# Patient Record
Sex: Female | Born: 1950
Health system: Southern US, Community
[De-identification: ages and names within clinical notes are randomized; demographics above are authoritative.]

## PROBLEM LIST (undated history)

## (undated) DIAGNOSIS — K635 Polyp of colon: Secondary | ICD-10-CM

## (undated) DIAGNOSIS — Z9889 Other specified postprocedural states: Secondary | ICD-10-CM

## (undated) DIAGNOSIS — R112 Nausea with vomiting, unspecified: Secondary | ICD-10-CM

## (undated) DIAGNOSIS — G4700 Insomnia, unspecified: Secondary | ICD-10-CM

## (undated) DIAGNOSIS — D3910 Neoplasm of uncertain behavior of unspecified ovary: Secondary | ICD-10-CM

## (undated) DIAGNOSIS — R7989 Other specified abnormal findings of blood chemistry: Secondary | ICD-10-CM

## (undated) DIAGNOSIS — K5790 Diverticulosis of intestine, part unspecified, without perforation or abscess without bleeding: Secondary | ICD-10-CM

## (undated) DIAGNOSIS — N950 Postmenopausal bleeding: Secondary | ICD-10-CM

## (undated) DIAGNOSIS — F419 Anxiety disorder, unspecified: Secondary | ICD-10-CM

## (undated) HISTORY — PX: FINGER SURGERY: SHX640

## (undated) HISTORY — DX: Polyp of colon: K63.5

## (undated) HISTORY — DX: Neoplasm of uncertain behavior of unspecified ovary: D39.10

## (undated) HISTORY — DX: Other specified abnormal findings of blood chemistry: R79.89

## (undated) HISTORY — DX: Insomnia, unspecified: G47.00

## (undated) HISTORY — DX: Diverticulosis of intestine, part unspecified, without perforation or abscess without bleeding: K57.90

## (undated) HISTORY — DX: Postmenopausal bleeding: N95.0

---

## 2000-08-11 ENCOUNTER — Encounter: Admission: RE | Admit: 2000-08-11 | Discharge: 2000-08-11 | Payer: Self-pay | Admitting: Internal Medicine

## 2000-08-11 ENCOUNTER — Encounter: Payer: Self-pay | Admitting: Internal Medicine

## 2000-09-23 ENCOUNTER — Encounter (INDEPENDENT_AMBULATORY_CARE_PROVIDER_SITE_OTHER): Payer: Self-pay

## 2000-09-23 ENCOUNTER — Ambulatory Visit (HOSPITAL_COMMUNITY): Admission: RE | Admit: 2000-09-23 | Discharge: 2000-09-23 | Payer: Self-pay | Admitting: Gastroenterology

## 2001-11-03 ENCOUNTER — Other Ambulatory Visit: Admission: RE | Admit: 2001-11-03 | Discharge: 2001-11-03 | Payer: Self-pay | Admitting: *Deleted

## 2002-11-16 ENCOUNTER — Other Ambulatory Visit: Admission: RE | Admit: 2002-11-16 | Discharge: 2002-11-16 | Payer: Self-pay | Admitting: Obstetrics and Gynecology

## 2010-04-18 ENCOUNTER — Encounter: Admission: RE | Admit: 2010-04-18 | Discharge: 2010-04-18 | Payer: Self-pay | Admitting: Obstetrics and Gynecology

## 2010-07-15 DIAGNOSIS — G4700 Insomnia, unspecified: Secondary | ICD-10-CM

## 2010-07-15 HISTORY — DX: Insomnia, unspecified: G47.00

## 2010-08-29 ENCOUNTER — Ambulatory Visit (INDEPENDENT_AMBULATORY_CARE_PROVIDER_SITE_OTHER): Payer: 59 | Admitting: Family Medicine

## 2010-08-29 DIAGNOSIS — M542 Cervicalgia: Secondary | ICD-10-CM

## 2010-08-29 DIAGNOSIS — G4700 Insomnia, unspecified: Secondary | ICD-10-CM

## 2010-11-30 NOTE — Procedures (Signed)
[facility]  Patient:    Kristie Howell, Kristie Howell                         MRN: [id number] Proc. Date: 09/23/00 Adm. Date:  [id number] Attending:  Nelda Marseille CC:         Luanna Cole. Lenord Fellers, M.D.   Procedure Report  PROCEDURE PERFORMED:  Colonoscopy.  ENDOSCOPIST:  Petra Kuba, M.D.  INDICATIONS:  Family history of colon cancer in a patient with probable IBS. Consent was signed after risks, benefits, and options were thoroughly discussed in the office.  MEDICATIONS:  Fentanyl 100 mcg and Versed 12 mg.  DESCRIPTION OF PROCEDURE:  Rectal inspection is pertinent for external hemorrhoids.  Digital exam was negative.  Video pediatric colonoscope was inserted and with moderate difficulty due to a tortuous colon was able to be advanced to the cecum.  No obvious abnormality was seen on insertion.  To advance to the cecum required rolling her on her back and then back on her left side and then back on her back again, and multiple abdominal pressures. The cecum was identified by the appendiceal orifice and the ileocecal valve. The scope was inserted a short ways in the terminal ileum, which was normal. Photo documentation was obtained.  The scope was then slowly withdrawn through the colon.  The prep was adequate.  There was minimal liquid stool that required suctioning only.  When we fell back around the tortuous loop, we did try to readvance around the curves, slowly withdrawal to the rectum.  No obvious abnormalities were seen except for in the rectum a tiny hypoplastic appearing polyp was seen and cold biopsied x 2.  Once back in the rectum, the scope was retroflexed and pertinent for some internal hemorrhoids.  The scope was drained and readvanced a short ways around the sigmoid area, was suctioned, and the scope removed.  The patient tolerated the procedure well. There was no obvious immediate complications.  ENDOSCOPIC DIAGNOSES:  1. Internal and external  hemorrhoids.  2. Pending rectal polyp probable hyperplastic status post cold biopsy.  3. Tortuous colon.  4. Otherwise within normal limits to the terminal ileum.  PLAN:  1. Await pathology.  2. Probably recheck colon screening in five years. 2. Gastrointestinal follow up p.r.n.  3. Will wait and see how the Robinul works.  4. Yearly rectals and guaiacs per Dr. Lenord Fellers or oncologist as well as other health care maintenance. DD:  09/23/00 TD:  09/24/00 Job: [id number]

## 2010-12-05 ENCOUNTER — Telehealth: Payer: Self-pay | Admitting: Family Medicine

## 2010-12-05 NOTE — Telephone Encounter (Signed)
Since this is a controlled substance and can't be e-rx'd, please fax back the signed form on the chart.  Thanks Ok for #20, no refills

## 2010-12-06 NOTE — Telephone Encounter (Signed)
FAXED REFILL FORM TO [facility]

## 2011-01-13 LAB — HM COLONOSCOPY

## 2011-02-13 DIAGNOSIS — K5790 Diverticulosis of intestine, part unspecified, without perforation or abscess without bleeding: Secondary | ICD-10-CM

## 2011-02-13 DIAGNOSIS — K635 Polyp of colon: Secondary | ICD-10-CM

## 2011-02-13 HISTORY — DX: Polyp of colon: K63.5

## 2011-02-13 HISTORY — DX: Diverticulosis of intestine, part unspecified, without perforation or abscess without bleeding: K57.90

## 2011-03-12 ENCOUNTER — Other Ambulatory Visit: Payer: Self-pay | Admitting: Family Medicine

## 2011-03-12 DIAGNOSIS — G4700 Insomnia, unspecified: Secondary | ICD-10-CM

## 2011-03-13 MED ORDER — ZOLPIDEM TARTRATE 10 MG PO TABS: ORAL_TABLET | ORAL | Status: DC

## 2011-03-13 NOTE — Telephone Encounter (Signed)
Ok for #20, rf x 1 to [facility].  Please call in and sign Rx order

## 2011-03-13 NOTE — Telephone Encounter (Signed)
Called in zolpidem tartrate 10mg  #20 1/2-1 tablet @ bedtime prn with 1 RF to [facility] @ [phone].

## 2011-10-08 ENCOUNTER — Ambulatory Visit (INDEPENDENT_AMBULATORY_CARE_PROVIDER_SITE_OTHER): Payer: PRIVATE HEALTH INSURANCE | Admitting: Obstetrics and Gynecology

## 2011-10-08 ENCOUNTER — Encounter: Payer: Self-pay | Admitting: Obstetrics and Gynecology

## 2011-10-08 VITALS — BP 120/76 | Ht 65.0 in | Wt 130.0 lb

## 2011-10-08 DIAGNOSIS — N950 Postmenopausal bleeding: Secondary | ICD-10-CM

## 2011-10-08 DIAGNOSIS — Z01419 Encounter for gynecological examination (general) (routine) without abnormal findings: Secondary | ICD-10-CM

## 2011-10-08 NOTE — Progress Notes (Signed)
Patient is a 61 year old gravida 2 para 2 AB 0 who came to see me today as a new patient because of postmenopausal bleeding. In 2010 she started menopause. In January of 2011 because of significant symptoms Dr. Rosalio Macadamia started her on a Vivelle dot patch 0.05 mg twice a week and Prometrium 100 mg daily. All her symptoms were resolved and she had no bleeding. 18 months later she had some abnormal bleeding. She went to the office and had a normal ultrasound with a thin lining. She continued to have intermittent spotting and she had a followup ultrasound in January of 2013. At this point she was having fairly significant cramping not always associated with the bleeding. Ultrasound was still normal and she still had a thin endometrial stripe. When the bleeding and cramping persisted 2 weeks ago she was switched to a 0.0375 mg Vivelle patch and she has noticed significant improvement in the bleeding. She's never had an endometrial biopsy and she's never had a saline infusion histogram. She says the new patches okay but not as good as the old ones. She is also having more vaginal dryness. [facility] is sending her ultrasounds to me.   Physical examination: Kennon Portela present. HEENT within normal limits. Neck: Thyroid not large. No masses. Supraclavicular nodes: not enlarged. Breasts: Examined in both sitting and lying  position. No skin changes and no masses. Abdomen: Soft no guarding rebound or masses or hernia. Pelvic: External: Within normal limits. BUS: Within normal limits. Vaginal:within normal limits. Good estrogen effect. No evidence of cystocele rectocele or enterocele. Cervix: clean. Uterus: Normal size and shape. Adnexa: No masses. Rectovaginal exam: Confirmatory and negative. Extremities: Within normal limits.  Assessment: Persistent postmenopausal bleeding  Plan: We attempted to do an endometrial biopsy. Patient has a severely retroverted uterus and his had cesarean sections for her  deliveries and we were not successful. We plan to have her return for saline infusion histogram with endometrial biopsy using a paracervical block. She will take 2 naprelan (1000mg m) before she calms. Other option giving to her was hysteroscopy D&C at the surgical center. We also discussed if the bleeding persists on the  lower dose or she feels she has to go to the higher dose of estrogen because of symptoms using a Mirena IUD or doing an endometrial ablation. She will let me know how she wants to proceed.

## 2011-10-09 LAB — URINALYSIS W MICROSCOPIC + REFLEX CULTURE
Bacteria, UA: NONE SEEN
Bilirubin Urine: NEGATIVE
Casts: NONE SEEN
Glucose, UA: NEGATIVE mg/dL
Ketones, ur: NEGATIVE mg/dL
Nitrite: NEGATIVE
Protein, ur: NEGATIVE mg/dL
Specific Gravity, Urine: 1.018 (ref 1.005–1.030)
Urobilinogen, UA: 0.2 mg/dL (ref 0.0–1.0)
pH: 6.5 (ref 5.0–8.0)

## 2011-10-10 LAB — URINE CULTURE
Colony Count: NO GROWTH
Organism ID, Bacteria: NO GROWTH

## 2011-10-28 ENCOUNTER — Other Ambulatory Visit: Payer: Self-pay | Admitting: *Deleted

## 2011-10-28 ENCOUNTER — Telehealth: Payer: Self-pay | Admitting: *Deleted

## 2011-10-28 DIAGNOSIS — N950 Postmenopausal bleeding: Secondary | ICD-10-CM

## 2011-10-28 NOTE — Telephone Encounter (Signed)
Pt called to have [facility]  And endometrial biopsy schedule, pt transferred to appointment desk.

## 2011-10-30 ENCOUNTER — Encounter: Payer: Self-pay | Admitting: Family Medicine

## 2011-10-30 ENCOUNTER — Ambulatory Visit (INDEPENDENT_AMBULATORY_CARE_PROVIDER_SITE_OTHER): Payer: PRIVATE HEALTH INSURANCE | Admitting: Family Medicine

## 2011-10-30 VITALS — BP 108/70 | HR 72 | Ht 64.0 in | Wt 130.0 lb

## 2011-10-30 DIAGNOSIS — M79609 Pain in unspecified limb: Secondary | ICD-10-CM

## 2011-10-30 DIAGNOSIS — R300 Dysuria: Secondary | ICD-10-CM

## 2011-10-30 DIAGNOSIS — M545 Low back pain, unspecified: Secondary | ICD-10-CM

## 2011-10-30 DIAGNOSIS — G4700 Insomnia, unspecified: Secondary | ICD-10-CM | POA: Insufficient documentation

## 2011-10-30 DIAGNOSIS — M79671 Pain in right foot: Secondary | ICD-10-CM

## 2011-10-30 LAB — POCT URINALYSIS DIPSTICK
Glucose, UA: NEGATIVE
Ketones, UA: NEGATIVE
Spec Grav, UA: 1.01

## 2011-10-30 MED ORDER — NAPROXEN 500 MG PO TABS: 500.0000 mg | ORAL_TABLET | Freq: Two times a day (BID) | ORAL | Status: DC

## 2011-10-30 MED ORDER — ZOLPIDEM TARTRATE 10 MG PO TABS: ORAL_TABLET | ORAL | Status: DC

## 2011-10-30 NOTE — Patient Instructions (Signed)
Take anti-inflammatory medication twice daily regularly until pain has resolved.  Heat and stretches will help.  You may use tylenol, but no other pain medications while taking the prescription.  Cut back on dose if bothering your stomach, and ensure to take it with food.  If having ongoing back pain, consider physical therapy or chiropractor (I have used Dr. Thereasa Distance at [facility] on [address] near [place])  Foot pain is not likely related to your back pain.  Supportive shoes with good arch supports.  Consider seeing podiatrist if ongoing foot pain ([facility]).   Sacroiliac Joint Dysfunction The sacroiliac joint connects the lower part of the spine (the sacrum) with the bones of the pelvis. CAUSES  Sometimes, there is no obvious reason for sacroiliac joint dysfunction. Other times, it may occur   During pregnancy.   After injury, such as:   Car accidents.   Sport-related injuries.   Work-related injuries.   Due to one leg being shorter than the other.   Due to other conditions that affect the joints, such as:   Rheumatoid arthritis.   Gout.   Psoriasis.   Joint infection (septic arthritis).  SYMPTOMS  Symptoms may include:  Pain in the:   Lower back.   Buttocks.   Groin.   Thighs and legs.   Difficult sitting, standing, walking, lying, bending or lifting.  DIAGNOSIS  A number of tests may be used to help diagnose the cause of sacroiliac joint dysfunction, including:  Imaging tests to look for other causes of pain, including:   MRI.   CT scan.   Bone scan.   Diagnostic injection: During a special x-ray (called fluoroscopy), a needle is put into the sacroiliac joint. A numbing medicine is injected into the joint. If the pain is improved or stopped, the diagnosis of sacroiliac joint dysfunction is more likely.  TREATMENT  There are a number of types of treatment used for sacroiliac joint dysfunction,  including:  Only take over-the-counter or prescription medicines for pain, discomfort, or fever as directed by your caregiver.   Medications to relax muscles.   Rest. Decreasing activity can help cut down on painful muscle spasms and allow the back to heal.   Application of heat or ice to the lower back may improve muscle spasms and soothe pain.   Brace. A special back brace, called a sacroiliac belt, can help support the joint while your back is healing.   Physical therapy can help teach comfortable positions and exercises to strengthen muscles that support the sacroiliac joint.   Cortisone injections. Injections of steroid medicine into the joint can help decrease swelling and improve pain.   Hyaluronic acid injections. This chemical improves lubrication within the sacroiliac joint, thereby decreasing pain.   Radiofrequency ablation. A special needle is placed into the joint, where it burns away nerves that are carrying pain messages from the joint.   Surgery. Because pain occurs during movement of the joint, screws and plates may be installed in order to limit or prevent joint motion.  HOME CARE INSTRUCTIONS   Take all medications exactly as directed.   Follow instructions regarding both rest and physical activity, to avoid worsening the pain.   Do physical therapy exercises exactly as prescribed.  SEEK IMMEDIATE MEDICAL CARE IF:  You experience increasingly severe pain.   You develop new symptoms, such as numbness or tingling in your legs or feet.   You lose bladder or bowel control.  Document Released:  09/27/2008 Document Revised: 06/20/2011 Document Reviewed: 09/27/2008 [facility] Patient Information 2012 [place], [place].

## 2011-10-30 NOTE — Progress Notes (Signed)
Chief complaint:  Foot pain --patient thinks may be caused by sciatic nerve, mainly pain in her right foot but also experiences some right hip pain at times. Also stated she has some burning upon urination. Would like ambien refill  HPI:  Complaining of R hip/buttock pain on/off since January.  Sometimes the pain shoots anteriorly to hip.  Pain is worse with sitting, and sometimes has pain down the back of the leg.  Also has pain in R foot, across the bottom of all of the metatarsals, and midfoot across the arch at bottom of foot.  Some tingling across the bottom of foot at metatarsal heads. At the end of the day, she sometimes has pain in the mid-back, no other back pain.    Has tried motrin, advil, aleve and tylenol, and nothing helps, but has only taken meds once a day.  Denies any change in activity, injury or other possible cause for pain.    Occasionally has burning with urination; it is intermittent.  It was bad 2 nights ago, but only very slight the last 2 days.  Denies any urgency or frequency.  Insomnia--this was discussed at her last (and only) visit,  In 08/2010, at which time we gave her trial of Ambien. The Ambien was very effective for her. Uses it at most twice weekly, usually just 1/2 tablet.  Some nights she sleeps fine without any medication, other times she goes through spells of having trouble sleeping, and the ambien really helps.  Mother with postherpetic neuralgia, asking about whether or not she should get shingles vaccine before the age of 18  Past Medical History  Diagnosis Date  . PMB (postmenopausal bleeding)   . Hemorrhoids     internal/external seen on colonoscopy 02/2011  . Diverticulosis 02/2011  . Hyperplastic colon polyp 02/2011  . Insomnia 2012  . Abnormal TSH     borderline; followed by Dr. Talmage Nap    Past Surgical History  Procedure Date  . Cesarean section     History   Social History  . Marital Status: Married    Spouse Name: N/A    Number of  Children: 2  . Years of Education: N/A   Occupational History  . homemaker    Social History Main Topics  . Smoking status: Never Smoker   . Smokeless tobacco: Never Used  . Alcohol Use: 1.0 oz/week    2 drink(s) per week     one drink per week.  . Drug Use: No  . Sexually Active: Yes -- Female partner(s)    Birth Control/ Protection: Post-menopausal   Other Topics Concern  . Not on file   Social History Narrative   Lives with husband.  Son in [place], daughter in August, [place]    Family History  Problem Relation Age of Onset  . Thyroid disease Mother   . Melanoma Mother   . Cancer Father 58    Colon cancer  . Heart attack Maternal Grandmother   . Ovarian cancer Paternal Grandmother    Current Outpatient Prescriptions on File Prior to Visit  Medication Sig Dispense Refill  . Calcium Carbonate-Vitamin D (CALCIUM + D PO) Take by mouth.      . estradiol (VIVELLE-DOT) 0.0375 MG/24HR Place 1 patch onto the skin 2 (two) times a week.      . fish oil-omega-3 fatty acids 1000 MG capsule Take 1 g by mouth daily.      . progesterone (PROMETRIUM) 100 MG capsule Take 100 mg by mouth  daily.      Marland Kitchen DISCONTD: zolpidem (AMBIEN) 10 MG tablet 1/2 - 1 tablet orally at bedtime as needed for insomnia  20 tablet  1    Allergies  Allergen Reactions  . Sulfa Antibiotics Swelling and Rash   ROS:  Postmenopausal bleeding, under care of Dr. Eda Paschal.  Has been on HRT, had u/s and considering EMB next week. Denies fevers, URI or allergy symptoms, chest pain, shortness of breath, edema or other concerns.  PHYSICAL EXAM: BP 108/70  Pulse 72  Ht 5\' 4"  (1.626 m)  Wt 130 lb (58.968 kg)  BMI 22.31 kg/m2 Well developed, pleasant female in no distress Neck: no lymphadenopathy Spine: nontender.  No CVA tenderness Tender at right SI joint.  No significant pyriformis spasm.  Negative SLR.  Metatarsal heads nontender (pt states they do get sore when having pain, not having pain now).  Mild tenderness at  plantar fascia insertion at calcaneous (h/o PF in past) Skin: no rash/lesions Neuro: alert and oriented x 3, normal strength, sensation, 2+ DTR's, normal gait  Urine dip: trace leukocytes otherwise normal  ASSESSMENT/PLAN: 1. Lumbago  naproxen (NAPROSYN) 500 MG tablet   pain at R SI joint  2. Burning with urination  POCT Urinalysis Dipstick, Urine culture  3. Insomnia  zolpidem (AMBIEN) 10 MG tablet  4. Foot pain, right     some component of plantar fasciitis, and of metatarsalgia.  f/u with podiatrist if not improving with NSAIDs   Foot pain--not consistent with neuropathy.  Trial of supportive shoes, arch supports, avoid heels.  Back pain--due to SI joint.  Heat, NSAIDs, stretches.  Consider PT or chiro if not improving.  Dysuria--given symptoms and trace leuks on dip, will send for urine culture, and treat if abnormal.  Insomnia--intermittent.  Does well with ambien as needed,  not using too frequently.  Refill #20 with 1 refill.  Risks/benefits of zostavax reviewed.  Check insurance coverage and if covered <60 can return for nurse visit.  Otherwise, likely is fine to wait until 62.

## 2011-11-02 LAB — URINE CULTURE: Colony Count: >=100000

## 2011-11-05 ENCOUNTER — Other Ambulatory Visit (INDEPENDENT_AMBULATORY_CARE_PROVIDER_SITE_OTHER): Payer: PRIVATE HEALTH INSURANCE

## 2011-11-05 DIAGNOSIS — R300 Dysuria: Secondary | ICD-10-CM

## 2011-11-05 LAB — POCT URINALYSIS DIPSTICK
Bilirubin, UA: NEGATIVE
Glucose, UA: NEGATIVE
Ketones, UA: NEGATIVE
Spec Grav, UA: 1.02

## 2011-11-06 ENCOUNTER — Other Ambulatory Visit: Payer: Self-pay | Admitting: Obstetrics and Gynecology

## 2011-11-06 ENCOUNTER — Ambulatory Visit (INDEPENDENT_AMBULATORY_CARE_PROVIDER_SITE_OTHER): Payer: PRIVATE HEALTH INSURANCE | Admitting: Obstetrics and Gynecology

## 2011-11-06 ENCOUNTER — Ambulatory Visit (INDEPENDENT_AMBULATORY_CARE_PROVIDER_SITE_OTHER): Payer: PRIVATE HEALTH INSURANCE

## 2011-11-06 DIAGNOSIS — Q5128 Other doubling of uterus, other specified: Secondary | ICD-10-CM

## 2011-11-06 DIAGNOSIS — N83339 Acquired atrophy of ovary and fallopian tube, unspecified side: Secondary | ICD-10-CM

## 2011-11-06 DIAGNOSIS — D251 Intramural leiomyoma of uterus: Secondary | ICD-10-CM

## 2011-11-06 DIAGNOSIS — N854 Malposition of uterus: Secondary | ICD-10-CM

## 2011-11-06 DIAGNOSIS — N949 Unspecified condition associated with female genital organs and menstrual cycle: Secondary | ICD-10-CM

## 2011-11-06 DIAGNOSIS — R102 Pelvic and perineal pain unspecified side: Secondary | ICD-10-CM

## 2011-11-06 DIAGNOSIS — D219 Benign neoplasm of connective and other soft tissue, unspecified: Secondary | ICD-10-CM

## 2011-11-06 DIAGNOSIS — N882 Stricture and stenosis of cervix uteri: Secondary | ICD-10-CM

## 2011-11-06 DIAGNOSIS — N950 Postmenopausal bleeding: Secondary | ICD-10-CM

## 2011-11-06 DIAGNOSIS — D259 Leiomyoma of uterus, unspecified: Secondary | ICD-10-CM

## 2011-11-06 DIAGNOSIS — Q5122 Other partial doubling of uterus: Secondary | ICD-10-CM

## 2011-11-06 MED ORDER — LIDOCAINE HCL 1 % IJ SOLN
20.0000 mL | Freq: Once | INTRAMUSCULAR | Status: AC
  Administered 2011-11-06: 20 mL

## 2011-11-06 NOTE — Progress Notes (Signed)
Patient came back today for an ultrasound and saline infusion histogram because of an appropriate postmenopausal bleeding. Her previous ultrasounds from [facility] reviewed. She says that since she lowered the strength of the patch and added ansaids for her hip she has had no bleeding. The systemic symptoms are tolerable on the lower dose. The vaginal dryness however is not. We then did an ultrasound. Her uterus is severely retroverted with a subseptated uterus. The right endometrial cavity is 4.5 mm and  the left endometrial cavity is 5 mm. The patient has 2 small myomas of 2 cm each. Both her ovaries are normal. Her cul-de-sac has a moderate amount of fluid. The patient has cervical stenosis consistent with postmenopausal state and no vaginal deliveries. A paracervical block was placed with 20 cc of 1% plain Xylocaine. The cervix was dilated. A catheter was inserted into the uterus. The cavity expanded partially due to uterine contractions causing the fluid to go back into the vagina. No intrauterine cavity defects could be appreciated. Endometrial biopsy was obtained.  Assessment: Postmenopausal bleeding. Subseptated uterus. Fibroids.  Plan: We will call the patient with the endometrial biopsy. She will stay on the .0375 mg patch. We added  Vagifem 10 MCG's BIW. If she has more inappropriate bleeding proceed with hysteroscopy.

## 2011-11-07 ENCOUNTER — Telehealth: Payer: Self-pay | Admitting: *Deleted

## 2011-11-07 NOTE — Telephone Encounter (Signed)
Pt informed with the below note, she will follow up if symptoms should reoccur.

## 2011-11-07 NOTE — Telephone Encounter (Signed)
I suspect it was related to the study. If she is having no more episodes she should not be concerned. If she has future episodes I. Would like to see her.

## 2011-11-07 NOTE — Telephone Encounter (Signed)
Pt had endometrial BX yesterday and last night experienced some vomiting, diarrhea, severe cramping, took 2 tylenol for cramping. No fever, pt feels weak this am, no more episodes of the above, drinking fluids Pt would like to know if she should be concerned? Please advise

## 2011-11-08 ENCOUNTER — Telehealth: Payer: Self-pay | Admitting: *Deleted

## 2011-11-08 ENCOUNTER — Encounter: Payer: Self-pay | Admitting: *Deleted

## 2011-11-08 NOTE — Telephone Encounter (Signed)
Patient informed appt set with Dr. Duard Brady in [place] on 11/13/11 @ 1:30pm.

## 2011-11-09 LAB — URINE CULTURE: Colony Count: 30000

## 2011-11-13 ENCOUNTER — Ambulatory Visit: Payer: PRIVATE HEALTH INSURANCE | Attending: Gynecologic Oncology | Admitting: Gynecologic Oncology

## 2011-11-13 ENCOUNTER — Encounter: Payer: Self-pay | Admitting: Gynecologic Oncology

## 2011-11-13 VITALS — BP 120/62 | HR 80 | Temp 98.2°F | Resp 18 | Ht 65.04 in | Wt 128.9 lb

## 2011-11-13 DIAGNOSIS — C549 Malignant neoplasm of corpus uteri, unspecified: Secondary | ICD-10-CM | POA: Insufficient documentation

## 2011-11-13 DIAGNOSIS — C541 Malignant neoplasm of endometrium: Secondary | ICD-10-CM

## 2011-11-13 HISTORY — PX: ABDOMINAL HYSTERECTOMY: SHX81

## 2011-11-13 NOTE — Patient Instructions (Signed)
Follow up for pre-operative visit

## 2011-11-13 NOTE — Progress Notes (Signed)
Consult Note: Gyn-Onc  Kristie Howell 61 y.o. female  CC:  Chief Complaint  Patient presents with  . Endometrial Adeno    New pt    HPI: Patient is seen today in consultation at the request of Dr. Eda Paschal. Patient is a very pleasant 61 year old gravida 2 para 2 (12 are ages 69 and 78) was menopause about 2-1/2 years ago. She was on a combination hormone replacement therapy consisting of Vivelle-Dot we daily Prometrium. She states that this past summer she started having some vaginal bleeding and reported this to her previous gynecologist who performed and after sound that was unremarkable. She was reassured and or supplements to 4 months. She had recurrent bleeding in January of this year reported this to your gynecologist has similar performed a repeat ultrasound that was reassuring and was recommended that she change her hormone replacement therapy. She was uncomfortable with this and she went to see Dr. Eda Paschal.  He performed an ultrasound in saline effusion hysterogram they reveal a retroverted sac septated uterus with a right endometrial cavity measuring 4.5 mm in the left endometrial cavity measuring 5 mm. A biopsy was performed it revealed complex atypical hyperplasia and focal endometrioid adenocarcinoma grade 1 she comes in today for evaluation of the above.  Interval History:   Review of Systems: She denies recurrent bleeding, any change in her bowel or bladder habits. She denies any abdominal or pelvic pain. She does have some right hip and sciatic pain. She was taking Naprosyn for about a week and her pain did decrease but it is intermittent. Dr. even at this her primary care physician. Denies any chest pain shortness of breath change in bowel bladder habits unintentional weight loss or weight gain. 10 point review of systems is essentially negative.  Current Meds:  Outpatient Encounter Prescriptions as of 11/13/2011  Medication Sig Dispense Refill  . Calcium Carbonate-Vitamin D  (CALCIUM + D PO) Take by mouth daily.       . fish oil-omega-3 fatty acids 1000 MG capsule Take 1 g by mouth 2 (two) times daily.       Marland Kitchen zolpidem (AMBIEN) 10 MG tablet 1/2 - 1 tablet orally at bedtime as needed for insomnia  20 tablet  1  . estradiol (VIVELLE-DOT) 0.0375 MG/24HR Place 1 patch onto the skin 2 (two) times a week.      . naproxen (NAPROSYN) 500 MG tablet Take 1 tablet (500 mg total) by mouth 2 (two) times daily with a meal.  30 tablet  0  . progesterone (PROMETRIUM) 100 MG capsule Take 100 mg by mouth daily.        Allergy:  Allergies  Allergen Reactions  . Sulfa Antibiotics Swelling and Rash    Social Hx:   History   Social History  . Marital Status: Married    Spouse Name: N/A    Number of Children: 2  . Years of Education: N/A   Occupational History  . homemaker    Social History Main Topics  . Smoking status: Never Smoker   . Smokeless tobacco: Never Used  . Alcohol Use: 1.0 oz/week    2 drink(s) per week     one drink per week.  . Drug Use: No  . Sexually Active: Yes -- Female partner(s)    Birth Control/ Protection: Post-menopausal   Other Topics Concern  . Not on file   Social History Narrative   Lives with husband.  Son in [place], daughter in [place], [place]  Past Surgical Hx:  Past Surgical History  Procedure Date  . Cesarean section 1985 1987    x2    Past Medical Hx:  Past Medical History  Diagnosis Date  . PMB (postmenopausal bleeding)   . Hemorrhoids     internal/external seen on colonoscopy 02/2011  . Diverticulosis 02/2011  . Hyperplastic colon polyp 02/2011  . Insomnia 2012  . Abnormal TSH     borderline; followed by Dr. Talmage Nap    Family Hx:  Family History  Problem Relation Age of Onset  . Thyroid disease Mother   . Melanoma Mother   . Cancer Father 8    Colon cancer  . Heart attack Maternal Grandmother   . Ovarian cancer Paternal Grandmother     Vitals:  Blood pressure 120/62, pulse 80, temperature 98.2 F (36.8 C),  resp. rate 18, height 5' 5.04" (1.652 m), weight 128 lb 14.4 oz (58.469 kg).  Physical Exam: Well-nourished well-developed female in no acute distress.  Neck: Supple no lymphadenopathy no diamond repair  Lungs: Clear to auscultation bilaterally.  Cardiovascular: Regular rate and rhythm  Abdomen: Soft, nontender, nondistended. She is a well-healed transverse skin incision. There is no hepatosplenomegaly. There is no rebound or guarding.  Groins: No lymphadenopathy.  Extremities: No edema.  Pelvic: Normal external female genitalia. Vagina is atrophic. The cervix is visualized is nulliparous. There is no gross visible lesions. Bimanual examination reveals a retroflexed uterus. It is of normal size shape and consistency. There are no adnexal masses. Rectal confirms no nodularity.  Assessment/Plan: 61 year old gravida 2 para 2 with complex atypical hyperplasia and focal grade 1 endometrial carcinoma. We discussed treatment options with her husband. After discussion which included surgical management we will proceed with a total robotic hysterectomy bilateral salpingo-oophorectomy. The uterus be sent for frozen section. Pending the results of the frozen section and depth of myometrial invasion will proceed with lymphadenectomy. She had questions regarding lymphedema which we discussed and I quoted her a 5% lymphedema risk.   She understands the risks her including but not limited to bleeding, infection, injury to surrounding organs, thromboembolic disease, need to proceed with laparotomy, vaginal cuff separation, and additional surgery. Their questions were elicited in answer to her satisfaction. They're tentatively looking at a surgical date of May 28. They have my card as well as that of our staff she knows she can call us if she has any questions. She is also concerned about anesthesia. She would like to opportunity to speak with the anesthesiologist before surgery secondary to her fears.   She  and her husband both understand that any adjuvant treatment will be based on final pathology and that that is not information that we currently have available.  However, based on her preoperative biopsy primarily joint complex atypical hyperplasia with only a focus of grade 1 endometrial carcinoma I believe that there is a high likelihood that this will be all the treatment that she requires. We briefly discussed the followup plan and her primary gynecologist will be involved in her follow up.  Cleda Mccreedy A., MD 11/13/2011, 3:43 PM

## 2011-11-28 ENCOUNTER — Encounter (HOSPITAL_COMMUNITY): Payer: Self-pay | Admitting: Pharmacy Technician

## 2011-12-04 ENCOUNTER — Encounter (HOSPITAL_COMMUNITY)
Admission: RE | Admit: 2011-12-04 | Discharge: 2011-12-04 | Disposition: A | Discharge status: Home / Self Care | Payer: PRIVATE HEALTH INSURANCE | Source: Ambulatory Visit | Attending: Obstetrics & Gynecology | Admitting: Obstetrics & Gynecology

## 2011-12-04 ENCOUNTER — Other Ambulatory Visit: Payer: Self-pay

## 2011-12-04 ENCOUNTER — Encounter (HOSPITAL_COMMUNITY): Payer: Self-pay

## 2011-12-04 DIAGNOSIS — F419 Anxiety disorder, unspecified: Secondary | ICD-10-CM

## 2011-12-04 HISTORY — DX: Anxiety disorder, unspecified: F41.9

## 2011-12-04 LAB — COMPREHENSIVE METABOLIC PANEL
ALT: 23 U/L (ref 0–35)
AST: 21 U/L (ref 0–37)
Alkaline Phosphatase: 65 U/L (ref 39–117)
CO2: 27 mEq/L (ref 19–32)
Calcium: 9.9 mg/dL (ref 8.4–10.5)
Potassium: 4.6 mEq/L (ref 3.5–5.1)
Sodium: 140 mEq/L (ref 135–145)
Total Protein: 7.8 g/dL (ref 6.0–8.3)

## 2011-12-04 LAB — DIFFERENTIAL
Eosinophils Relative: 1 % (ref 0–5)
Lymphocytes Relative: 18 % (ref 12–46)
Lymphs Abs: 1.1 10*3/uL (ref 0.7–4.0)
Monocytes Absolute: 0.3 10*3/uL (ref 0.1–1.0)

## 2011-12-04 LAB — CBC
MCH: 29.8 pg (ref 26.0–34.0)
MCHC: 34.4 g/dL (ref 30.0–36.0)
Platelets: 203 10*3/uL (ref 150–400)
RBC: 5.04 MIL/uL (ref 3.87–5.11)

## 2011-12-04 NOTE — Progress Notes (Signed)
12-04-11 charted in error

## 2011-12-04 NOTE — Patient Instructions (Addendum)
[address] Krissa Utke Skipper  12/04/2011   Your procedure is scheduled on:  5-28 -2013  Report to [facility] at     0830   AM.  Call this number if you have problems the morning of surgery: [phone]   Remember:   Do not eat food:After Midnight.  Follow instructions for Clear liquids include soda, tea, black coffee, apple or grape juice, broth. All the day before surgery starting 0700 AM until 12 midnight.  Take these medicines the morning of surgery with A SIP OF WATER: none.   Do not wear jewelry, make-up or nail polish.  Do not wear lotions, powders, or perfumes. You may wear deodorant.  Do not shave 48 hours prior to surgery.(face and neck okay, no shaving of legs)  Do not bring valuables to the hospital.  Contacts, dentures or bridgework may not be worn into surgery.  Leave suitcase in the car. After surgery it may be brought to your room.  For patients admitted to the hospital, checkout time is 11:00 AM the day of discharge.   Patients discharged the day of surgery will not be allowed to drive home.  Name and phone number of your driver: spouse  Special Instructions: CHG Shower Use Special Wash: 1/2 bottle night before surgery and 1/2 bottle morning of surgery.(avoid face and genitals)   Please read over the following fact sheets that you were given: MRSA Information, Blood Transfusion fact sheet, Incentive Spirometry.

## 2011-12-04 NOTE — Progress Notes (Signed)
Charted in error.Kristie Howell

## 2011-12-04 NOTE — Pre-Procedure Instructions (Addendum)
12-04-11 Pt. Heart rate rapid-EKG done.Will do T/S  AM of-pt. Requests not to wear-going out of town. Teach Janese Banks method used for preop instructions.

## 2011-12-10 ENCOUNTER — Encounter (HOSPITAL_COMMUNITY): Payer: Self-pay | Admitting: Anesthesiology

## 2011-12-10 ENCOUNTER — Ambulatory Visit (HOSPITAL_COMMUNITY)
Admission: RE | Admit: 2011-12-10 | Discharge: 2011-12-11 | Disposition: A | Discharge status: Home / Self Care | Payer: PRIVATE HEALTH INSURANCE | Source: Ambulatory Visit | Attending: Obstetrics & Gynecology | Admitting: Obstetrics & Gynecology

## 2011-12-10 ENCOUNTER — Encounter (HOSPITAL_COMMUNITY)
Admission: RE | Disposition: A | Discharge status: Home / Self Care | Payer: Self-pay | Source: Ambulatory Visit | Attending: Obstetrics & Gynecology

## 2011-12-10 ENCOUNTER — Ambulatory Visit (HOSPITAL_COMMUNITY): Payer: PRIVATE HEALTH INSURANCE | Admitting: Anesthesiology

## 2011-12-10 ENCOUNTER — Encounter (HOSPITAL_COMMUNITY): Payer: Self-pay

## 2011-12-10 DIAGNOSIS — C549 Malignant neoplasm of corpus uteri, unspecified: Secondary | ICD-10-CM | POA: Insufficient documentation

## 2011-12-10 DIAGNOSIS — N9489 Other specified conditions associated with female genital organs and menstrual cycle: Secondary | ICD-10-CM | POA: Insufficient documentation

## 2011-12-10 DIAGNOSIS — N838 Other noninflammatory disorders of ovary, fallopian tube and broad ligament: Secondary | ICD-10-CM | POA: Insufficient documentation

## 2011-12-10 DIAGNOSIS — C541 Malignant neoplasm of endometrium: Secondary | ICD-10-CM

## 2011-12-10 DIAGNOSIS — Z01812 Encounter for preprocedural laboratory examination: Secondary | ICD-10-CM | POA: Insufficient documentation

## 2011-12-10 DIAGNOSIS — Z860100 Personal history of colon polyps, unspecified: Secondary | ICD-10-CM | POA: Insufficient documentation

## 2011-12-10 DIAGNOSIS — D3910 Neoplasm of uncertain behavior of unspecified ovary: Secondary | ICD-10-CM | POA: Insufficient documentation

## 2011-12-10 DIAGNOSIS — IMO0002 Reserved for concepts with insufficient information to code with codable children: Secondary | ICD-10-CM

## 2011-12-10 DIAGNOSIS — Z79899 Other long term (current) drug therapy: Secondary | ICD-10-CM | POA: Insufficient documentation

## 2011-12-10 HISTORY — PX: LYMPH NODE DISSECTION: SHX5087

## 2011-12-10 HISTORY — DX: Other specified postprocedural states: R11.2

## 2011-12-10 HISTORY — DX: Other specified postprocedural states: Z98.890

## 2011-12-10 LAB — ABO/RH: ABO/RH(D): A POS

## 2011-12-10 LAB — TYPE AND SCREEN: Antibody Screen: NEGATIVE

## 2011-12-10 SURGERY — ROBOTIC ASSISTED TOTAL HYSTERECTOMY WITH BILATERAL SALPINGO OOPHORECTOMY
Anesthesia: General | Site: Abdomen | Wound class: Clean Contaminated

## 2011-12-10 MED ORDER — DROPERIDOL 2.5 MG/ML IJ SOLN
0.6250 mg | INTRAMUSCULAR | Status: DC | PRN
  Administered 2011-12-10: 0.625 mg via INTRAVENOUS
  Filled 2011-12-10: qty 0.25

## 2011-12-10 MED ORDER — LACTATED RINGERS IV SOLN: INTRAVENOUS | Status: DC

## 2011-12-10 MED ORDER — FENTANYL CITRATE 0.05 MG/ML IJ SOLN
INTRAMUSCULAR | Status: DC | PRN
  Administered 2011-12-10: 100 ug via INTRAVENOUS
  Administered 2011-12-10: 50 ug via INTRAVENOUS
  Administered 2011-12-10: 100 ug via INTRAVENOUS

## 2011-12-10 MED ORDER — KETOROLAC TROMETHAMINE 30 MG/ML IJ SOLN
30.0000 mg | Freq: Four times a day (QID) | INTRAMUSCULAR | Status: DC
  Administered 2011-12-10 – 2011-12-11 (×4): 30 mg via INTRAVENOUS
  Filled 2011-12-10 (×6): qty 1

## 2011-12-10 MED ORDER — KETOROLAC TROMETHAMINE 30 MG/ML IJ SOLN
30.0000 mg | Freq: Four times a day (QID) | INTRAMUSCULAR | Status: DC
  Filled 2011-12-10 (×5): qty 1

## 2011-12-10 MED ORDER — PROMETHAZINE HCL 25 MG/ML IJ SOLN
INTRAMUSCULAR | Status: AC
  Filled 2011-12-10: qty 1

## 2011-12-10 MED ORDER — MIDAZOLAM HCL 2 MG/2ML IJ SOLN
INTRAMUSCULAR | Status: AC
Start: 2011-12-10 — End: 2011-12-10
  Filled 2011-12-10: qty 2

## 2011-12-10 MED ORDER — ROCURONIUM BROMIDE 100 MG/10ML IV SOLN
INTRAVENOUS | Status: DC | PRN
  Administered 2011-12-10: 50 mg via INTRAVENOUS
  Administered 2011-12-10 (×4): 10 mg via INTRAVENOUS

## 2011-12-10 MED ORDER — KCL IN DEXTROSE-NACL 20-5-0.45 MEQ/L-%-% IV SOLN
INTRAVENOUS | Status: DC
  Administered 2011-12-10 – 2011-12-11 (×2): via INTRAVENOUS
  Filled 2011-12-10 (×4): qty 1000

## 2011-12-10 MED ORDER — ACETAMINOPHEN 10 MG/ML IV SOLN
INTRAVENOUS | Status: DC | PRN
  Administered 2011-12-10: 1000 mg via INTRAVENOUS

## 2011-12-10 MED ORDER — FENTANYL CITRATE 0.05 MG/ML IJ SOLN: 25.0000 ug | INTRAMUSCULAR | Status: DC | PRN

## 2011-12-10 MED ORDER — CEFOTETAN DISODIUM 1 G IJ SOLR
1.0000 g | INTRAMUSCULAR | Status: AC
  Administered 2011-12-10: 1 g via INTRAVENOUS
  Filled 2011-12-10: qty 1

## 2011-12-10 MED ORDER — DEXAMETHASONE SODIUM PHOSPHATE 10 MG/ML IJ SOLN
INTRAMUSCULAR | Status: DC | PRN
  Administered 2011-12-10: 10 mg via INTRAVENOUS

## 2011-12-10 MED ORDER — OXYCODONE-ACETAMINOPHEN 5-325 MG PO TABS: 1.0000 | ORAL_TABLET | ORAL | Status: DC | PRN

## 2011-12-10 MED ORDER — ONDANSETRON HCL 4 MG PO TABS: 4.0000 mg | ORAL_TABLET | Freq: Four times a day (QID) | ORAL | Status: DC | PRN

## 2011-12-10 MED ORDER — HYDROMORPHONE HCL PF 1 MG/ML IJ SOLN
INTRAMUSCULAR | Status: DC | PRN
  Administered 2011-12-10: 1 mg via INTRAVENOUS
  Administered 2011-12-10 (×2): 0.5 mg via INTRAVENOUS

## 2011-12-10 MED ORDER — MORPHINE SULFATE 2 MG/ML IJ SOLN: 2.0000 mg | INTRAMUSCULAR | Status: DC | PRN

## 2011-12-10 MED ORDER — ACETAMINOPHEN 10 MG/ML IV SOLN
INTRAVENOUS | Status: AC
  Filled 2011-12-10: qty 100

## 2011-12-10 MED ORDER — LIDOCAINE HCL (CARDIAC) 20 MG/ML IV SOLN
INTRAVENOUS | Status: DC | PRN
  Administered 2011-12-10: 100 mg via INTRAVENOUS

## 2011-12-10 MED ORDER — PROMETHAZINE HCL 25 MG/ML IJ SOLN
6.2500 mg | INTRAMUSCULAR | Status: AC | PRN
  Administered 2011-12-10 (×2): 6.25 mg via INTRAVENOUS

## 2011-12-10 MED ORDER — PROPOFOL 10 MG/ML IV BOLUS
INTRAVENOUS | Status: DC | PRN
  Administered 2011-12-10: 180 mg via INTRAVENOUS

## 2011-12-10 MED ORDER — LACTATED RINGERS IR SOLN
Status: DC | PRN
  Administered 2011-12-10: 1000 mL

## 2011-12-10 MED ORDER — DIAZEPAM 2 MG PO TABS
2.0000 mg | ORAL_TABLET | Freq: Three times a day (TID) | ORAL | Status: DC | PRN
  Administered 2011-12-10: 2 mg via ORAL
  Filled 2011-12-10: qty 1

## 2011-12-10 MED ORDER — ZOLPIDEM TARTRATE 5 MG PO TABS: 5.0000 mg | ORAL_TABLET | Freq: Every evening | ORAL | Status: DC | PRN

## 2011-12-10 MED ORDER — MIDAZOLAM HCL 2 MG/2ML IJ SOLN
0.5000 mg | INTRAMUSCULAR | Status: DC | PRN
  Administered 2011-12-10: 2 mg via INTRAVENOUS

## 2011-12-10 MED ORDER — MIDAZOLAM HCL 5 MG/5ML IJ SOLN
INTRAMUSCULAR | Status: DC | PRN
  Administered 2011-12-10: 2 mg via INTRAVENOUS

## 2011-12-10 MED ORDER — LACTATED RINGERS IV SOLN
INTRAVENOUS | Status: DC
  Administered 2011-12-10 (×2): via INTRAVENOUS
  Administered 2011-12-10: 1000 mL via INTRAVENOUS

## 2011-12-10 MED ORDER — STERILE WATER FOR IRRIGATION IR SOLN
Status: DC | PRN
  Administered 2011-12-10: 3000 mL

## 2011-12-10 MED ORDER — NEOSTIGMINE METHYLSULFATE 1 MG/ML IJ SOLN
INTRAMUSCULAR | Status: DC | PRN
  Administered 2011-12-10: 4 mg via INTRAVENOUS

## 2011-12-10 MED ORDER — KETOROLAC TROMETHAMINE 30 MG/ML IJ SOLN
INTRAMUSCULAR | Status: AC
  Filled 2011-12-10: qty 1

## 2011-12-10 MED ORDER — SCOPOLAMINE 1 MG/3DAYS TD PT72
MEDICATED_PATCH | TRANSDERMAL | Status: AC
  Filled 2011-12-10: qty 1

## 2011-12-10 MED ORDER — ONDANSETRON HCL 4 MG/2ML IJ SOLN
INTRAMUSCULAR | Status: DC | PRN
  Administered 2011-12-10: 4 mg via INTRAVENOUS

## 2011-12-10 MED ORDER — GLYCOPYRROLATE 0.2 MG/ML IJ SOLN
INTRAMUSCULAR | Status: DC | PRN
  Administered 2011-12-10: 0.6 mg via INTRAVENOUS

## 2011-12-10 MED ORDER — SCOPOLAMINE 1 MG/3DAYS TD PT72
1.0000 | MEDICATED_PATCH | Freq: Once | TRANSDERMAL | Status: DC
  Administered 2011-12-10: 1.5 mg via TRANSDERMAL
  Filled 2011-12-10: qty 1

## 2011-12-10 MED ORDER — ONDANSETRON HCL 4 MG/2ML IJ SOLN: 4.0000 mg | Freq: Four times a day (QID) | INTRAMUSCULAR | Status: DC | PRN

## 2011-12-10 SURGICAL SUPPLY — 50 items
BENZOIN TINCTURE PRP APPL 2/3 (GAUZE/BANDAGES/DRESSINGS) ×2 IMPLANT
CHLORAPREP W/TINT 26ML (MISCELLANEOUS) ×2 IMPLANT
CLOTH BEACON ORANGE TIMEOUT ST (SAFETY) ×2 IMPLANT
CORD HIGH FREQUENCY UNIPOLAR (ELECTROSURGICAL) ×2 IMPLANT
CORDS BIPOLAR (ELECTRODE) ×2 IMPLANT
COVER MAYO STAND STRL (DRAPES) ×2 IMPLANT
COVER SURGICAL LIGHT HANDLE (MISCELLANEOUS) ×2 IMPLANT
COVER TIP SHEARS 8 DVNC (MISCELLANEOUS) ×1 IMPLANT
COVER TIP SHEARS 8MM DA VINCI (MISCELLANEOUS) ×1
DECANTER SPIKE VIAL GLASS SM (MISCELLANEOUS) IMPLANT
DRAPE LG THREE QUARTER DISP (DRAPES) ×4 IMPLANT
DRAPE SURG IRRIG POUCH 19X23 (DRAPES) IMPLANT
DRAPE TABLE BACK 44X90 PK DISP (DRAPES) ×4 IMPLANT
DRAPE UTILITY XL STRL (DRAPES) ×2 IMPLANT
DRAPE WARM FLUID 44X44 (DRAPE) ×2 IMPLANT
DRSG TEGADERM 6X8 (GAUZE/BANDAGES/DRESSINGS) ×4 IMPLANT
ELECT REM PT RETURN 9FT ADLT (ELECTROSURGICAL) ×2
ELECTRODE REM PT RTRN 9FT ADLT (ELECTROSURGICAL) ×1 IMPLANT
GAUZE VASELINE 3X9 (GAUZE/BANDAGES/DRESSINGS) IMPLANT
GLOVE BIO SURGEON STRL SZ 6.5 (GLOVE) ×8 IMPLANT
GLOVE BIO SURGEON STRL SZ7.5 (GLOVE) ×4 IMPLANT
GLOVE BIOGEL PI IND STRL 7.0 (GLOVE) ×2 IMPLANT
GLOVE BIOGEL PI INDICATOR 7.0 (GLOVE) ×2
GOWN PREVENTION PLUS XLARGE (GOWN DISPOSABLE) ×10 IMPLANT
HOLDER FOLEY CATH W/STRAP (MISCELLANEOUS) ×2 IMPLANT
KIT ACCESSORY DA VINCI DISP (KITS) ×1
KIT ACCESSORY DVNC DISP (KITS) ×1 IMPLANT
MANIPULATOR UTERINE 4.5 ZUMI (MISCELLANEOUS) ×2 IMPLANT
OCCLUDER COLPOPNEUMO (BALLOONS) ×2 IMPLANT
PACK LAPAROSCOPY W LONG (CUSTOM PROCEDURE TRAY) ×2 IMPLANT
POUCH SPECIMEN RETRIEVAL 10MM (ENDOMECHANICALS) ×4 IMPLANT
SET TUBE IRRIG SUCTION NO TIP (IRRIGATION / IRRIGATOR) ×2 IMPLANT
SHEET LAVH (DRAPES) ×2 IMPLANT
SOLUTION ELECTROLUBE (MISCELLANEOUS) ×2 IMPLANT
SPONGE LAP 18X18 X RAY DECT (DISPOSABLE) IMPLANT
STRIP CLOSURE SKIN 1/2X4 (GAUZE/BANDAGES/DRESSINGS) ×2 IMPLANT
SUT VIC AB 0 CT1 27 (SUTURE) ×3
SUT VIC AB 0 CT1 27XBRD ANTBC (SUTURE) ×3 IMPLANT
SUT VIC AB 4-0 PS2 27 (SUTURE) ×4 IMPLANT
SUT VICRYL 0 UR6 27IN ABS (SUTURE) ×2 IMPLANT
SYR 50ML LL SCALE MARK (SYRINGE) ×2 IMPLANT
SYR BULB IRRIGATION 50ML (SYRINGE) IMPLANT
TRAP SPECIMEN MUCOUS 40CC (MISCELLANEOUS) ×2 IMPLANT
TRAY FOLEY CATH 14FRSI W/METER (CATHETERS) ×2 IMPLANT
TROCAR 12M 150ML BLUNT (TROCAR) ×2 IMPLANT
TROCAR BLADELESS OPT 5 75 (ENDOMECHANICALS) ×2 IMPLANT
TROCAR XCEL 12X100 BLDLESS (ENDOMECHANICALS) ×2 IMPLANT
TROCAR XCEL BLADELESS 5X75MML (TROCAR) ×2 IMPLANT
TUBING FILTER THERMOFLATOR (ELECTROSURGICAL) IMPLANT
WATER STERILE IRR 1500ML POUR (IV SOLUTION) ×4 IMPLANT

## 2011-12-10 NOTE — Transfer of Care (Signed)
Immediate Anesthesia Transfer of Care Note  Patient: Kristie Howell  Procedure(s) Performed: Procedure(s) (LRB): ROBOTIC ASSISTED TOTAL HYSTERECTOMY WITH BILATERAL SALPINGO OOPHERECTOMY (N/A) LYMPH NODE DISSECTION (N/A)  Patient Location: PACU  Anesthesia Type: General  Level of Consciousness: awake, alert  and oriented  Airway & Oxygen Therapy: Patient Spontanous Breathing and Patient connected to face mask oxygen  Post-op Assessment: Report given to PACU RN and Post -op Vital signs reviewed and stable  Post vital signs: Reviewed and stable  Complications: No apparent anesthesia complications

## 2011-12-10 NOTE — Anesthesia Postprocedure Evaluation (Signed)
Anesthesia Post Note  Patient: Kristie Howell  Procedure(s) Performed: Procedure(s) (LRB): ROBOTIC ASSISTED TOTAL HYSTERECTOMY WITH BILATERAL SALPINGO OOPHERECTOMY (N/A) LYMPH NODE DISSECTION (N/A)  Anesthesia type: General  Patient location: PACU  Post pain: Pain level controlled  Post assessment: Post-op Vital signs reviewed  Last Vitals:  Filed Vitals:   12/10/11 1415  BP:   Pulse:   Temp: 36.2 C  Resp: 12    Post vital signs: Reviewed  Level of consciousness: sedated  Complications: No apparent anesthesia complications

## 2011-12-10 NOTE — H&P (View-Only) (Signed)
Consult Note: Gyn-Onc  Kristie Howell 60 y.o. female  CC:  Chief Complaint  Patient presents with  . Endometrial Adeno    New pt    HPI: Patient is seen today in consultation at the request of Dr. Gottsegen. Patient is a very pleasant 60-year-old gravida 2 para 2 (12 are ages 25 and 20) was menopause about 2-1/2 years ago. She was on a combination hormone replacement therapy consisting of Vivelle-Dot we daily Prometrium. She states that this past summer she started having some vaginal bleeding and reported this to her previous gynecologist who performed and after sound that was unremarkable. She was reassured and or supplements to 4 months. She had recurrent bleeding in January of this year reported this to your gynecologist has similar performed a repeat ultrasound that was reassuring and was recommended that she change her hormone replacement therapy. She was uncomfortable with this and she went to see Dr. Gottsegen.  He performed an ultrasound in saline effusion hysterogram they reveal a retroverted sac septated uterus with a right endometrial cavity measuring 4.5 mm in the left endometrial cavity measuring 5 mm. A biopsy was performed it revealed complex atypical hyperplasia and focal endometrioid adenocarcinoma grade 1 she comes in today for evaluation of the above.  Interval History:   Review of Systems: She denies recurrent bleeding, any change in her bowel or bladder habits. She denies any abdominal or pelvic pain. She does have some right hip and sciatic pain. She was taking Naprosyn for about a week and her pain did decrease but it is intermittent. Dr. even at this her primary care physician. Denies any chest pain shortness of breath change in bowel bladder habits unintentional weight loss or weight gain. 10 point review of systems is essentially negative.  Current Meds:  Outpatient Encounter Prescriptions as of 11/13/2011  Medication Sig Dispense Refill  . Calcium Carbonate-Vitamin D  (CALCIUM + D PO) Take by mouth daily.       . fish oil-omega-3 fatty acids 1000 MG capsule Take 1 g by mouth 2 (two) times daily.       . zolpidem (AMBIEN) 10 MG tablet 1/2 - 1 tablet orally at bedtime as needed for insomnia  20 tablet  1  . estradiol (VIVELLE-DOT) 0.0375 MG/24HR Place 1 patch onto the skin 2 (two) times a week.      . naproxen (NAPROSYN) 500 MG tablet Take 1 tablet (500 mg total) by mouth 2 (two) times daily with a meal.  30 tablet  0  . progesterone (PROMETRIUM) 100 MG capsule Take 100 mg by mouth daily.        Allergy:  Allergies  Allergen Reactions  . Sulfa Antibiotics Swelling and Rash    Social Hx:   History   Social History  . Marital Status: Married    Spouse Name: N/A    Number of Children: 2  . Years of Education: N/A   Occupational History  . homemaker    Social History Main Topics  . Smoking status: Never Smoker   . Smokeless tobacco: Never Used  . Alcohol Use: 1.0 oz/week    2 drink(s) per week     one drink per week.  . Drug Use: No  . Sexually Active: Yes -- Female partner(s)    Birth Control/ Protection: Post-menopausal   Other Topics Concern  . Not on file   Social History Narrative   Lives with husband.  Son in [place], daughter in [place], [place]      Past Surgical Hx:  Past Surgical History  Procedure Date  . Cesarean section 1985 1987    x2    Past Medical Hx:  Past Medical History  Diagnosis Date  . PMB (postmenopausal bleeding)   . Hemorrhoids     internal/external seen on colonoscopy 02/2011  . Diverticulosis 02/2011  . Hyperplastic colon polyp 02/2011  . Insomnia 2012  . Abnormal TSH     borderline; followed by Dr. Balan    Family Hx:  Family History  Problem Relation Age of Onset  . Thyroid disease Mother   . Melanoma Mother   . Cancer Father 60    Colon cancer  . Heart attack Maternal Grandmother   . Ovarian cancer Paternal Grandmother     Vitals:  Blood pressure 120/62, pulse 80, temperature 98.2 F (36.8 C),  resp. rate 18, height 5' 5.04" (1.652 m), weight 128 lb 14.4 oz (58.469 kg).  Physical Exam: Well-nourished well-developed female in no acute distress.  Neck: Supple no lymphadenopathy no diamond repair  Lungs: Clear to auscultation bilaterally.  Cardiovascular: Regular rate and rhythm  Abdomen: Soft, nontender, nondistended. She is a well-healed transverse skin incision. There is no hepatosplenomegaly. There is no rebound or guarding.  Groins: No lymphadenopathy.  Extremities: No edema.  Pelvic: Normal external female genitalia. Vagina is atrophic. The cervix is visualized is nulliparous. There is no gross visible lesions. Bimanual examination reveals a retroflexed uterus. It is of normal size shape and consistency. There are no adnexal masses. Rectal confirms no nodularity.  Assessment/Plan: 60-year-old gravida 2 para 2 with complex atypical hyperplasia and focal grade 1 endometrial carcinoma. We discussed treatment options with her husband. After discussion which included surgical management we will proceed with a total robotic hysterectomy bilateral salpingo-oophorectomy. The uterus be sent for frozen section. Pending the results of the frozen section and depth of myometrial invasion will proceed with lymphadenectomy. She had questions regarding lymphedema which we discussed and I quoted her a 5% lymphedema risk.   She understands the risks her including but not limited to bleeding, infection, injury to surrounding organs, thromboembolic disease, need to proceed with laparotomy, vaginal cuff separation, and additional surgery. Their questions were elicited in answer to her satisfaction. They're tentatively looking at a surgical date of May 28. They have my card as well as that of our staff she knows she can call us if she has any questions. She is also concerned about anesthesia. She would like to opportunity to speak with the anesthesiologist before surgery secondary to her fears.   She  and her husband both understand that any adjuvant treatment will be based on final pathology and that that is not information that we currently have available.  However, based on her preoperative biopsy primarily joint complex atypical hyperplasia with only a focus of grade 1 endometrial carcinoma I believe that there is a high likelihood that this will be all the treatment that she requires. We briefly discussed the followup plan and her primary gynecologist will be involved in her follow up.  Franke Menter A., MD 11/13/2011, 3:43 PM   

## 2011-12-10 NOTE — Op Note (Signed)
PATIENT: Kristie Howell DATE OF BIRTH: Sep 13, 1950 ENCOUNTER DATE:    Preop Diagnosis: Complex hyperplasia with atypia with a focus of grade 1 endometrioid adenocarcinoma.   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 Duard Brady, MD; Antionette Char, MD   Assistant: Telford Nab   Anesthesia: General   Estimated blood loss: 50 ml   IVF: 2500 ml   Urine output: 350 ml   Complications: None   Pathology: Uterus, cervix, bilateral tubes and ovaries, left pelvic and para-aortic lymph nodes to pathology, omental and peritoneal biopsies  Operative findings: Globular uterus which was retroflexed secondary to adhesions in the cul-de-sac. A 4 cm complex ovarian mass in the left side with significant amount of surface excrescence. Frozen section consistent with at least a low malignant potential tumor. No tumor within the uterus for frozen section. Normal appearing appendix.  No peritoneal disease. No significant lymphadenopathy.  Procedure: The patient was identified in the preoperative holding area. Informed consent was signed on the chart. Patient was seen history was reviewed and exam was performed.   The patient was then taken to the operating room and placed in the supine position with SCD hose on. General anesthesia was then induced without difficulty. She was then placed in the dorsolithotomy position. Her arms were tucked at her side with appropriate precautions on the gel pad. Shoulder blocks were then placed in the usual fashion with appropriate precautions. A OG-tube was placed to suction. First timeout was performed to confirm the patient procedure antibiotic allergy status, estimated estimated blood loss and OR time. The  perineum was then prepped in the usual fashion with Betadine. A 14 French Foley was inserted into the bladder under sterile conditions. A sterile speculum was placed in the vagina. The cervix was without lesions. The cervix was grasped with a single-tooth tenaculum. The dilator without difficulty. A ZUMI with a medium Koe ring was placed without difficulty however due to the uterine septum could not advance the ZUMI into the endometrial cavity. The abdomen was then prepped with 1 Chlor prep sponge per protocol.    Patient was then draped after the prep was dried. Second timeout was performed to confirm the above. After again confirming OG tube placement and it was to suction. A stab-wound was made in left upper quadrant 2 cm below the costal margin on the left in the midclavicular line. A 5 mm operative report was used to assure intra-abdominal placement. The abdomen was insufflated. At this point all points during the procedure the patient's intra-abdominal pressure was not increased over 15 mm of mercury. After insufflation was complete, the patient was placed in deep Trendelenburg position. 25 cm above the pubic symphysis that area was marked the camera port. Bilateral robotic ports were marked 10 cm from the midline incision at approximately 5 angle. Under direct visualization each of the trochars was placed into the abdomen. The small bowel was folded on its mesentery to allow visualization to the pelvis. The 5 mm LUQ port was then converted to a 10/12 port under direct visualization.  After assuring adequate visualization, the robot was then docked in the usual fashion. Under direct visualization the robotic instruments replaced.   The round ligament on the patient's right side was transected with monopolar cautery. The anterior and posterior leaves of  the broad ligament were then taken down in the usual fashion. The ureter was identified on the medial leaf of the broad ligament. A window was made  between [facility] and the ureter. The IP was coagulated with bipolar cautery and transected. The posterior leaf of the broad ligament was taken down to the level of the KOH ring. The adhesive disease of the uterine fundus to the cul-de-sac was taken down sharply. It was at this point at the left ovarian mass was identified. A biopsy was taken and sent for frozen section. The bladder flap was created using meticulous dissection and pinpoint cautery.  There was significant adhesions of the uterus and the bladder secondary to 2 cesarean sections. The uterine vessels were coagulated with bipolar cautery. The uterine vessels were then transected and the C loop was created. The same procedure was performed on the patient's left side.   The pneumo-occulder in the vagina was then insufflated. The colpotomy was then created in the usual fashion. The specimen was then delivered to the vagina and sent for frozen section. The ovarian frozen section returned with pathology consistent with at least a low malignant potential tumor. The uterine specimen frozen section returned as no visible tumor appear Our attention was then drawn to opening the paravesical space on her left side the perirectal space was also opened. The obturator nerve was identified. The nodal bundle extending over the external iliac artery down to the external iliac vein was taken down using sharp dissection and monopolar cautery. The genitofemoral nerve was identified and spared. We continued our dissection down to the level of the obturator nerve. The nodal bundle superior to the obturator nerve was taken. All pedicles were noted to be hemostatic the ureter was noted to be well medial of the area of dissection. The nodal bundle was then placed and an Endo catch bag and delivered through the 10-12 assistant port.  Our attention was then drawn to the anterior surface of the aorta. The IMA was identified. The peritoneum from the common left common iliac to the IMA  was opened. Retroperitoneally the ureter was identified. The Assistant grasper was placed to deviate the ureter laterally. The nodal bundle consisting of a left para-aortic lymph nodes were removed and placed in an endocatch bag. These were delivered to the assistant port  A portion of the omentum overlying the right-sided colon was taken down. An infracolic omentectomy was performed by using meticulous bipolar cautery. The omentum was placed and an Endo catch bag and delivered to the vagina. The pedicles are hemostatic.  The vaginal cuff was closed with a running 0 Vicryl on CT 1 suture. The abdomen and pelvis were copiously irrigated and noted to be hemostatic. The robotic instruments were removed under direct visualization as were the robotic trochars. The pneumoperitoneum was removed. The patient was then taken out of the Trendelenburg position. Using of 0 Vicryl on a UR 6 needle the midline port fascia was closed. It was reapproximated in the left upper quadrant. The skin was closed using 4-0 Vicryl. Steri-Strips and benzoin were applied. The pneumo occluded balloon was removed from the vagina. The vagina was swabbed and noted to be hemostatic.   All instrument needle and Ray-Tec counts were correct x2. The patient tolerated the procedure well and was taken to the recovery room in stable condition. This is Cleda Mccreedy dictating an operative note on patient Angelyse Heslin Gambill.

## 2011-12-10 NOTE — Anesthesia Preprocedure Evaluation (Addendum)
Anesthesia Evaluation  Patient identified by MRN, date of birth, ID band Patient awake    Reviewed: Allergy & Precautions, H&P , NPO status , Patient's Chart, lab work & pertinent test results  History of Anesthesia Complications (+) PONV  Airway Mallampati: II TM Distance: >3 FB Neck ROM: Full    Dental  (+) Teeth Intact and Dental Advisory Given   Pulmonary neg pulmonary ROS,  breath sounds clear to auscultation  Pulmonary exam normal       Cardiovascular negative cardio ROS  Rhythm:Regular Rate:Normal     Neuro/Psych Anxiety negative neurological ROS  negative psych ROS   GI/Hepatic negative GI ROS, Neg liver ROS,   Endo/Other  negative endocrine ROS  Renal/GU negative Renal ROS  negative genitourinary   Musculoskeletal negative musculoskeletal ROS (+)   Abdominal   Peds  Hematology negative hematology ROS (+)   Anesthesia Other Findings   Reproductive/Obstetrics negative OB ROS                          Anesthesia Physical Anesthesia Plan  ASA: I  Anesthesia Plan: General   Post-op Pain Management:    Induction: Intravenous  Airway Management Planned: Oral ETT  Additional Equipment:   Intra-op Plan:   Post-operative Plan: Extubation in OR  Informed Consent: I have reviewed the patients History and Physical, chart, labs and discussed the procedure including the risks, benefits and alternatives for the proposed anesthesia with the patient or authorized representative who has indicated his/her understanding and acceptance.   Dental advisory given  Plan Discussed with: CRNA  Anesthesia Plan Comments:         Anesthesia Quick Evaluation

## 2011-12-10 NOTE — Interval H&P Note (Signed)
History and Physical Interval Note:  12/10/2011 9:55 AM  Kristie Howell  has presented today for surgery, with the diagnosis of endometrial cancer  The various methods of treatment have been discussed with the patient and family. After consideration of risks, benefits and other options for treatment, the patient has consented to  Procedure(s) (LRB): ROBOTIC ASSISTED TOTAL HYSTERECTOMY WITH BILATERAL SALPINGO OOPHERECTOMY (N/A) LYMPH NODE DISSECTION (N/A) as a surgical intervention .  The patients' history has been reviewed, patient examined, no change in status, stable for surgery.  I have reviewed the patients' chart and labs.  Questions were answered to the patient's satisfaction.     Kristie Cloninger A.

## 2011-12-11 DIAGNOSIS — IMO0002 Reserved for concepts with insufficient information to code with codable children: Secondary | ICD-10-CM

## 2011-12-11 DIAGNOSIS — R1900 Intra-abdominal and pelvic swelling, mass and lump, unspecified site: Secondary | ICD-10-CM | POA: Insufficient documentation

## 2011-12-11 LAB — CA 125: CA 125: 191.9 U/mL — ABNORMAL HIGH (ref 0.0–30.2)

## 2011-12-11 LAB — CBC
HCT: 36.1 % (ref 36.0–46.0)
MCHC: 34.6 g/dL (ref 30.0–36.0)
RDW: 12.7 % (ref 11.5–15.5)

## 2011-12-11 LAB — BASIC METABOLIC PANEL
BUN: 4 mg/dL — ABNORMAL LOW (ref 6–23)
Calcium: 8.4 mg/dL (ref 8.4–10.5)
GFR calc Af Amer: >90 mL/min (ref >90)
GFR calc non Af Amer: >90 mL/min (ref >90)
Potassium: 4.2 mEq/L (ref 3.5–5.1)
Sodium: 135 mEq/L (ref 135–145)

## 2011-12-11 MED ORDER — OXYCODONE-ACETAMINOPHEN 5-325 MG PO TABS: 1.0000 | ORAL_TABLET | ORAL | Status: AC | PRN

## 2011-12-11 NOTE — Discharge Instructions (Signed)
12/11/2011  Return to work: 4-6 weeks  Activity: 1. Be up and out of the bed during the day.  Take a nap if needed.  You may walk up steps but be careful and use the hand rail.  Stair climbing will tire you more than you think, you may need to stop part way and rest.   2. No lifting or straining for 6 weeks.  3. Do Not drive if you are taking narcotic pain medicine.  4. Shower daily.  Use soap and water on your incision and pat dry; don't rub.   5. No sexual activity and nothing in the vagina for 8 weeks.  Diet: 1. Low sodium Heart Healthy Diet is recommended.  2. It is safe to use a laxative if you have difficulty moving your bowels.   Wound Care: 1. Keep clean and dry.  Shower daily.  Reasons to call the Doctor:   Fever - Oral temperature greater than 100.4 degrees Fahrenheit  Foul-smelling vaginal discharge  Difficulty urinating  Nausea and vomiting  Increased pain at the site of the incision that is unrelieved with pain medicine.  Difficulty breathing with or without chest pain  New calf pain especially if only on one side  Sudden, continuing increased vaginal bleeding with or without clots.   Contacts: For questions or concerns you should contact:  Dr. Antionette Char at [phone]  Dr. Duard Brady at [facility] [phone]  Robotic Assisted Total Laparoscopic Hysterectomy A total laparoscopic hysterectomy is a minimally invasive surgery to remove your uterus and cervix. This surgery is performed by making several small cuts (incisions) in your abdomen. It can also be done with a thin, lighted tube (laparoscope) inserted into 2 small incisions in the lower abdomen. Your fallopian tubes and ovaries can be removed (bilateral salpingo-oopherectomy) during this surgery as well.If a total laparoscopic hysterectomy is started and it is not safe to continue, the laparoscopic surgery will be converted to an open abdominal surgery. You will not have menstrual periods or be  able to get pregnant after having this surgery. If a bilateral salpingo-oopherectomy was performed before menopause, you will go through a sudden (abrupt) menopause. This can be helped with hormone medicines. Benefits of minimally invasive surgery include:  Less pain.   Less risk of blood loss.   Less risk of infection.   Quicker return to normal activities.   Usually a 1 night stay in the hospital.   Overall patient satisfaction.  LET YOUR CAREGIVER KNOW ABOUT:  Any history of abnormal Pap tests.   Allergies to food or medicine.   Medicines taken, including vitamins, herbs, eyedrops, over-the-counter medicines, and creams.   Use of steroids (by mouth or creams).   Previous problems with anesthetics or numbing medicines.   History of bleeding problems or blood clots.   Previous surgery.   Other health problems, including diabetes and kidney problems.   Desire for future fertility.   Any infections or colds you may have developed.   Symptoms of irregular or heavy periods, weight loss, or urinary or bowel changes.  RISKS AND COMPLICATIONS   Bleeding.   Blood clots in the legs or lung.   Infection.   Injury to surrounding organs.   Problems with anesthesia.   Early menopause symptoms (hot flashes, night sweats, insomnia).   Risk of conversion to an open abdominal incision.  BEFORE THE PROCEDURE  Ask your caregiver about changing or stopping your regular medicines.   Do not take aspirin or blood thinners (anticoagulants) for  1 week before the surgery, or as told by your caregiver.   Do not eat or drink anything for 8 hours before the surgery, or as told by your caregiver.   Quit smoking if you smoke.   Arrange for a ride home after surgery and for someone to help you at home during recovery.  PROCEDURE   You will be given antibiotic medicine.   An intravenous (IV) line will be placed in your arm. You will be given medicine to make you sleep (general  anesthetic).   A gas (carbon dioxide) will be used to inflate your abdomen. This will allow your surgeon to look inside your abdomen, perform your surgery, and treat any other problems found if necessary.   Three or four small incisions (often less than  inch) will be made in your abdomen. One of these incisions will be made in the area of your belly button (navel). The laparoscope will be inserted into the incision. Your surgeon will look through the laparoscope while doing your procedure.   Other surgical instruments will be inserted through the other incisions.   The uterus may be removed through the vagina or cut into small pieces and removed through the small incisions.   Your incisions will be closed.  AFTER THE PROCEDURE  The gas will be released from inside your abdomen.   You will be taken to the recovery area where a nurse will watch and check your progress. Once you are awake, stable, and taking fluids well, without other problems, you will return to your room or be allowed to go home.   There is usually minimal discomfort following the surgery because the incisions are so small.   You will be given pain medicine while you are in the hospital and for when you go home.   Try to have someone with you the first 3 to 5 days after you go home.   Follow up with your surgeon in 2 to 4 weeks after surgery to evaluate your progress.  Document Released: 04/28/2007 Document Revised: 06/20/2011 Document Reviewed: 02/15/2011 [facility] Patient Information 2012 [place], [place].

## 2011-12-11 NOTE — Discharge Summary (Signed)
Physician Discharge Summary  Patient ID: Kristie Howell MRN: [id number] DOB/AGE: 59-Apr-1952 59 y.o.  Admit date: 12/10/2011 Discharge date: 12/11/2011  Admission Diagnoses: Endometrioid adenocarcinoma  Discharge Diagnoses:  Principal Problem:  *Endometrioid adenocarcinoma Left Adnexal Mass, LMP  Discharged Condition: good  Hospital Course: On 12/10/2011, the patient underwent the following: Procedure(s): ROBOTIC ASSISTED TOTAL HYSTERECTOMY WITH BILATERAL SALPINGO OOPHERECTOMY LYMPH NODE DISSECTION.   The postoperative course was uneventful.  She was discharged to home on postoperative day 1 tolerating a regular diet.  Consults: None  Significant Diagnostic Studies: None  Treatments: surgery: See above  Discharge Exam: Blood pressure 105/67, pulse 95, temperature 98.4 F (36.9 C), temperature source Oral, resp. rate 18, height 5\' 5"  (1.651 m), weight 127 lb 7 oz (57.805 kg), SpO2 98.00%. General appearance: alert and cooperative GI: soft, non-tender; bowel sounds normal; no masses,  no organomegaly Extremities: extremities normal, atraumatic, no cyanosis or edema Incision/Wound: C/D/I  Disposition: Final discharge disposition not confirmed  Discharge Orders    Future Orders Please Complete By Expires   Diet - low sodium heart healthy      Increase activity slowly      Driving Restrictions      Comments:   Do not take narcotics and drive.   Lifting restrictions      Comments:   No lifting greater than 30 lbs.   Sexual Activity Restrictions      Comments:   No sexual activity for 8 weeks.   Call MD for:  temperature >100.4      Call MD for:  persistant nausea and vomiting      Call MD for:  severe uncontrolled pain      Call MD for:  redness, tenderness, or signs of infection (pain, swelling, redness, odor or green/yellow discharge around incision site)      Call MD for:  difficulty breathing, headache or visual disturbances      Call MD for:  hives      Call MD for:   persistant dizziness or light-headedness      Call MD for:  extreme fatigue        Medication List  As of 12/11/2011  8:27 AM   TAKE these medications         CALCIUM + D PO   Take by mouth daily.      fish oil-omega-3 fatty acids 1000 MG capsule   Take 1 g by mouth 2 (two) times daily.      oxyCODONE-acetaminophen 5-325 MG per tablet   Commonly known as: PERCOCET   Take 1-2 tablets by mouth every 4 (four) hours as needed (moderate to severe pain (when tolerating fluids)).      zolpidem 10 MG tablet   Commonly known as: AMBIEN   1/2 - 1 tablet orally at bedtime as needed for insomnia           Follow-up Information    Please follow up. (Call Telford Nab [phone])          Signed: Niralya Ohanian DEAL 12/11/2011, 8:27 AM

## 2011-12-13 ENCOUNTER — Encounter (HOSPITAL_COMMUNITY): Payer: Self-pay | Admitting: Gynecologic Oncology

## 2011-12-13 ENCOUNTER — Telehealth: Payer: Self-pay | Admitting: *Deleted

## 2011-12-13 NOTE — Telephone Encounter (Signed)
Patient notified of Path results.  F/U appt 12/18/11

## 2011-12-18 ENCOUNTER — Encounter: Payer: Self-pay | Admitting: Gynecologic Oncology

## 2011-12-18 ENCOUNTER — Ambulatory Visit: Payer: PRIVATE HEALTH INSURANCE | Attending: Gynecologic Oncology | Admitting: Gynecologic Oncology

## 2011-12-18 VITALS — BP 118/64 | HR 68 | Temp 98.5°F | Resp 18 | Ht 65.04 in | Wt 126.8 lb

## 2011-12-18 DIAGNOSIS — Z860100 Personal history of colon polyps, unspecified: Secondary | ICD-10-CM | POA: Insufficient documentation

## 2011-12-18 DIAGNOSIS — C541 Malignant neoplasm of endometrium: Secondary | ICD-10-CM

## 2011-12-18 DIAGNOSIS — Z808 Family history of malignant neoplasm of other organs or systems: Secondary | ICD-10-CM | POA: Insufficient documentation

## 2011-12-18 DIAGNOSIS — K5730 Diverticulosis of large intestine without perforation or abscess without bleeding: Secondary | ICD-10-CM | POA: Insufficient documentation

## 2011-12-18 DIAGNOSIS — Z90710 Acquired absence of both cervix and uterus: Secondary | ICD-10-CM | POA: Insufficient documentation

## 2011-12-18 DIAGNOSIS — Z800 Family history of malignant neoplasm of digestive organs: Secondary | ICD-10-CM | POA: Insufficient documentation

## 2011-12-18 DIAGNOSIS — Z8249 Family history of ischemic heart disease and other diseases of the circulatory system: Secondary | ICD-10-CM | POA: Insufficient documentation

## 2011-12-18 DIAGNOSIS — Z8041 Family history of malignant neoplasm of ovary: Secondary | ICD-10-CM | POA: Insufficient documentation

## 2011-12-18 DIAGNOSIS — C549 Malignant neoplasm of corpus uteri, unspecified: Secondary | ICD-10-CM | POA: Insufficient documentation

## 2011-12-18 DIAGNOSIS — Z9079 Acquired absence of other genital organ(s): Secondary | ICD-10-CM | POA: Insufficient documentation

## 2011-12-18 NOTE — Patient Instructions (Signed)
Return to clinic in 4-6 weeks.

## 2011-12-18 NOTE — Progress Notes (Signed)
Consult Note: Gyn-Onc  Kristie Howell 61 y.o. female  CC:  Chief Complaint  Patient presents with  . Endo ca, LMP tumor    Follow up    HPI: Patient is a very pleasant 61 year old gravida 2 para 2 (12 are ages 64 and 4) was menopause about 2-1/2 years ago. She was on a combination hormone replacement therapy consisting of Vivelle-Dot we daily Prometrium. She states that this past summer she started having some vaginal bleeding and reported this to her previous gynecologist who performed and after sound that was unremarkable. She was reassured and or supplements to 4 months. She had recurrent bleeding in January of this year reported this to your gynecologist has similar performed a repeat ultrasound that was reassuring and was recommended that she change her hormone replacement therapy. She was uncomfortable with this and she went to see Dr. Eda Paschal.  He performed an ultrasound in saline effusion hysterogram they reveal a retroverted sac septated uterus with a right endometrial cavity measuring 4.5 mm in the left endometrial cavity measuring 5 mm. A biopsy was performed it revealed complex atypical hyperplasia and focal endometrioid adenocarcinoma grade 1.  Interval History:  She underwent a total robotic hysterectomy bilateral salpingo-oophorectomy left pelvic lymph node dissection) clinical dissection omental biopsy and peritoneal biopsies on 12/10/2011. Operative findings included a globular uterus which was retroflexed secondary to adhesions in the cul-de-sac. It was a 4 cm complex ovarian mass on the left side with significant amount of surface excrescence. Frozen section was consistent with at least a low malignant potential tumor. There was no tumor within the uterus on frozen section.  She had a normal appearing appendix, no peritoneal disease, and no significant adenopathy.  Final pathology within the uterus revealed a grade 1 endometrioid adenocarcinoma superficially invading into the  underlying myometrium 0.1 cm out of 2.0 cm. There is no evidence of any lymphovascular space involvement. There were fibroids. The cervix was negative. Bilateral fallopian tubes were negative. Bilateral tumors were noted on the adnexa. The left ovary had a serous borderline tumor with microinvasion. The right ovary had a serous borderline tumor. Zero out of two left pelvic, 0/5 left para-aortic nodes were negative. There is no tumor in the omentum or any of the peritoneal based biopsies. Her CA 125 was checked immediately postoperatively was elevated at 191.9. Final [place] staging included a stage IA grade 1 endometrioid adenocarcinoma and a stage IC ovarian borderline tumor. There was microinvasion of the left side. The right side had a borderline tumor with no microinvasion.  Review of Systems: She is doing well postoperatively. She did have some bruising after surgery. She is tired but feels that she is getting better. In the postoperatively her right hip and sciatic pain completely resolved. It has come back at this point. She's wondering whether she could start physical therapy or yoga to help with this.  She is having some night sweats in that covers at night but is able to fall back asleep. She's not having any vasomotor symptoms during the day.  Current Meds:  Outpatient Encounter Prescriptions as of 12/18/2011  Medication Sig Dispense Refill  . acetaminophen (TYLENOL) 500 MG tablet Take 500 mg by mouth as needed.      . Calcium Carbonate-Vitamin D (CALCIUM + D PO) Take by mouth daily.       . fish oil-omega-3 fatty acids 1000 MG capsule Take 1 g by mouth 2 (two) times daily.       Marland Kitchen zolpidem (AMBIEN)  10 MG tablet 1/2 - 1 tablet orally at bedtime as needed for insomnia  20 tablet  1  . oxyCODONE-acetaminophen (PERCOCET) 5-325 MG per tablet Take 1-2 tablets by mouth every 4 (four) hours as needed (moderate to severe pain (when tolerating fluids)).  30 tablet  0    Allergy:  Allergies  Allergen  Reactions  . Sulfa Antibiotics Swelling and Rash    Social Hx:   History   Social History  . Marital Status: Married    Spouse Name: N/A    Number of Children: 2  . Years of Education: N/A   Occupational History  . homemaker    Social History Main Topics  . Smoking status: Never Smoker   . Smokeless tobacco: Never Used  . Alcohol Use: 1.0 oz/week    2 drink(s) per week     one drink per week.  . Drug Use: No  . Sexually Active: Yes -- Female partner(s)    Birth Control/ Protection: Post-menopausal   Other Topics Concern  . Not on file   Social History Narrative   Lives with husband.  Son in [place], daughter in [place], [place]    Past Surgical Hx:  Past Surgical History  Procedure Date  . Cesarean section 1985 1987    x2  . Lymph node dissection 12/10/2011    Procedure: LYMPH NODE DISSECTION;  Surgeon: Rejeana Brock A. Duard Brady, MD;  Location: [facility];  Service: Gynecology;  Laterality: N/A;  Possible Lymph Nodes  . Abdominal hysterectomy     Total robotic hysterectomy bilateral salpingo-oophorectomy, left pelvic lymph node dissection and left para-aortic lymph node dissection, omental biopsy, peritoneal biopsy    Past Medical Hx:  Past Medical History  Diagnosis Date  . PMB (postmenopausal bleeding)   . Hemorrhoids     internal/external seen on colonoscopy 02/2011  . Diverticulosis 02/2011  . Hyperplastic colon polyp 02/2011  . Insomnia 2012  . Abnormal TSH     borderline; followed by Dr. Zenda Alpers testing was normal-yearly checks  . Anxiety 12-04-11    prone to panic attacks if stressed, heart rate increases  . PONV (postoperative nausea and vomiting)     Family Hx:  Family History  Problem Relation Age of Onset  . Thyroid disease Mother   . Melanoma Mother   . Cancer Father 35    Colon cancer  . Heart attack Maternal Grandmother   . Ovarian cancer Paternal Grandmother     Vitals:  Blood pressure 118/64, pulse 68, temperature 98.5 F (36.9 C), temperature source  Oral, resp. rate 18, height 5' 5.04" (1.652 m), weight 126 lb 12.8 oz (57.516 kg).  Physical Exam: Well-nourished well-developed female in no acute distress.  Abdomen: Well-healed surgical incisions. Steri-Strips were removed. She has some bruising around the incisions as well as some lower abdominal bruising. Abdomen is appropriately tender. Sutures out from the right lower quadrant port were removed.  Pelvic: External genitalia within normal limits. The vaginal cuff is intact. Bimanual examination reveals some cuff tenderness which is to be expected. 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 At this point there is no need for adjuvant chemotherapy as she had microinvasive disease limited to the adnexa. I would recommend close followup. Her CA 125 is elevated it can serve as a tumor marker. She is at risk for recurrence  based on microinvasive disease Messer staging was otherwise negative I again would recommend close followup. We did discuss hormones at this point would caution her from using any estrogenic compound and she is amenable to this. If the hot flashes get worse she will discuss that with as we can discuss potentially start her on Cymbalta. She will return to see me in 4-6 weeks for her final postoperative check. She requests that was given a copy of her pathology report.  Gregery Walberg A., MD 12/18/2011, 11:00 AM

## 2011-12-24 ENCOUNTER — Ambulatory Visit: Payer: PRIVATE HEALTH INSURANCE | Attending: Gynecologic Oncology | Admitting: Gynecologic Oncology

## 2011-12-24 ENCOUNTER — Encounter: Payer: Self-pay | Admitting: Gynecologic Oncology

## 2011-12-24 VITALS — BP 118/64 | HR 68 | Temp 98.2°F | Resp 16 | Ht 65.04 in | Wt 126.8 lb

## 2011-12-24 DIAGNOSIS — Z90710 Acquired absence of both cervix and uterus: Secondary | ICD-10-CM | POA: Insufficient documentation

## 2011-12-24 DIAGNOSIS — C541 Malignant neoplasm of endometrium: Secondary | ICD-10-CM

## 2011-12-24 DIAGNOSIS — C549 Malignant neoplasm of corpus uteri, unspecified: Secondary | ICD-10-CM | POA: Insufficient documentation

## 2011-12-24 NOTE — Progress Notes (Signed)
Consult Note: Gyn-Onc  Kristie Howell 61 y.o. female  CC:  Chief Complaint  Patient presents with  . Endo ca    Follow up    HPI: Patient is a very pleasant 61 year old gravida 2 para 2 (12 are ages 89 and 80) was menopause about 2-1/2 years ago. She was on a combination hormone replacement therapy consisting of Vivelle-Dot we daily Prometrium. She states that this past summer she started having some vaginal bleeding and reported this to her previous gynecologist who performed and after sound that was unremarkable. She was reassured and or supplements to 4 months. She had recurrent bleeding in January of this year reported this to your gynecologist has similar performed a repeat ultrasound that was reassuring and was recommended that she change her hormone replacement therapy. She was uncomfortable with this and she went to see Dr. Eda Paschal.  He performed an ultrasound in saline effusion hysterogram they reveal a retroverted sac septated uterus with a right endometrial cavity measuring 4.5 mm in the left endometrial cavity measuring 5 mm. A biopsy was performed it revealed complex atypical hyperplasia and focal endometrioid adenocarcinoma grade 1.   Interval History:  She underwent a total robotic hysterectomy bilateral salpingo-oophorectomy left pelvic lymph node dissection) clinical dissection omental biopsy and peritoneal biopsies on 12/10/2011. Operative findings included a globular uterus which was retroflexed secondary to adhesions in the cul-de-sac. It was a 4 cm complex ovarian mass on the left side with significant amount of surface excrescence. Frozen section was consistent with at least a low malignant potential tumor. There was no tumor within the uterus on frozen section. She had a normal appearing appendix, no peritoneal disease, and no significant adenopathy.  Final pathology within the uterus revealed a grade 1 endometrioid adenocarcinoma superficially invading into the underlying  myometrium 0.1 cm out of 2.0 cm. There is no evidence of any lymphovascular space involvement. There were fibroids. The cervix was negative. Bilateral fallopian tubes were negative. Bilateral tumors were noted on the adnexa. The left ovary had a serous borderline tumor with microinvasion. The right ovary had a serous borderline tumor. Zero out of two left pelvic, 0/5 left para-aortic nodes were negative. There is no tumor in the omentum or any of the peritoneal based biopsies. Her CA 125 was checked immediately postoperatively was elevated at 191.9. Final [place] staging included a stage IA grade 1 endometrioid adenocarcinoma and a stage IC ovarian borderline tumor. There was microinvasion of the left side. The right side had a borderline tumor with no microinvasion.   Review of Systems:  I saw her last week for a brief postoperative check as well as discuss her pathology. At that time she had some bruising around her incision site as well as the inferior abdomen. She comes by complaining of a not at the right-sided ports I wanted that to be seen. She noticed it early this week, she bumped her abdomen against the kitchen counter.   Current Meds:  Outpatient Encounter Prescriptions as of 12/24/2011  Medication Sig Dispense Refill  . acetaminophen (TYLENOL) 500 MG tablet Take 500 mg by mouth as needed.      . Calcium Carbonate-Vitamin D (CALCIUM + D PO) Take by mouth daily.       . fish oil-omega-3 fatty acids 1000 MG capsule Take 1 g by mouth 2 (two) times daily.       Marland Kitchen zolpidem (AMBIEN) 10 MG tablet 1/2 - 1 tablet orally at bedtime as needed for insomnia  20 tablet  1    Allergy:  Allergies  Allergen Reactions  . Sulfa Antibiotics Swelling and Rash    Social Hx:   History   Social History  . Marital Status: Married    Spouse Name: N/A    Number of Children: 2  . Years of Education: N/A   Occupational History  . homemaker    Social History Main Topics  . Smoking status: Never Smoker   .  Smokeless tobacco: Never Used  . Alcohol Use: 1.0 oz/week    2 drink(s) per week     one drink per week.  . Drug Use: No  . Sexually Active: Yes -- Female partner(s)    Birth Control/ Protection: Post-menopausal   Other Topics Concern  . Not on file   Social History Narrative   Lives with husband.  Son in [place], daughter in [place], [place]    Past Surgical Hx:  Past Surgical History  Procedure Date  . Cesarean section 1985 1987    x2  . Lymph node dissection 12/10/2011    Procedure: LYMPH NODE DISSECTION;  Surgeon: Rejeana Brock A. Duard Brady, MD;  Location: [facility];  Service: Gynecology;  Laterality: N/A;  Possible Lymph Nodes  . Abdominal hysterectomy     Total robotic hysterectomy bilateral salpingo-oophorectomy, left pelvic lymph node dissection and left para-aortic lymph node dissection, omental biopsy, peritoneal biopsy    Past Medical Hx:  Past Medical History  Diagnosis Date  . PMB (postmenopausal bleeding)   . Hemorrhoids     internal/external seen on colonoscopy 02/2011  . Diverticulosis 02/2011  . Hyperplastic colon polyp 02/2011  . Insomnia 2012  . Abnormal TSH     borderline; followed by Dr. Zenda Alpers testing was normal-yearly checks  . Anxiety 12-04-11    prone to panic attacks if stressed, heart rate increases  . PONV (postoperative nausea and vomiting)     Family Hx:  Family History  Problem Relation Age of Onset  . Thyroid disease Mother   . Melanoma Mother   . Cancer Father 58    Colon cancer  . Heart attack Maternal Grandmother   . Ovarian cancer Paternal Grandmother     Vitals:  Blood pressure 118/64, pulse 68, temperature 98.2 F (36.8 C), temperature source Oral, resp. rate 16, height 5' 5.04" (1.652 m), weight 126 lb 12.8 oz (57.516 kg).  Physical Exam:  Well-nourished well-developed female in no acute distress.  Abdomen: Normal postoperative changes at all port sites. The ecchymosis has decreased significantly. There is no evidence of any incisional  hernias. Abdomen is soft and nontender.  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 At this point there is no need for adjuvant chemotherapy as she had microinvasive disease limited to the adnexa. She was reassured and encouraged to followup with [place] if there's any other issues in the postoperative period. She will keep her previously scheduled appointment.  Welborn Keena A., MD 12/24/2011, 2:27 PM

## 2011-12-24 NOTE — Patient Instructions (Signed)
RTC for scheduled appointment.

## 2012-01-06 ENCOUNTER — Encounter: Payer: Self-pay | Admitting: *Deleted

## 2012-01-06 NOTE — Progress Notes (Signed)
Pt in with c/o wound drainage at LUQ port site.  Site is mildly erythemic, circular area 2mm. There is mild induration. Upon compressing the site a drop of cream colored fluid is expressed.  Pt advised to use warm compress TID and apply neosporin.  Following d/w Dr Duard Brady rx for keflex 500mg  BID x 7 days no refills is called to pts pharmacy, [facility] @ [phone].  Reportable signs and symptoms reviewed.

## 2012-01-29 ENCOUNTER — Ambulatory Visit: Payer: PRIVATE HEALTH INSURANCE | Attending: Gynecologic Oncology | Admitting: Gynecologic Oncology

## 2012-01-29 ENCOUNTER — Encounter: Payer: Self-pay | Admitting: Gynecologic Oncology

## 2012-01-29 VITALS — BP 96/64 | HR 62 | Temp 98.1°F | Resp 16 | Ht 66.02 in | Wt 127.4 lb

## 2012-01-29 DIAGNOSIS — Z8249 Family history of ischemic heart disease and other diseases of the circulatory system: Secondary | ICD-10-CM | POA: Insufficient documentation

## 2012-01-29 DIAGNOSIS — Z881 Allergy status to other antibiotic agents status: Secondary | ICD-10-CM | POA: Insufficient documentation

## 2012-01-29 DIAGNOSIS — Z860100 Personal history of colon polyps, unspecified: Secondary | ICD-10-CM | POA: Insufficient documentation

## 2012-01-29 DIAGNOSIS — C549 Malignant neoplasm of corpus uteri, unspecified: Secondary | ICD-10-CM | POA: Insufficient documentation

## 2012-01-29 DIAGNOSIS — Z90710 Acquired absence of both cervix and uterus: Secondary | ICD-10-CM | POA: Insufficient documentation

## 2012-01-29 DIAGNOSIS — K648 Other hemorrhoids: Secondary | ICD-10-CM | POA: Insufficient documentation

## 2012-01-29 DIAGNOSIS — Z8349 Family history of other endocrine, nutritional and metabolic diseases: Secondary | ICD-10-CM | POA: Insufficient documentation

## 2012-01-29 DIAGNOSIS — F411 Generalized anxiety disorder: Secondary | ICD-10-CM | POA: Insufficient documentation

## 2012-01-29 DIAGNOSIS — IMO0002 Reserved for concepts with insufficient information to code with codable children: Secondary | ICD-10-CM

## 2012-01-29 DIAGNOSIS — K5730 Diverticulosis of large intestine without perforation or abscess without bleeding: Secondary | ICD-10-CM | POA: Insufficient documentation

## 2012-01-29 DIAGNOSIS — Z808 Family history of malignant neoplasm of other organs or systems: Secondary | ICD-10-CM | POA: Insufficient documentation

## 2012-01-29 DIAGNOSIS — Z800 Family history of malignant neoplasm of digestive organs: Secondary | ICD-10-CM | POA: Insufficient documentation

## 2012-01-29 DIAGNOSIS — Z9079 Acquired absence of other genital organ(s): Secondary | ICD-10-CM | POA: Insufficient documentation

## 2012-01-29 DIAGNOSIS — Z8041 Family history of malignant neoplasm of ovary: Secondary | ICD-10-CM | POA: Insufficient documentation

## 2012-01-29 DIAGNOSIS — K644 Residual hemorrhoidal skin tags: Secondary | ICD-10-CM | POA: Insufficient documentation

## 2012-01-29 NOTE — Progress Notes (Signed)
Consult Note: Gyn-Onc  Kristie Howell 61 y.o. female  CC:  Chief Complaint  Patient presents with  . Endo ca    Follow up    HPI: Patient is a very pleasant 61 year old gravida 2 para 2 (12 are ages 73 and 66) was menopause about 2-1/2 years ago. She was on a combination hormone replacement therapy consisting of Vivelle-Dot we daily Prometrium. She states that this past summer she started having some vaginal bleeding and reported this to her previous gynecologist who performed and after sound that was unremarkable. She was reassured and or supplements to 4 months. She had recurrent bleeding in January of this year reported this to your gynecologist has similar performed a repeat ultrasound that was reassuring and was recommended that she change her hormone replacement therapy. She was uncomfortable with this and she went to see Dr. Eda Paschal.  He performed an ultrasound in saline effusion hysterogram they reveal a retroverted sac septated uterus with a right endometrial cavity measuring 4.5 mm in the left endometrial cavity measuring 5 mm. A biopsy was performed it revealed complex atypical hyperplasia and focal endometrioid adenocarcinoma grade 1.   Interval History:  She underwent a total robotic hysterectomy bilateral salpingo-oophorectomy left pelvic lymph node dissection) clinical dissection omental biopsy and peritoneal biopsies on 12/10/2011. Operative findings included a globular uterus which was retroflexed secondary to adhesions in the cul-de-sac. It was a 4 cm complex ovarian mass on the left side with significant amount of surface excrescence. Frozen section was consistent with at least a low malignant potential tumor. There was no tumor within the uterus on frozen section. She had a normal appearing appendix, no peritoneal disease, and no significant adenopathy.   Final pathology within the uterus revealed a grade 1 endometrioid adenocarcinoma superficially invading into the underlying  myometrium 0.1 cm out of 2.0 cm. There is no evidence of any lymphovascular space involvement. There were fibroids. The cervix was negative. Bilateral fallopian tubes were negative. Bilateral tumors were noted on the adnexa. The left ovary had a serous borderline tumor with microinvasion. The right ovary had a serous borderline tumor. Zero out of two left pelvic, 0/5 left para-aortic nodes were negative. There is no tumor in the omentum or any of the peritoneal based biopsies. Her CA 125 was checked immediately postoperatively was elevated at 191.9. Final [place] staging included a stage IA grade 1 endometrioid adenocarcinoma and a stage IC ovarian borderline tumor. There was microinvasion of the left side. The right side had a borderline tumor with no microinvasion.   I last saw her on June 5 discussion of pathology and then June 11 concern of an issue with her right lower quadrant port site. Her exam at that time was normal; she had been concerned of a hernia but was most consistent with postoperative induration. She comes in today for her final postoperative check. She's overall doing quite well. Her energy level is improving. She does state that she needs hormone replacement in some fashion she's not sleeping secondary to night sweats. She feels that the heat is coming up from her legs up and her chest. She states is really struggling. She knows that it would be preferable to not be on any hormone replacement therapy with her low malignant potential tumor of the ovary but at this point it is really very much interfering with her quality of life. She denies any vaginal bleeding. She has occasional twingy type pain with activity. She has normal bowel and bladder function. She  has not resumed sexual activity.   Current Meds:  Outpatient Encounter Prescriptions as of 01/29/2012  Medication Sig Dispense Refill  . acetaminophen (TYLENOL) 500 MG tablet Take 500 mg by mouth as needed.      . zolpidem (AMBIEN) 10 MG  tablet 1/2 - 1 tablet orally at bedtime as needed for insomnia  20 tablet  1  . Calcium Carbonate-Vitamin D (CALCIUM + D PO) Take by mouth daily.       . fish oil-omega-3 fatty acids 1000 MG capsule Take 1 g by mouth 2 (two) times daily.         Allergy:  Allergies  Allergen Reactions  . Sulfa Antibiotics Swelling and Rash    Social Hx:   History   Social History  . Marital Status: Married    Spouse Name: N/A    Number of Children: 2  . Years of Education: N/A   Occupational History  . homemaker    Social History Main Topics  . Smoking status: Never Smoker   . Smokeless tobacco: Never Used  . Alcohol Use: 1.0 oz/week    2 drink(s) per week     one drink per week.  . Drug Use: No  . Sexually Active: Yes -- Female partner(s)    Birth Control/ Protection: Post-menopausal   Other Topics Concern  . Not on file   Social History Narrative   Lives with husband.  Son in [place], daughter in August, [place]    Past Surgical Hx:  Past Surgical History  Procedure Date  . Cesarean section 1985 1987    x2  . Lymph node dissection 12/10/2011    Procedure: LYMPH NODE DISSECTION;  Surgeon: Rejeana Brock A. Duard Brady, MD;  Location: [facility];  Service: Gynecology;  Laterality: N/A;  Possible Lymph Nodes  . Abdominal hysterectomy     Total robotic hysterectomy bilateral salpingo-oophorectomy, left pelvic lymph node dissection and left para-aortic lymph node dissection, omental biopsy, peritoneal biopsy    Past Medical Hx:  Past Medical History  Diagnosis Date  . PMB (postmenopausal bleeding)   . Hemorrhoids     internal/external seen on colonoscopy 02/2011  . Diverticulosis 02/2011  . Hyperplastic colon polyp 02/2011  . Insomnia 2012  . Abnormal TSH     borderline; followed by Dr. Zenda Alpers testing was normal-yearly checks  . Anxiety 12-04-11    prone to panic attacks if stressed, heart rate increases  . PONV (postoperative nausea and vomiting)     Family Hx:  Family History  Problem  Relation Age of Onset  . Thyroid disease Mother   . Melanoma Mother   . Cancer Father 52    Colon cancer  . Heart attack Maternal Grandmother   . Ovarian cancer Paternal Grandmother     Vitals:  Blood pressure 96/64, pulse 62, temperature 98.1 F (36.7 C), temperature source Oral, resp. rate 16, height 5' 6.02" (1.677 m), weight 127 lb 6.4 oz (57.788 kg).  Physical Exam: Well-nourished well-developed female in no acute distress.  Abdomen: Well-healed surgical incisions. Abdomen is soft and nontender.  Pelvic: Normal external female genitalia. The vagina is atrophic. Vaginal cuff is visualized. The vaginal cuff is well-healed. Bimanual examination was no fluctuance or tenderness.  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 The microinvasion as did on the left ovarian tumor the right side is a borderline tumor with no microinvasion. She is doing well postoperatively.  Plan: We discussed trying  a short trial of hormone replacement therapy. She will start taking Premarin 0.3 mg daily for a few weeks and when she feels better and is sleeping better she'll start weaning herself every other day. She'll contact me if this issue with this. Discussed with her that short term hormone replacement therapy at a low dose should not be a significant issue with regards to any type of recurrent disease. She will return to see me in 3 months.  Maloni Musleh A., MD 01/29/2012, 1:19 PM

## 2012-01-29 NOTE — Patient Instructions (Signed)
RTC 3 months

## 2012-01-30 ENCOUNTER — Encounter: Payer: Self-pay | Admitting: Family Medicine

## 2012-01-30 ENCOUNTER — Ambulatory Visit (INDEPENDENT_AMBULATORY_CARE_PROVIDER_SITE_OTHER): Payer: PRIVATE HEALTH INSURANCE | Admitting: Family Medicine

## 2012-01-30 VITALS — BP 150/90 | HR 104 | Wt 129.0 lb

## 2012-01-30 DIAGNOSIS — S61219A Laceration without foreign body of unspecified finger without damage to nail, initial encounter: Secondary | ICD-10-CM

## 2012-01-30 DIAGNOSIS — S61209A Unspecified open wound of unspecified finger without damage to nail, initial encounter: Secondary | ICD-10-CM

## 2012-01-30 NOTE — Progress Notes (Signed)
  Subjective:    Patient ID: Kristie Howell, female    DOB: February 08, 1951, 61 y.o.   MRN: [id number]  HPI She sustained a laceration to the medial aspect of the left fifth finger near the MCP joint. She is up-to-date on her tetanus.  Review of Systems     Objective:   Physical Exam 1.5 cm laceration noted at the medial aspect of the left fifth finger near the MCP. Strength, motion and sensation are normal.       Assessment & Plan:  Finger laceration. The wound was cleaned and dressed. She will call if she has further difficulties.

## 2012-03-17 ENCOUNTER — Telehealth: Payer: Self-pay | Admitting: Gynecologic Oncology

## 2012-03-17 ENCOUNTER — Telehealth: Payer: Self-pay | Admitting: Internal Medicine

## 2012-03-17 NOTE — Telephone Encounter (Signed)
Okay to refill #20, no refill

## 2012-03-17 NOTE — Telephone Encounter (Signed)
Pt stated at her last visit she was prescribed Premarin for 3 mths to assist with symptom management.  She stated that she was "so afraid" to start taking the premarin due to fears of withdraw symptoms after the three month trial.  She states that she feels like she is in "a dark hole" with hot flashes 4 to 5 times nightly.  She is also complaining of vaginal dryness.  She stated she would be willing to try Effexor if approved with Dr. Duard Brady.  Dr. Duard Brady contacted via email about the above information.  Pt informed that she will contacted with Dr. Denman George recommendation when available.

## 2012-03-18 ENCOUNTER — Telehealth: Payer: Self-pay | Admitting: Gynecologic Oncology

## 2012-03-18 ENCOUNTER — Telehealth: Payer: Self-pay | Admitting: Internal Medicine

## 2012-03-18 ENCOUNTER — Other Ambulatory Visit: Payer: Self-pay | Admitting: *Deleted

## 2012-03-18 DIAGNOSIS — C541 Malignant neoplasm of endometrium: Secondary | ICD-10-CM

## 2012-03-18 DIAGNOSIS — G4700 Insomnia, unspecified: Secondary | ICD-10-CM

## 2012-03-18 MED ORDER — ZOLPIDEM TARTRATE 10 MG PO TABS: ORAL_TABLET | ORAL | Status: DC

## 2012-03-18 MED ORDER — VENLAFAXINE HCL ER 37.5 MG PO CP24: 37.5000 mg | ORAL_CAPSULE | Freq: Every day | ORAL | Status: DC

## 2012-03-18 MED ORDER — ESTROGENS, CONJUGATED 0.625 MG/GM VA CREA: TOPICAL_CREAM | VAGINAL | Status: DC

## 2012-03-18 NOTE — Telephone Encounter (Signed)
Error

## 2012-03-18 NOTE — Telephone Encounter (Signed)
Pt informed of Dr. Denman George recommendations.  Pt to begin Effexor XR 37.5mg  one daily to assist with hot flashes and mood swings.  For vaginal dryness, pt instructed to begin premarin vaginal cream 1g three times a week.  Instructed to call the office for any questions or concerns.

## 2012-03-18 NOTE — Telephone Encounter (Signed)
Phoned in.

## 2012-03-19 NOTE — Telephone Encounter (Signed)
done

## 2012-05-14 ENCOUNTER — Ambulatory Visit: Payer: PRIVATE HEALTH INSURANCE | Admitting: Lab

## 2012-05-14 ENCOUNTER — Encounter: Payer: Self-pay | Admitting: Gynecologic Oncology

## 2012-05-14 ENCOUNTER — Ambulatory Visit: Payer: PRIVATE HEALTH INSURANCE | Attending: Gynecologic Oncology | Admitting: Gynecologic Oncology

## 2012-05-14 VITALS — BP 110/70 | HR 66 | Temp 98.1°F | Resp 16 | Ht 65.04 in | Wt 126.9 lb

## 2012-05-14 DIAGNOSIS — Z860100 Personal history of colon polyps, unspecified: Secondary | ICD-10-CM | POA: Insufficient documentation

## 2012-05-14 DIAGNOSIS — Z9079 Acquired absence of other genital organ(s): Secondary | ICD-10-CM | POA: Insufficient documentation

## 2012-05-14 DIAGNOSIS — Z8041 Family history of malignant neoplasm of ovary: Secondary | ICD-10-CM | POA: Insufficient documentation

## 2012-05-14 DIAGNOSIS — C541 Malignant neoplasm of endometrium: Secondary | ICD-10-CM

## 2012-05-14 DIAGNOSIS — Z800 Family history of malignant neoplasm of digestive organs: Secondary | ICD-10-CM | POA: Insufficient documentation

## 2012-05-14 DIAGNOSIS — Z808 Family history of malignant neoplasm of other organs or systems: Secondary | ICD-10-CM | POA: Insufficient documentation

## 2012-05-14 DIAGNOSIS — D4959 Neoplasm of unspecified behavior of other genitourinary organ: Secondary | ICD-10-CM | POA: Insufficient documentation

## 2012-05-14 DIAGNOSIS — Z90710 Acquired absence of both cervix and uterus: Secondary | ICD-10-CM | POA: Insufficient documentation

## 2012-05-14 DIAGNOSIS — K5730 Diverticulosis of large intestine without perforation or abscess without bleeding: Secondary | ICD-10-CM | POA: Insufficient documentation

## 2012-05-14 DIAGNOSIS — Z882 Allergy status to sulfonamides status: Secondary | ICD-10-CM | POA: Insufficient documentation

## 2012-05-14 DIAGNOSIS — F411 Generalized anxiety disorder: Secondary | ICD-10-CM | POA: Insufficient documentation

## 2012-05-14 DIAGNOSIS — Z8249 Family history of ischemic heart disease and other diseases of the circulatory system: Secondary | ICD-10-CM | POA: Insufficient documentation

## 2012-05-14 DIAGNOSIS — C549 Malignant neoplasm of corpus uteri, unspecified: Secondary | ICD-10-CM | POA: Insufficient documentation

## 2012-05-14 DIAGNOSIS — Z8489 Family history of other specified conditions: Secondary | ICD-10-CM | POA: Insufficient documentation

## 2012-05-14 NOTE — Progress Notes (Signed)
Consult Note: Gyn-Onc  Kristie Howell 61 y.o. female  CC:  Chief Complaint  Patient presents with  . Endo ca    Follow up    HPI: Patient is a very pleasant 61 year old gravida 2 para 2 (12 are ages 48 and 34) was menopause about 2-1/2 years ago. She was on a combination hormone replacement therapy consisting of Vivelle-Dot we daily Prometrium. She states that this past summer she started having some vaginal bleeding and reported this to her previous gynecologist who performed and after sound that was unremarkable. She was reassured and or supplements to 4 months. She had recurrent bleeding in January of this year reported this to your gynecologist has similar performed a repeat ultrasound that was reassuring and was recommended that she change her hormone replacement therapy. She was uncomfortable with this and she went to see Dr. Eda Paschal. He performed an ultrasound in saline effusion hysterogram they reveal a retroverted sac septated uterus with a right endometrial cavity measuring 4.5 mm in the left endometrial cavity measuring 5 mm. A biopsy was performed it revealed complex atypical hyperplasia and focal endometrioid adenocarcinoma grade 1.   She underwent a total robotic hysterectomy bilateral salpingo-oophorectomy left pelvic lymph node dissection) clinical dissection omental biopsy and peritoneal biopsies on 12/10/2011. Operative findings included a globular uterus which was retroflexed secondary to adhesions in the cul-de-sac. It was a 4 cm complex ovarian mass on the left side with significant amount of surface excrescence. Frozen section was consistent with at least a low malignant potential tumor. There was no tumor within the uterus on frozen section. She had a normal appearing appendix, no peritoneal disease, and no significant adenopathy.   Final pathology within the uterus revealed a grade 1 endometrioid adenocarcinoma superficially invading into the underlying myometrium 0.1 cm out  of 2.0 cm. There is no evidence of any lymphovascular space involvement. There were fibroids. The cervix was negative. Bilateral fallopian tubes were negative. Bilateral tumors were noted on the adnexa. The left ovary had a serous borderline tumor with microinvasion. The right ovary had a serous borderline tumor. Zero out of two left pelvic, 0/5 left para-aortic nodes were negative. There is no tumor in the omentum or any of the peritoneal based biopsies. Her CA 125 was checked immediately postoperatively was elevated at 191.9. Final [place] staging included a stage IA grade 1 endometrioid adenocarcinoma and a stage IC ovarian borderline tumor. There was microinvasion of the left side. The right side had a borderline tumor with no microinvasion.   Interval History:  She's overall doing quite well. Her energy level is improving. She does state that she needs hormone replacement in some fashion she's not sleeping secondary to night sweats. She feels that the heat is coming up from her legs up and her chest. She states is really struggling. She knows that it would be preferable to not be on any hormone replacement therapy with her low malignant potential tumor of the ovary but at this point it is really very much interfering with her quality of life. She tried vaginal estrogen and had some spotting so she stopped, she did not like the effexor. She does not want to use her Remus Loffler due to addiction. She is feeling very anxious and wonders if taking something for anxiety may be best. She denies any vaginal bleeding at this time. She has occasional twingy type pain with activity. She has normal bowel and bladder function. She has pain and is getting anxious with intercourse.  She  believes that it is due to dryness. They have used KY jelly without success.  Review of Systems: As above and otherwise 10 system review is negative.  Current Meds:  Outpatient Encounter Prescriptions as of 05/14/2012  Medication Sig Dispense  Refill  . acetaminophen (TYLENOL) 500 MG tablet Take 500 mg by mouth as needed.      . Calcium Carbonate-Vitamin D (CALCIUM + D PO) Take by mouth daily.       . fish oil-omega-3 fatty acids 1000 MG capsule Take 1 g by mouth 2 (two) times daily.       Marland Kitchen zolpidem (AMBIEN) 10 MG tablet 1/2 - 1 tablet orally at bedtime as needed for insomnia  20 tablet  0  . conjugated estrogens (PREMARIN) vaginal cream Place vaginally 3 (three) times a week. 1 gm per vagina 3 times a week for vaginal dryness. Please dispense applicator.  42.5 g  12  . venlafaxine XR (EFFEXOR-XR) 37.5 MG 24 hr capsule Take 1 capsule (37.5 mg total) by mouth daily.  30 capsule  2    Allergy:  Allergies  Allergen Reactions  . Sulfa Antibiotics Swelling and Rash    Social Hx:   History   Social History  . Marital Status: Married    Spouse Name: N/A    Number of Children: 2  . Years of Education: N/A   Occupational History  . homemaker    Social History Main Topics  . Smoking status: Never Smoker   . Smokeless tobacco: Never Used  . Alcohol Use: 1.0 oz/week    2 drink(s) per week     one drink per week.  . Drug Use: No  . Sexually Active: Yes -- Female partner(s)    Birth Control/ Protection: Post-menopausal   Other Topics Concern  . Not on file   Social History Narrative   Lives with husband.  Son in [place], daughter in [place], [place]    Past Surgical Hx:  Past Surgical History  Procedure Date  . Cesarean section 1985 1987    x2  . Lymph node dissection 12/10/2011    Procedure: LYMPH NODE DISSECTION;  Surgeon: Rejeana Brock A. Duard Brady, MD;  Location: [facility];  Service: Gynecology;  Laterality: N/A;  Possible Lymph Nodes  . Abdominal hysterectomy     Total robotic hysterectomy bilateral salpingo-oophorectomy, left pelvic lymph node dissection and left para-aortic lymph node dissection, omental biopsy, peritoneal biopsy    Past Medical Hx:  Past Medical History  Diagnosis Date  . PMB (postmenopausal bleeding)   .  Hemorrhoids     internal/external seen on colonoscopy 02/2011  . Diverticulosis 02/2011  . Hyperplastic colon polyp 02/2011  . Insomnia 2012  . Abnormal TSH     borderline; followed by Dr. Zenda Alpers testing was normal-yearly checks  . Anxiety 12-04-11    prone to panic attacks if stressed, heart rate increases  . PONV (postoperative nausea and vomiting)     Family Hx:  Family History  Problem Relation Age of Onset  . Thyroid disease Mother   . Melanoma Mother   . Cancer Father 10    Colon cancer  . Heart attack Maternal Grandmother   . Ovarian cancer Paternal Grandmother     Vitals:  Blood pressure 110/70, pulse 66, temperature 98.1 F (36.7 C), temperature source Oral, resp. rate 16, height 5' 5.04" (1.652 m), weight 126 lb 14.4 oz (57.561 kg).  Physical Exam: Well-nourished well-developed female in no acute distress.  Neck: Supple, no lymphadenopathy, no thyromegaly.  Lungs: Clear to auscultation bilaterally.  Cardiovascular: Regular rate and rhythm.  Abdomen: Well-healed surgical incisions. Abdomen is soft, nontender, nondistended. There is no palpable masses. There is no hepatosplenomegaly. There are no incisional hernias.  Groins: No lymphadenopathy.  Extremities: No edema.  Pelvic: Normal external female genitalia. Vagina is atrophic. At the top of the vaginal cuff there is some granulation tissue. After obtaining the patient's verbal consent the area was biopsied away. She tolerated it well. The biopsy will be sent for pathology. Silver nitrate was applied. Bimanual examination reveals no masses or nodularity. Rectal confirms.  Assessment/Plan:  61 year old with this clinical stage IA 1 endometrioid adenocarcinoma who also has a stage IC ovarian borderline tumor with elevated CA 125. There was microinvasion on the left side. We will followup in results of her CA 125 from today as well as a result of the vaginal biopsy. She was given a prescription for Ativan 0.5 mg to  take twice daily when necessary. She was told not to drive or drink alcohol with the Ativan. She return to see me in 6 months or when necessary. She will schedule her mammogram. Cleda Mccreedy A., MD 05/14/2012, 10:43 AM

## 2012-05-14 NOTE — Patient Instructions (Signed)
RTC 6 months

## 2012-05-15 LAB — CA 125: CA 125: 11.6 U/mL (ref 0.0–30.2)

## 2012-05-18 ENCOUNTER — Telehealth: Payer: Self-pay | Admitting: *Deleted

## 2012-05-18 NOTE — Telephone Encounter (Signed)
Patient notified of Path results and lab result.

## 2012-06-01 ENCOUNTER — Other Ambulatory Visit: Payer: Self-pay | Admitting: Family Medicine

## 2012-06-01 NOTE — Telephone Encounter (Signed)
Is this okay to refill? 

## 2012-06-01 NOTE — Telephone Encounter (Signed)
Left message informing patient of this.  °

## 2012-06-01 NOTE — Telephone Encounter (Signed)
Last filled early September.  She previously used maybe 2x/week.  Looks like frequency has increased.  Okay to refill, but if needing this frequently, recommend OV to discuss.  Per April visit, used only 1/2-1 tab twice a week prn.  Okay to fill

## 2012-06-23 ENCOUNTER — Other Ambulatory Visit: Payer: Self-pay | Admitting: Orthopedic Surgery

## 2012-06-25 ENCOUNTER — Other Ambulatory Visit: Payer: Self-pay | Admitting: Orthopedic Surgery

## 2012-06-26 ENCOUNTER — Encounter (HOSPITAL_BASED_OUTPATIENT_CLINIC_OR_DEPARTMENT_OTHER)
Admission: RE | Disposition: A | Discharge status: Home / Self Care | Payer: Self-pay | Source: Ambulatory Visit | Attending: Orthopedic Surgery

## 2012-06-26 ENCOUNTER — Ambulatory Visit (HOSPITAL_BASED_OUTPATIENT_CLINIC_OR_DEPARTMENT_OTHER)
Admission: RE | Admit: 2012-06-26 | Discharge: 2012-06-26 | Disposition: A | Discharge status: Home / Self Care | Payer: PRIVATE HEALTH INSURANCE | Source: Ambulatory Visit | Attending: Orthopedic Surgery | Admitting: Orthopedic Surgery

## 2012-06-26 DIAGNOSIS — M24049 Loose body in unspecified finger joint(s): Secondary | ICD-10-CM | POA: Insufficient documentation

## 2012-06-26 DIAGNOSIS — R29898 Other symptoms and signs involving the musculoskeletal system: Secondary | ICD-10-CM | POA: Insufficient documentation

## 2012-06-26 DIAGNOSIS — L609 Nail disorder, unspecified: Secondary | ICD-10-CM | POA: Insufficient documentation

## 2012-06-26 HISTORY — PX: MASS EXCISION: SHX2000

## 2012-06-26 SURGERY — MINOR EXCISION OF MASS
Anesthesia: LOCAL | Site: Finger | Laterality: Left | Wound class: Clean

## 2012-06-26 MED ORDER — TRAMADOL HCL 50 MG PO TABS: ORAL_TABLET | ORAL | Status: DC

## 2012-06-26 MED ORDER — CHLORHEXIDINE GLUCONATE 4 % EX LIQD: 60.0000 mL | Freq: Once | CUTANEOUS | Status: DC

## 2012-06-26 MED ORDER — CEPHALEXIN 500 MG PO CAPS: 500.0000 mg | ORAL_CAPSULE | Freq: Three times a day (TID) | ORAL | Status: DC

## 2012-06-26 MED ORDER — LIDOCAINE HCL 2 % IJ SOLN
INTRAMUSCULAR | Status: DC | PRN
  Administered 2012-06-26: 5 mL

## 2012-06-26 SURGICAL SUPPLY — 37 items
BANDAGE ADHESIVE 1X3 (GAUZE/BANDAGES/DRESSINGS) IMPLANT
BLADE SURG 15 STRL LF DISP TIS (BLADE) ×1 IMPLANT
BLADE SURG 15 STRL SS (BLADE) ×1
BNDG COHESIVE 1X5 TAN STRL LF (GAUZE/BANDAGES/DRESSINGS) IMPLANT
BNDG ELASTIC 2 VLCR STRL LF (GAUZE/BANDAGES/DRESSINGS) ×2 IMPLANT
BNDG ESMARK 4X9 LF (GAUZE/BANDAGES/DRESSINGS) IMPLANT
BRUSH SCRUB EZ PLAIN DRY (MISCELLANEOUS) ×2 IMPLANT
CLOTH BEACON ORANGE TIMEOUT ST (SAFETY) ×2 IMPLANT
CORDS BIPOLAR (ELECTRODE) IMPLANT
COVER MAYO STAND STRL (DRAPES) ×2 IMPLANT
CUFF TOURNIQUET SINGLE 18IN (TOURNIQUET CUFF) IMPLANT
DECANTER SPIKE VIAL GLASS SM (MISCELLANEOUS) IMPLANT
DRAIN PENROSE 1/2X12 LTX STRL (WOUND CARE) IMPLANT
DRAPE SURG 17X23 STRL (DRAPES) ×2 IMPLANT
GAUZE SPONGE 4X4 12PLY STRL LF (GAUZE/BANDAGES/DRESSINGS) ×4 IMPLANT
GAUZE XEROFORM 1X8 LF (GAUZE/BANDAGES/DRESSINGS) IMPLANT
GLOVE BIO SURGEON STRL SZ 6.5 (GLOVE) ×4 IMPLANT
GLOVE BIOGEL M STRL SZ7.5 (GLOVE) ×2 IMPLANT
GLOVE ORTHO TXT STRL SZ7.5 (GLOVE) ×2 IMPLANT
GOWN BRE IMP PREV XXLGXLNG (GOWN DISPOSABLE) ×4 IMPLANT
GOWN PREVENTION PLUS XLARGE (GOWN DISPOSABLE) ×6 IMPLANT
NEEDLE 27GAX1X1/2 (NEEDLE) ×2 IMPLANT
PACK BASIN DAY SURGERY FS (CUSTOM PROCEDURE TRAY) ×2 IMPLANT
PADDING CAST ABS 4INX4YD NS (CAST SUPPLIES) ×1
PADDING CAST ABS COTTON 4X4 ST (CAST SUPPLIES) ×1 IMPLANT
SPONGE GAUZE 4X4 12PLY (GAUZE/BANDAGES/DRESSINGS) ×2 IMPLANT
STOCKINETTE 4X48 STRL (DRAPES) ×2 IMPLANT
STRIP CLOSURE SKIN 1/2X4 (GAUZE/BANDAGES/DRESSINGS) IMPLANT
SUT ETHILON 5 0 P 3 18 (SUTURE) ×1
SUT NYLON ETHILON 5-0 P-3 1X18 (SUTURE) ×1 IMPLANT
SUT PROLENE 4 0 P 3 18 (SUTURE) IMPLANT
SYR 3ML 23GX1 SAFETY (SYRINGE) IMPLANT
SYR CONTROL 10ML LL (SYRINGE) ×2 IMPLANT
TOWEL OR 17X24 6PK STRL BLUE (TOWEL DISPOSABLE) ×4 IMPLANT
TRAY DSU PREP LF (CUSTOM PROCEDURE TRAY) ×2 IMPLANT
UNDERPAD 30X30 INCONTINENT (UNDERPADS AND DIAPERS) ×2 IMPLANT
WATER STERILE IRR 1000ML POUR (IV SOLUTION) IMPLANT

## 2012-06-26 NOTE — Op Note (Signed)
[id number]  

## 2012-06-26 NOTE — Brief Op Note (Signed)
06/26/2012  10:37 AM  PATIENT:  Gerome Apley Quiett  61 y.o. female  PRE-OPERATIVE DIAGNOSIS:  mucoid cyst left long finger, loose body radial aspect of dip joint  POST-OPERATIVE DIAGNOSIS:  mucoid cyst left long finger, loose body radial aspect of dip joint   PROCEDURE:  Procedure(s) (LRB) with comments: MINOR EXCISION OF MASS (Left) - mucoid cyst left long finger   SURGEON:  Surgeon(s) and Role:    * Wyn Forster., MD - Primary  PHYSICIAN ASSISTANT:   ASSISTANTS:  Mallory Shirk.A-C    ANESTHESIA:   local  EBL:     BLOOD ADMINISTERED:none  DRAINS: none   LOCAL MEDICATIONS USED:  XYLOCAINE   SPECIMEN:  No Specimen  DISPOSITION OF SPECIMEN:  N/A  COUNTS:  YES  TOURNIQUET:  * Missing tourniquet times found for documented tourniquets in log:  [id number] *  DICTATION: .Other Dictation: Dictation Number [id number]  PLAN OF CARE: Discharge to home after PACU  PATIENT DISPOSITION:  PACU - hemodynamically stable.

## 2012-06-26 NOTE — H&P (Signed)
  Kristie Howell returns for follow up evaluation of a chronic mucoid cyst affecting the left long finger radial nail plate. 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 She has had a year long history of nail grooving of the left long finger radial nail plate. She saw her dermatologist who recommended a hand surgery consult.   Her past history is reviewed in detail. She has an allergy to Sulfa. She is on no routine medications. She was identified with endometrial cancer in 2013 and a robotic hysterectomy. She has done very well. She is married. She is a nonsmoker. She enjoys a rare alcoholic beverage. Her family history is detailed and positive for her mother having high BP and arthritis. A 14 system review of systems reveals corrective lenses.   Physical exam reveals a very well appearing 61 year old woman who looks much younger than her stated age. Inspection of her finger reveals a mucoid cyst presenting at the radial nail fold with a full length nail groove. She has Heberden's nodes that are quite subtle. She has full ROM.   X-rays of her hand demonstrate a loose body within her DIP joint of the left long finger radial aspect.   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  Plan: I have advised Kristie Howell to proceed with DIP joint debridement through a radial dorsal incision. We will remove her loose body and irrigate the joint. Questions were invited and answered in detail.   H&P documentation: 06/26/2012  -History and Physical Reviewed  -Patient has been re-examined  -No change in the plan of care  Wyn Forster, MD

## 2012-06-29 ENCOUNTER — Encounter (HOSPITAL_BASED_OUTPATIENT_CLINIC_OR_DEPARTMENT_OTHER): Payer: Self-pay | Admitting: Orthopedic Surgery

## 2012-06-29 NOTE — Op Note (Signed)
NAME:  Kristie Howell, Kristie Howell NO.:  [id number]  MEDICAL RECORD NO.:  [id number]  LOCATION:                                 FACILITY:  PHYSICIAN:  Kristie Howell, M.D. DATE OF BIRTH:  07/05/1951  DATE OF PROCEDURE:  06/26/2012 DATE OF DISCHARGE:                              OPERATIVE REPORT   PREOPERATIVE DIAGNOSES:  Chronic mucoid cyst, left long finger with radial nail plate groove/deformity due to chronic pressure on nail mechanism.  Also x-ray evidence of radial sided osteochondral loose body, right long finger distal interphalangeal joint.  POSTOPERATIVE DIAGNOSES:  Chronic mucoid cyst, left long finger with radial nail plate groove/deformity due to chronic pressure on nail mechanism. Also x-ray evidence of radial sided osteochondral loose body, right long finger distal interphalangeal joint.  Identification of loose body and debris in distal interphalangeal joint.  OPERATION: 1. Decompression of radial dorsal mucoid cyst, left long finger. 2. DIP joint arthrotomy with debridement and loose body     removal/synovectomy with irrigation utilizing a 19-gauge blunt     dental needle and sterile saline.  OPERATING SURGEON:  Kristie Fitch. Laquandra Carrillo, MD  ASSISTANT:  Marveen Reeks Dasnoit, PA-C  ANESTHESIA:  Lidocaine 2% and metacarpal head level block of left long finger.  This was performed as a minor operating room procedure.  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  Kristie Howell to have an x-ray of her finger which demonstrated a loose body at the radial aspect of the distal interphalangeal joint deep to the radial collateral ligament.  Kristie recommended DIP joint debridement and cyst decompression.  After informed consent, she is brought to the operating room at  this time.  PROCEDURE:  Kristie Howell is brought to room 1 of the [facility] and placed in supine position on the operating table.  Detailed informed consent was provided in the holding area as well at the office.  In room 1 after Betadine prep, 4 mL of 2% lidocaine infiltrated at the base of the finger as a digital block.  After 5 minutes, excellent anesthesia was achieved.  The left hand and arm were then prepped with Betadine soap and solution, sterilely draped.  A 0.5-inch Penrose drain was placed after exsanguination of long finger at the proximal phalangeal level as a digital tourniquet.  After routine surgical time-out, Kristie checked for proper anesthesia and identified a satisfactory absence of sensation.  A curvilinear incision was fashioned beginning over the radial aspect of the middle phalangeal head and extending towards the mucoid cyst at the dorsal nail fold.  Subcutaneous tissues were carefully divided taking care to identify the terminal extensor tendon slip and the radial collateral ligament.  A triangular capsulotomy was performed followed by synovectomy and removal of debris from the DIP joint.  A small osteocartilaginous loose body identified deep to the radial collateral ligament was removed with a micro curette.  Kristie then used a micro rongeur to decompress the cyst and remove the membrane  of the cyst over the dorsal aspect of the nail mechanism.  The cyst cavity and the joint were then thoroughly irrigated with a 19- gauge blunt dental needle followed by repair of the skin with intradermal 4-0 Prolene.  A Steri-Strip was applied followed by application of compressive finger dressing.  Kristie have advised Kristie Howell to remove the dressing in 3 days and begin using Band-Aids.  For aftercare, she is provided a prescription for Ultram 50 mg 1 p.o. q.4-6 hours p.r.n. pain, 20 tablets without refill.  Kristie have encouraged her to use Motrin 600 mg q.6 hours p.r.n.  pain and Tylenol as needed prior to using the prescription medication.  She is also provided Keflex 500 mg 1 p.o. q.8 hours x4 days, a prophylactic antibiotic due to joint entry.     Kristie Fitch Tempest Frankland, M.D.     RVS/MEDQ  D:  06/26/2012  T:  06/26/2012  Job:  [id number]

## 2012-11-19 ENCOUNTER — Encounter: Payer: Self-pay | Admitting: Gynecologic Oncology

## 2012-11-19 ENCOUNTER — Other Ambulatory Visit: Payer: PRIVATE HEALTH INSURANCE | Admitting: Lab

## 2012-11-19 ENCOUNTER — Ambulatory Visit: Payer: PRIVATE HEALTH INSURANCE | Attending: Gynecologic Oncology | Admitting: Gynecologic Oncology

## 2012-11-19 VITALS — BP 118/68 | HR 80 | Temp 98.2°F | Resp 18 | Ht 65.04 in | Wt 130.9 lb

## 2012-11-19 DIAGNOSIS — C541 Malignant neoplasm of endometrium: Secondary | ICD-10-CM

## 2012-11-19 DIAGNOSIS — C549 Malignant neoplasm of corpus uteri, unspecified: Secondary | ICD-10-CM | POA: Insufficient documentation

## 2012-11-19 NOTE — Patient Instructions (Signed)
Return to clinic in 6 months.

## 2012-11-19 NOTE — Progress Notes (Signed)
Consult Note: Gyn-Onc  Kristie Howell 62 y.o. female  CC:  Chief Complaint  Patient presents with  . Endometrial Cancer    Follow up    HPI: Patient is a very pleasant 62 year old gravida 2 para 2 (12 are ages 41 and 63) was menopause about 2-1/2 years ago. She was on a combination hormone replacement therapy consisting of Vivelle-Dot we daily Prometrium. She states that this past summer she started having some vaginal bleeding and reported this to her previous gynecologist who performed and after sound that was unremarkable. She was reassured and or supplements to 4 months. She had recurrent bleeding in January of this year reported this to your gynecologist has similar performed a repeat ultrasound that was reassuring and was recommended that she change her hormone replacement therapy. She was uncomfortable with this and she went to see Dr. Eda Paschal. He performed an ultrasound in saline effusion hysterogram they reveal a retroverted sac septated uterus with a right endometrial cavity measuring 4.5 mm in the left endometrial cavity measuring 5 mm. A biopsy was performed it revealed complex atypical hyperplasia and focal endometrioid adenocarcinoma grade 1.   She underwent a total robotic hysterectomy bilateral salpingo-oophorectomy left pelvic lymph node dissection) clinical dissection omental biopsy and peritoneal biopsies on 12/10/2011. Operative findings included a globular uterus which was retroflexed secondary to adhesions in the cul-de-sac. It was a 4 cm complex ovarian mass on the left side with significant amount of surface excrescence. Frozen section was consistent with at least a low malignant potential tumor. There was no tumor within the uterus on frozen section. She had a normal appearing appendix, no peritoneal disease, and no significant adenopathy.   Final pathology within the uterus revealed a grade 1 endometrioid adenocarcinoma superficially invading into the underlying myometrium  0.1 cm out of 2.0 cm. There is no evidence of any lymphovascular space involvement. There were fibroids. The cervix was negative. Bilateral fallopian tubes were negative. Bilateral tumors were noted on the adnexa. The left ovary had a serous borderline tumor with microinvasion. The right ovary had a serous borderline tumor. Zero out of two left pelvic, 0/5 left para-aortic nodes were negative. There is no tumor in the omentum or any of the peritoneal based biopsies. Her CA 125 was checked immediately postoperatively was elevated at 191.9. Final [place] staging included a stage IA grade 1 endometrioid adenocarcinoma and a stage IC ovarian borderline tumor. There was microinvasion of the left side. The right side had a borderline tumor with no microinvasion.   Interval History:   She states she's doing very well. She still has some difficulty sleeping but overall the depression that she was experiencing after her surgery is markedly improved. She states that she struggled a great deal of her diagnoses as her sister-in-law had been diagnosed with a close proximity to her diagnosis and died within 7 weeks. She occasionally still needs to take the medicine for anxiety but that is much less than once a the week.  Her son recently got married. She and her husband are planning a 42 of birth a party for themselves with a trip. She is up-to-date on her mammograms and is due for one in September. There are no new medical problems in the family.  Review of Systems: As above and otherwise 10 system review is negative. She does have a small amount of vaginal dryness. It is relieved with Premarin vaginal cream. She has no vaginal bleeding. She has a change in her bowel or bladder  habits.  Current Meds:  Outpatient Encounter Prescriptions as of 11/19/2012  Medication Sig Dispense Refill  . acetaminophen (TYLENOL) 500 MG tablet Take 500 mg by mouth as needed.      . Calcium Carbonate-Vitamin D (CALCIUM + D PO) Take by mouth  daily.       . fish oil-omega-3 fatty acids 1000 MG capsule Take 1 g by mouth daily.       Marland Kitchen zolpidem (AMBIEN) 10 MG tablet TAKE 1/2 TO 1 TABLET AT BEDTIME AS      NEEDED FOR INSOMNIA  20 tablet  0  . [DISCONTINUED] cephALEXin (KEFLEX) 500 MG capsule Take 1 capsule (500 mg total) by mouth 3 (three) times daily.  12 capsule  0  . [DISCONTINUED] traMADol (ULTRAM) 50 MG tablet 1 tab every 4 hours as needed for pain  20 tablet  0   No facility-administered encounter medications on file as of 11/19/2012.    Allergy:  Allergies  Allergen Reactions  . Sulfa Antibiotics Swelling and Rash    Social Hx:   History   Social History  . Marital Status: Married    Spouse Name: N/A    Number of Children: 2  . Years of Education: N/A   Occupational History  . homemaker    Social History Main Topics  . Smoking status: Never Smoker   . Smokeless tobacco: Never Used  . Alcohol Use: 1.0 oz/week    2 drink(s) per week     Comment: one drink per week.  . Drug Use: No  . Sexually Active: Yes -- Female partner(s)    Birth Control/ Protection: Post-menopausal   Other Topics Concern  . Not on file   Social History Narrative   Lives with husband.  Son in [place], daughter in [place], [place]    Past Surgical Hx:  Past Surgical History  Procedure Laterality Date  . Cesarean section  1985 1987    x2  . Lymph node dissection  12/10/2011    Procedure: LYMPH NODE DISSECTION;  Surgeon: Rejeana Brock A. Duard Brady, MD;  Location: [facility];  Service: Gynecology;  Laterality: N/A;  Possible Lymph Nodes  . Abdominal hysterectomy      Total robotic hysterectomy bilateral salpingo-oophorectomy, left pelvic lymph node dissection and left para-aortic lymph node dissection, omental biopsy, peritoneal biopsy  . Mass excision  06/26/2012    Procedure: MINOR EXCISION OF MASS;  Surgeon: Wyn Forster., MD;  Location: [facility];  Service: Orthopedics;  Laterality: Left;  mucoid cyst left long finger   . Finger  surgery      left middle finger    Past Medical Hx:  Past Medical History  Diagnosis Date  . PMB (postmenopausal bleeding)   . Hemorrhoids     internal/external seen on colonoscopy 02/2011  . Diverticulosis 02/2011  . Hyperplastic colon polyp 02/2011  . Insomnia 2012  . Abnormal TSH     borderline; followed by Dr. Zenda Alpers testing was normal-yearly checks  . Anxiety 12-04-11    prone to panic attacks if stressed, heart rate increases  . PONV (postoperative nausea and vomiting)     Family Hx:  Family History  Problem Relation Age of Onset  . Thyroid disease Mother   . Melanoma Mother   . Cancer Father 27    Colon cancer  . Heart attack Maternal Grandmother   . Ovarian cancer Paternal Grandmother     Vitals:  Blood pressure 118/68, pulse 80, temperature 98.2 F (36.8 C), resp.  rate 18, height 5' 5.04" (1.652 m), weight 130 lb 14.4 oz (59.376 kg).  Physical Exam: Well-nourished well-developed female in no acute distress.  Neck: Supple, no lymphadenopathy, no thyromegaly.  Lungs: Clear to auscultation bilaterally.  Cardiovascular: Regular rate and rhythm.  Abdomen: Well-healed surgical incisions. Abdomen is soft, nontender, nondistended. There is no palpable masses. There is no hepatosplenomegaly. There are no incisional hernias.  Groins: No lymphadenopathy.  Extremities: No edema.  Pelvic: Normal external female genitalia. Vagina is atrophic. There are no visible lesions. Bimanual examination reveals no masses or nodularity. Rectal confirms.  Assessment/Plan:  62 year old with this clinical stage IA 1 endometrioid adenocarcinoma who also has a stage IC ovarian borderline tumor with elevated CA 125. There was microinvasion on the left side. We will followup in results of her CA 125 from today as well as a result of the vaginal biopsy.  She return to see me in 6 months or when necessary.   Aulden Calise A., MD 11/19/2012, 9:54 AM

## 2012-11-20 LAB — CA 125: CA 125: 9.7 U/mL (ref 0.0–30.2)

## 2012-11-24 ENCOUNTER — Telehealth: Payer: Self-pay | Admitting: Gynecologic Oncology

## 2012-11-24 NOTE — Telephone Encounter (Signed)
Patient notified of CA 125 results: 9.7. Instructed to call for any questions or concerns.

## 2013-04-12 ENCOUNTER — Encounter: Payer: Self-pay | Admitting: Family Medicine

## 2013-04-12 ENCOUNTER — Ambulatory Visit (INDEPENDENT_AMBULATORY_CARE_PROVIDER_SITE_OTHER): Payer: PRIVATE HEALTH INSURANCE | Admitting: Family Medicine

## 2013-04-12 VITALS — BP 120/70 | HR 80 | Ht 65.5 in | Wt 133.0 lb

## 2013-04-12 DIAGNOSIS — G4700 Insomnia, unspecified: Secondary | ICD-10-CM

## 2013-04-12 DIAGNOSIS — M533 Sacrococcygeal disorders, not elsewhere classified: Secondary | ICD-10-CM

## 2013-04-12 MED ORDER — NAPROXEN 500 MG PO TABS: 500.0000 mg | ORAL_TABLET | Freq: Two times a day (BID) | ORAL | Status: DC

## 2013-04-12 MED ORDER — ZOLPIDEM TARTRATE 10 MG PO TABS: ORAL_TABLET | ORAL | Status: DC

## 2013-04-12 NOTE — Patient Instructions (Addendum)
Do the stretches as shown.  Use the naproxen twice daily with food in place of ibuprofen.  You may use tylenol with this, if needed for pain, but no other pain medications.  If not improving with these measures alone, consider chiropractor.  I personally love Dr. Thereasa Distance at [facility] on [address], but talk to your friends for other recommendations  Sacroiliac Joint Dysfunction The sacroiliac joint connects the lower part of the spine (the sacrum) with the bones of the pelvis. CAUSES  Sometimes, there is no obvious reason for sacroiliac joint dysfunction. Other times, it may occur   During pregnancy.  After injury, such as:  Car accidents.  Sport-related injuries.  Work-related injuries.  Due to one leg being shorter than the other.  Due to other conditions that affect the joints, such as:  Rheumatoid arthritis.  Gout.  Psoriasis.  Joint infection (septic arthritis). SYMPTOMS  Symptoms may include:  Pain in the:  Lower back.  Buttocks.  Groin.  Thighs and legs.  Difficult sitting, standing, walking, lying, bending or lifting. DIAGNOSIS  A number of tests may be used to help diagnose the cause of sacroiliac joint dysfunction, including:  Imaging tests to look for other causes of pain, including:  MRI.  CT scan.  Bone scan.  Diagnostic injection: During a special x-ray (called fluoroscopy), a needle is put into the sacroiliac joint. A numbing medicine is injected into the joint. If the pain is improved or stopped, the diagnosis of sacroiliac joint dysfunction is more likely. TREATMENT  There are a number of types of treatment used for sacroiliac joint dysfunction, including:  Only take over-the-counter or prescription medicines for pain, discomfort, or fever as directed by your caregiver.  Medications to relax muscles.  Rest. Decreasing activity can help cut down on painful muscle spasms and allow the back to heal.  Application of heat or ice to the  lower back may improve muscle spasms and soothe pain.  Brace. A special back brace, called a sacroiliac belt, can help support the joint while your back is healing.  Physical therapy can help teach comfortable positions and exercises to strengthen muscles that support the sacroiliac joint.  Cortisone injections. Injections of steroid medicine into the joint can help decrease swelling and improve pain.  Hyaluronic acid injections. This chemical improves lubrication within the sacroiliac joint, thereby decreasing pain.  Radiofrequency ablation. A special needle is placed into the joint, where it burns away nerves that are carrying pain messages from the joint.  Surgery. Because pain occurs during movement of the joint, screws and plates may be installed in order to limit or prevent joint motion. HOME CARE INSTRUCTIONS   Take all medications exactly as directed.  Follow instructions regarding both rest and physical activity, to avoid worsening the pain.  Do physical therapy exercises exactly as prescribed. SEEK IMMEDIATE MEDICAL CARE IF:  You experience increasingly severe pain.  You develop new symptoms, such as numbness or tingling in your legs or feet.  You lose bladder or bowel control. Document Released: 09/27/2008 Document Revised: 09/23/2011 Document Reviewed: 09/27/2008 [facility] Patient Information 2014 [place], [place].

## 2013-04-12 NOTE — Progress Notes (Signed)
Chief Complaint  Patient presents with  . Hip Pain    right hip pain. Patient states that she had seen Dr.Kloie Whiting about 2.5 years ago for this-did go away for a while. Pain has been back over the last 6 months.,   Complaining of right hip pain for 6 months.  It is worse with sitting, better with standing, worse after walking.  Pain goes down the back of her thigh, to behind her knee.  No pain in the calf at all, but also having pain in her arch of her foot. Pain is in right buttock area.  She has tried heat (no help) and Advil.  She has taken 800mg  of advil once at night, but only a few times.  The 800mg  bothered her stomach some, tolerates 400 better. Occasionally she will get sharp pains in the right foot and some tingling.  Pain is described as an "ache".  Denies any leg weakness.  Recalls similar episode 2-3 years ago which self resolved.  She is asking for refil on zolpidem.  Has spurts where she has bouts of insomnia, and requires ambien. She has been out of the [place] since May (last rx'd #20 in November by her GYN-onc).  Past Medical History  Diagnosis Date  . PMB (postmenopausal bleeding)   . Hemorrhoids     internal/external seen on colonoscopy 02/2011  . Diverticulosis 02/2011  . Hyperplastic colon polyp 02/2011  . Insomnia 2012  . Abnormal TSH     borderline; followed by Dr. Zenda Alpers testing was normal-yearly checks  . Anxiety 12-04-11    prone to panic attacks if stressed, heart rate increases  . PONV (postoperative nausea and vomiting)   . Ovarian tumor of borderline malignancy    Past Surgical History  Procedure Laterality Date  . Cesarean section  1985 1987    x2  . Lymph node dissection  12/10/2011    Procedure: LYMPH NODE DISSECTION;  Surgeon: Rejeana Brock A. Duard Brady, MD;  Location: [facility];  Service: Gynecology;  Laterality: N/A;  Possible Lymph Nodes  . Abdominal hysterectomy      Total robotic hysterectomy bilateral salpingo-oophorectomy, left pelvic lymph node dissection and  left para-aortic lymph node dissection, omental biopsy, peritoneal biopsy  . Mass excision  06/26/2012    Procedure: MINOR EXCISION OF MASS;  Surgeon: Wyn Forster., MD;  Location: [facility];  Service: Orthopedics;  Laterality: Left;  mucoid cyst left long finger   . Finger surgery      left middle finger (same as above)   History   Social History  . Marital Status: Married    Spouse Name: N/A    Number of Children: 2  . Years of Education: N/A   Occupational History  . homemaker    Social History Main Topics  . Smoking status: Never Smoker   . Smokeless tobacco: Never Used  . Alcohol Use: 1.0 oz/week    2 drink(s) per week     Comment: one drink per week.  . Drug Use: No  . Sexual Activity: Yes    Partners: Male    Birth Control/ Protection: Post-menopausal   Other Topics Concern  . Not on file   Social History Narrative   Lives with husband.  Son in [place], daughter in [place], [place]   Current outpatient prescriptions:Calcium Carbonate-Vitamin D (CALCIUM + D PO), Take by mouth daily. , Disp: , Rfl: ;  fish oil-omega-3 fatty acids 1000 MG capsule, Take 1 g by mouth daily. ,  Disp: , Rfl: ;  PREMARIN vaginal cream, Place 0.5 g vaginally as needed. , Disp: , Rfl: ;  acetaminophen (TYLENOL) 500 MG tablet, Take 500 mg by mouth as needed., Disp: , Rfl:  zolpidem (AMBIEN) 10 MG tablet, TAKE 1/2 TO 1 TABLET AT BEDTIME AS      NEEDED FOR INSOMNIA, Disp: 20 tablet, Rfl: 0  Allergies  Allergen Reactions  . Sulfa Antibiotics Swelling and Rash   ROS:  Denies fevers, chills, nausea, vomiting, chest pain,shortness of breath, rash, bleeding/bruising, numbness/tingling, other joint pains  PHYSICAL EXAM: BP 120/70  Pulse 80  Ht 5' 5.5" (1.664 m)  Wt 133 lb (60.328 kg)  BMI 21.79 kg/m2  Well developed, pleasant female in no distress No spinal tenderness or CVA tenderness.  Mild tenderness at R SI joint and sciatic notch Hypermobility of R SI joint noted. Some  pain with pyriformis stretching, but no significant spasm Normal strength, sensation.  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  Instructed on stretches.  Naproxen rx--NSAID precautions reviewed.   If not improving with these measures alone, consider chiropractor vs PT.  Reminded to get flu shot (can't give today as she had live virus vaccine (zostavax) 2 weeks ago.) Given zolpidem refill for her intermittent insomnia.  Risks/side effects reviewed.  Schedule CPE

## 2013-04-22 ENCOUNTER — Other Ambulatory Visit: Payer: Self-pay

## 2013-04-22 DIAGNOSIS — Z1231 Encounter for screening mammogram for malignant neoplasm of breast: Secondary | ICD-10-CM

## 2013-04-30 ENCOUNTER — Ambulatory Visit: Payer: PRIVATE HEALTH INSURANCE

## 2013-05-17 ENCOUNTER — Ambulatory Visit
Admission: RE | Admit: 2013-05-17 | Discharge: 2013-05-17 | Disposition: A | Discharge status: Home / Self Care | Payer: PRIVATE HEALTH INSURANCE | Source: Ambulatory Visit

## 2013-05-17 DIAGNOSIS — Z1231 Encounter for screening mammogram for malignant neoplasm of breast: Secondary | ICD-10-CM

## 2013-05-17 LAB — HM MAMMOGRAPHY: HM Mammogram: NORMAL

## 2013-05-18 ENCOUNTER — Ambulatory Visit: Payer: PRIVATE HEALTH INSURANCE

## 2013-05-20 ENCOUNTER — Encounter: Payer: Self-pay | Admitting: Gynecologic Oncology

## 2013-05-20 ENCOUNTER — Ambulatory Visit: Payer: PRIVATE HEALTH INSURANCE | Attending: Gynecologic Oncology | Admitting: Gynecologic Oncology

## 2013-05-20 ENCOUNTER — Other Ambulatory Visit (HOSPITAL_COMMUNITY)
Admission: RE | Admit: 2013-05-20 | Discharge: 2013-05-20 | Disposition: A | Discharge status: Home / Self Care | Payer: PRIVATE HEALTH INSURANCE | Source: Ambulatory Visit | Attending: Gynecologic Oncology | Admitting: Gynecologic Oncology

## 2013-05-20 ENCOUNTER — Other Ambulatory Visit: Payer: Self-pay

## 2013-05-20 ENCOUNTER — Ambulatory Visit: Payer: PRIVATE HEALTH INSURANCE | Admitting: Lab

## 2013-05-20 ENCOUNTER — Other Ambulatory Visit: Payer: Self-pay | Admitting: Gynecologic Oncology

## 2013-05-20 VITALS — BP 102/68 | HR 66 | Temp 98.7°F | Resp 16 | Ht 65.0 in | Wt 132.4 lb

## 2013-05-20 DIAGNOSIS — Z9079 Acquired absence of other genital organ(s): Secondary | ICD-10-CM | POA: Insufficient documentation

## 2013-05-20 DIAGNOSIS — D3910 Neoplasm of uncertain behavior of unspecified ovary: Secondary | ICD-10-CM

## 2013-05-20 DIAGNOSIS — C541 Malignant neoplasm of endometrium: Secondary | ICD-10-CM

## 2013-05-20 DIAGNOSIS — IMO0002 Reserved for concepts with insufficient information to code with codable children: Secondary | ICD-10-CM

## 2013-05-20 DIAGNOSIS — Z01419 Encounter for gynecological examination (general) (routine) without abnormal findings: Secondary | ICD-10-CM | POA: Insufficient documentation

## 2013-05-20 DIAGNOSIS — C549 Malignant neoplasm of corpus uteri, unspecified: Secondary | ICD-10-CM | POA: Insufficient documentation

## 2013-05-20 DIAGNOSIS — Z90710 Acquired absence of both cervix and uterus: Secondary | ICD-10-CM | POA: Insufficient documentation

## 2013-05-20 MED ORDER — LORAZEPAM 0.5 MG PO TABS: 0.5000 mg | ORAL_TABLET | Freq: Three times a day (TID) | ORAL | Status: DC | PRN

## 2013-05-20 MED ORDER — ESTROGENS, CONJUGATED 0.625 MG/GM VA CREA: 1.0000 | TOPICAL_CREAM | VAGINAL | Status: DC

## 2013-05-20 NOTE — Progress Notes (Signed)
Consult Note: Gyn-Onc  Kristie Howell 62 y.o. female  CC:  Chief Complaint  Patient presents with  . Endometrial cancer    Follow up    HPI: Patient is a very pleasant 62 year old gravida 2 para 2 (12 are ages 70 and 98) was menopause about 2-1/2 years ago. She was on a combination hormone replacement therapy consisting of Vivelle-Dot we daily Prometrium. She states that this past summer she started having some vaginal bleeding and reported this to her previous gynecologist who performed and after sound that was unremarkable. She was reassured and or supplements to 4 months. She had recurrent bleeding in January of this year reported this to your gynecologist has similar performed a repeat ultrasound that was reassuring and was recommended that she change her hormone replacement therapy. She was uncomfortable with this and she went to see Dr. Eda Paschal. He performed an ultrasound in saline effusion hysterogram they reveal a retroverted sac septated uterus with a right endometrial cavity measuring 4.5 mm in the left endometrial cavity measuring 5 mm. A biopsy was performed it revealed complex atypical hyperplasia and focal endometrioid adenocarcinoma grade 1.   She underwent a total robotic hysterectomy bilateral salpingo-oophorectomy left pelvic lymph node dissection) clinical dissection omental biopsy and peritoneal biopsies on 12/10/2011. Operative findings included a globular uterus which was retroflexed secondary to adhesions in the cul-de-sac. It was a 4 cm complex ovarian mass on the left side with significant amount of surface excrescence. Frozen section was consistent with at least a low malignant potential tumor. There was no tumor within the uterus on frozen section. She had a normal appearing appendix, no peritoneal disease, and no significant adenopathy.   Final pathology within the uterus revealed a grade 1 endometrioid adenocarcinoma superficially invading into the underlying myometrium  0.1 cm out of 2.0 cm. There is no evidence of any lymphovascular space involvement. There were fibroids. The cervix was negative. Bilateral fallopian tubes were negative. Bilateral tumors were noted on the adnexa. The left ovary had a serous borderline tumor with microinvasion. The right ovary had a serous borderline tumor. Zero out of two left pelvic, 0/5 left para-aortic nodes were negative. There is no tumor in the omentum or any of the peritoneal based biopsies. Her CA 125 was checked immediately postoperatively was elevated at 191.9. Final [place] staging included a stage IA grade 1 endometrioid adenocarcinoma and a stage IC ovarian borderline tumor. There was microinvasion of the left side. The right side had a borderline tumor with no microinvasion.   I last saw her in may 2014 at which time her exam was notable for small area of granulation tissue and a biopsy was negative. Her CA 125 was normal at 9.7.  Interval History:   She states she's doing very well.  She still complains of vaginal dryness. She is using her Premarin cream 0.2 times a week. The dryness is primarily with intercourse and she feels that she does benefit from the cream of ascites or 3 times a week. She does have some anxiety and hot flashes. She takes Ativan a couple times a week for this. She believes anxiety causes the hot flashes. She occasionally has some vaginal twinges depending on which way she turns in place. Intermittent is not getting any worse. She occasionally still needs to take the medicine for anxiety but that is much less than once a the week. She is up-to-date on her mammograms and has one on Monday. There are no new medical problems in the  family.  Review of Systems:  Constitutional:  Denies fever. Skin: No rash, sores, jaundice, itching, or dryness.  Cardiovascular: No chest pain, shortness of breath, or edema  Pulmonary: No cough or wheeze.  Gastro Intestinal:   No nausea, vomiting, constipation, or diarrhea  reported. No bright red blood per rectum or change in bowel movement.  Genitourinary: No frequency, urgency, or dysuria.  Denies vaginal bleeding and discharge.  Musculoskeletal: No myalgia, arthralgia, joint swelling or pain.  Neurologic: No weakness, numbness, or change in gait.  Psychology: As above   Current Meds:  Outpatient Encounter Prescriptions as of 05/20/2013  Medication Sig  . acetaminophen (TYLENOL) 500 MG tablet Take 500 mg by mouth as needed.  . Calcium Carbonate-Vitamin D (CALCIUM + D PO) Take by mouth daily.   . fish oil-omega-3 fatty acids 1000 MG capsule Take 1 g by mouth daily.   Marland Kitchen PREMARIN vaginal cream Place 0.5 g vaginally as needed.   . zolpidem (AMBIEN) 10 MG tablet TAKE 1/2 TO 1 TABLET AT BEDTIME AS      NEEDED FOR INSOMNIA  . naproxen (NAPROSYN) 500 MG tablet Take 1 tablet (500 mg total) by mouth 2 (two) times daily with a meal.    Allergy:  Allergies  Allergen Reactions  . Sulfa Antibiotics Swelling and Rash    Social Hx:   History   Social History  . Marital Status: Married    Spouse Name: N/A    Number of Children: 2  . Years of Education: N/A   Occupational History  . homemaker    Social History Main Topics  . Smoking status: Never Smoker   . Smokeless tobacco: Never Used  . Alcohol Use: 1.0 oz/week    2 drink(s) per week     Comment: one drink per week.  . Drug Use: No  . Sexual Activity: Yes    Partners: Male    Birth Control/ Protection: Post-menopausal   Other Topics Concern  . Not on file   Social History Narrative   Lives with husband.  Son in [place], daughter in [place], [place]    Past Surgical Hx:  Past Surgical History  Procedure Laterality Date  . Cesarean section  1985 1987    x2  . Lymph node dissection  12/10/2011    Procedure: LYMPH NODE DISSECTION;  Surgeon: Rejeana Brock A. Duard Brady, MD;  Location: [facility];  Service: Gynecology;  Laterality: N/A;  Possible Lymph Nodes  . Abdominal hysterectomy      Total robotic hysterectomy  bilateral salpingo-oophorectomy, left pelvic lymph node dissection and left para-aortic lymph node dissection, omental biopsy, peritoneal biopsy  . Mass excision  06/26/2012    Procedure: MINOR EXCISION OF MASS;  Surgeon: Wyn Forster., MD;  Location: [facility];  Service: Orthopedics;  Laterality: Left;  mucoid cyst left long finger   . Finger surgery      left middle finger (same as above)    Past Medical Hx:  Past Medical History  Diagnosis Date  . PMB (postmenopausal bleeding)   . Hemorrhoids     internal/external seen on colonoscopy 02/2011  . Diverticulosis 02/2011  . Hyperplastic colon polyp 02/2011  . Insomnia 2012  . Abnormal TSH     borderline; followed by Dr. Zenda Alpers testing was normal-yearly checks  . Anxiety 12-04-11    prone to panic attacks if stressed, heart rate increases  . PONV (postoperative nausea and vomiting)   . Ovarian tumor of borderline malignancy  Family Hx:  Family History  Problem Relation Age of Onset  . Thyroid disease Mother   . Melanoma Mother   . Cancer Father 39    Colon cancer  . Heart attack Maternal Grandmother   . Ovarian cancer Paternal Grandmother     Vitals:  Blood pressure 102/68, pulse 66, temperature 98.7 F (37.1 C), resp. rate 16, height 5\' 5"  (1.651 m), weight 132 lb 6.4 oz (60.056 kg).  Physical Exam: Well-nourished well-developed female in no acute distress.  Neck: Supple, no lymphadenopathy, no thyromegaly.  Lungs: Clear to auscultation bilaterally.  Cardiovascular: Regular rate and rhythm.  Abdomen: Well-healed surgical incisions. Abdomen is soft, nontender, nondistended. There is no palpable masses. There is no hepatosplenomegaly. There are no incisional hernias.  Groins: No lymphadenopathy.  Extremities: No edema.  Pelvic: Normal external female genitalia. Vagina is atrophic. There are no visible lesions. Bimanual examination reveals no masses or nodularity. Rectal  confirms.  Assessment/Plan:  62 year old with this clinical stage IA 1 endometrioid adenocarcinoma who also has a stage IC ovarian borderline tumor with elevated CA 125. There was microinvasion on the left side. We will followup in results of her CA 125 from today as well as a result of her pap smear.  I refilled her Ativan for when necessary use. Also refilled her Premarin cream. She return to see me in 6 months or when necessary.   Zaccheaus Storlie A., MD 05/20/2013, 10:21 AM

## 2013-05-20 NOTE — Patient Instructions (Signed)
Return to GYN oncology in 6 months. Please use the estrogen cream 3 times per week as discussed.

## 2013-05-21 ENCOUNTER — Telehealth: Payer: Self-pay | Admitting: Gynecologic Oncology

## 2013-05-21 LAB — CA 125: CA 125: 12.4 U/mL (ref 0.0–30.2)

## 2013-05-21 NOTE — Telephone Encounter (Signed)
Patient notified of CA 125 results.  No concerns voiced. 

## 2013-05-24 ENCOUNTER — Telehealth: Payer: Self-pay | Admitting: Gynecologic Oncology

## 2013-05-24 NOTE — Telephone Encounter (Signed)
Message left for patient with pap smear results: negative.  Instructed to call for any questions or concerns.  

## 2013-05-27 ENCOUNTER — Encounter: Payer: Self-pay | Admitting: *Deleted

## 2013-07-26 ENCOUNTER — Encounter: Payer: Self-pay | Admitting: Family Medicine

## 2013-07-26 ENCOUNTER — Ambulatory Visit (INDEPENDENT_AMBULATORY_CARE_PROVIDER_SITE_OTHER): Payer: PRIVATE HEALTH INSURANCE | Admitting: Family Medicine

## 2013-07-26 ENCOUNTER — Other Ambulatory Visit: Payer: Self-pay | Admitting: *Deleted

## 2013-07-26 ENCOUNTER — Telehealth: Payer: Self-pay | Admitting: *Deleted

## 2013-07-26 VITALS — BP 130/80 | HR 72 | Ht 64.0 in | Wt 134.0 lb

## 2013-07-26 DIAGNOSIS — G4700 Insomnia, unspecified: Secondary | ICD-10-CM

## 2013-07-26 DIAGNOSIS — S61219A Laceration without foreign body of unspecified finger without damage to nail, initial encounter: Secondary | ICD-10-CM

## 2013-07-26 DIAGNOSIS — S61209A Unspecified open wound of unspecified finger without damage to nail, initial encounter: Secondary | ICD-10-CM

## 2013-07-26 MED ORDER — ZOLPIDEM TARTRATE 10 MG PO TABS: ORAL_TABLET | ORAL | Status: DC

## 2013-07-26 NOTE — Telephone Encounter (Signed)
Called patient to let her know that I phoned in her ambien refill.

## 2013-07-26 NOTE — Telephone Encounter (Signed)
Patient forgot to ask if you would refill ambien to [facility].

## 2013-07-26 NOTE — Telephone Encounter (Signed)
Last given #20 3 months ago.  Ok to refill

## 2013-07-26 NOTE — Progress Notes (Signed)
Chief Complaint  Patient presents with  . Acute Visit    cut left pointer finger while cutting potatoes today.    Patient presents with complaint of bleeding laceration to her left 2nd fingertip.  She got a new knife for Christmas, and earlier today, while slicing potatoes, cut her finger.  Her last Tetanus was 2008.  She has been applying pressure, but has had ongoing bleeding.  She denies numbness/tingling  Immunization History  Administered Date(s) Administered  . Influenza Split 05/05/2013  . Tdap 03/23/2007  . Zoster 03/28/2013    Current Outpatient Prescriptions on File Prior to Visit  Medication Sig Dispense Refill  . Calcium Carbonate-Vitamin D (CALCIUM + D PO) Take by mouth daily.       Marland Kitchen conjugated estrogens (PREMARIN) vaginal cream Place 1 Applicatorful vaginally 3 (three) times a week.  90 g  4  . fish oil-omega-3 fatty acids 1000 MG capsule Take 1 g by mouth daily.       Marland Kitchen acetaminophen (TYLENOL) 500 MG tablet Take 500 mg by mouth as needed.      Marland Kitchen LORazepam (ATIVAN) 0.5 MG tablet Take 1 tablet (0.5 mg total) by mouth every 8 (eight) hours as needed for anxiety.  40 tablet  0  . naproxen (NAPROSYN) 500 MG tablet Take 1 tablet (500 mg total) by mouth 2 (two) times daily with a meal.  30 tablet  0   No current facility-administered medications on file prior to visit.   Allergies  Allergen Reactions  . Sulfa Antibiotics Swelling and Rash   ROS:  No fevers, URI symptoms, numbness, tingling, or other complaints  PHYSICAL EXAM:  BP 130/80  Pulse 72  Ht 5\' 4"  (1.626 m)  Wt 134 lb (60.782 kg)  BMI 22.99 kg/m2 Well developed, pleasant female in no distress  0.5cm fairly superficial laceration to lateral aspect of distal 2nd L phalanx, at the level of the mid-nail.  Bleeding from just the palmar-most aspect of the wound.  Bleeding was controlled with pressure, and adhesive glue for lacerations was applied.  Patient tolerated procedure well, without complication.  Hemostasis  was achieved. Normal sensation.  Brisk capillary refill  ASSESSMENT/PLAN:  Laceration of finger of left hand, initial encounter - Plan: PR REPR,FACE,GENITAL,HAND,FT+5 CMCM/<   Wound care reviewed. Tetanus UTD.  F/u as previously scheduled, sooner prn.

## 2013-08-26 ENCOUNTER — Ambulatory Visit (INDEPENDENT_AMBULATORY_CARE_PROVIDER_SITE_OTHER): Payer: PRIVATE HEALTH INSURANCE | Admitting: Family Medicine

## 2013-08-26 ENCOUNTER — Encounter: Payer: Self-pay | Admitting: Family Medicine

## 2013-08-26 VITALS — BP 102/68 | HR 68 | Ht 65.0 in | Wt 132.0 lb

## 2013-08-26 DIAGNOSIS — G4700 Insomnia, unspecified: Secondary | ICD-10-CM

## 2013-08-26 DIAGNOSIS — R5383 Other fatigue: Secondary | ICD-10-CM

## 2013-08-26 DIAGNOSIS — R5381 Other malaise: Secondary | ICD-10-CM

## 2013-08-26 DIAGNOSIS — Z13220 Encounter for screening for lipoid disorders: Secondary | ICD-10-CM

## 2013-08-26 DIAGNOSIS — R946 Abnormal results of thyroid function studies: Secondary | ICD-10-CM

## 2013-08-26 DIAGNOSIS — Z0000 Encounter for general adult medical examination without abnormal findings: Secondary | ICD-10-CM

## 2013-08-26 DIAGNOSIS — R7989 Other specified abnormal findings of blood chemistry: Secondary | ICD-10-CM

## 2013-08-26 LAB — POCT URINALYSIS DIPSTICK
BILIRUBIN UA: NEGATIVE
GLUCOSE UA: NEGATIVE
KETONES UA: NEGATIVE
Leukocytes, UA: NEGATIVE
NITRITE UA: NEGATIVE
Protein, UA: NEGATIVE
SPEC GRAV UA: 1.02
Urobilinogen, UA: NEGATIVE
pH, UA: 5

## 2013-08-26 NOTE — Patient Instructions (Signed)

## 2013-08-26 NOTE — Progress Notes (Signed)
Chief Complaint  Patient presents with  . Annual Exam    annual exam, had a smoothie this am. No pap needed see Dr.Gehrig every 6 months.UA showed 1+ blood, no symptoms.  Is having some issues with sleep-cannot get to sleep.  Also still having some intermittent right hip pain.   Kristie Howell is a 63 y.o. female who presents for a complete physical.  She has no specific complaints.  Her hip/low back pain is improved overall, intermittent.  F/u insomnia:  Lorrin Mais has been very helpful. She uses 1/2 tablet up to 3x/week as needed.  She also has rx for lorazepam that she uses periodically for anxiety, very infrequently, and this also helps with sleep.  She takes this for anxiety(rare)  Immunization History  Administered Date(s) Administered  . Influenza Split 05/05/2013  . Tdap 03/23/2007  . Zoster 03/28/2013   Last Pap smear:  Yearly Dr. Alycia Rossetti (05/2013), sees her every 6 mos Last mammogram: 05/2013 Last colonoscopy: 01/2011, Dr. Watt Climes (due again 2016-17 due to FHx) Last DEXA: never Ophtho: yearly (04/2013) Dentist: every 6 months Exercise:  Less through the winter months.  Usual routine is walking 1-2 miles daily (in nicer weather). Age of menopause was 28; no family h/o osteoporosis  Past Medical History  Diagnosis Date  . PMB (postmenopausal bleeding)   . Hemorrhoids     internal/external seen on colonoscopy 02/2011  . Diverticulosis 02/2011  . Hyperplastic colon polyp 02/2011    Dr. Watt Climes q23yr (+FHx)  . Insomnia 2012  . Abnormal TSH     borderline; yearly checks  . Anxiety 12-04-11    prone to panic attacks if stressed, heart rate increases  . PONV (postoperative nausea and vomiting)   . Ovarian tumor of borderline malignancy     Past Surgical History  Procedure Laterality Date  . Cesarean section  1985 1987    x2  . Lymph node dissection  12/10/2011    Procedure: LYMPH NODE DISSECTION;  Surgeon: Imagene Gurney A. Alycia Rossetti, MD;  Location: [facility];  Service: Gynecology;  Laterality: N/A;   Possible Lymph Nodes  . Abdominal hysterectomy      Total robotic hysterectomy bilateral salpingo-oophorectomy, left pelvic lymph node dissection and left para-aortic lymph node dissection, omental biopsy, peritoneal biopsy  . Mass excision  06/26/2012    Procedure: MINOR EXCISION OF MASS;  Surgeon: Cammie Sickle., MD;  Location: [facility];  Service: Orthopedics;  Laterality: Left;  mucoid cyst left long finger   . Finger surgery      left middle finger (same as above)    History   Social History  . Marital Status: Married    Spouse Name: N/A    Number of Children: 2  . Years of Education: N/A   Occupational History  . homemaker    Social History Main Topics  . Smoking status: Never Smoker   . Smokeless tobacco: Never Used  . Alcohol Use: 1.0 oz/week    2 drink(s) per week     Comment: one drink per week.  . Drug Use: No  . Sexual Activity: Yes    Partners: Male    Birth Control/ Protection: Post-menopausal   Other Topics Concern  . Not on file   Social History Narrative   Lives with husband.  Son in [place], daughter in [place], [place]    Family History  Problem Relation Age of Onset  . Thyroid disease Mother   . Melanoma Mother   . Diabetes Mother  borderline (in her 90's)  . Cancer Father 24    Colon cancer  . Heart attack Maternal Grandmother   . Ovarian cancer Paternal Grandmother   . Breast cancer Neg Hx    Outpatient Encounter Prescriptions as of 08/26/2013  Medication Sig  . cholecalciferol (VITAMIN D) 1000 UNITS tablet Take 1,000 Units by mouth daily.  Marland Kitchen conjugated estrogens (PREMARIN) vaginal cream Place 1 Applicatorful vaginally 3 (three) times a week.  . fish oil-omega-3 fatty acids 1000 MG capsule Take 1 g by mouth daily.   Marland Kitchen LORazepam (ATIVAN) 0.5 MG tablet Take 1 tablet (0.5 mg total) by mouth every 8 (eight) hours as needed for anxiety.  Marland Kitchen zolpidem (AMBIEN) 10 MG tablet TAKE 1/2 TO 1 TABLET AT BEDTIME AS      NEEDED FOR  INSOMNIA  . acetaminophen (TYLENOL) 500 MG tablet Take 500 mg by mouth as needed.  . [DISCONTINUED] Calcium Carbonate-Vitamin D (CALCIUM + D PO) Take by mouth daily.   . [DISCONTINUED] naproxen (NAPROSYN) 500 MG tablet Take 1 tablet (500 mg total) by mouth 2 (two) times daily with a meal.     Allergies  Allergen Reactions  . Sulfa Antibiotics Swelling and Rash    ROS: The patient denies anorexia, fever, weight changes, headaches,  vision changes, decreased hearing, ear pain, sore throat, breast concerns, chest pain, palpitations, dizziness, syncope, dyspnea on exertion, cough, swelling, nausea, vomiting, diarrhea, constipation, abdominal pain, melena, hematochezia, indigestion/heartburn, hematuria, incontinence, dysuria, vaginal bleeding, discharge, odor or itch, genital lesions, joint pains (just low back--see below); no numbness, tingling (rarely in her foot when hip is hurting), weakness, tremor, suspicious skin lesions, depression, anxiety (intermittent), abnormal bleeding/bruising, or enlarged lymph nodes. Intermittent, mild palpitations (chronic). Occasional right-sided low back pain, into buttocks, relieved by prn advil (which caused bruising).  Pain hasn't gone down to the knee lately, like it has in the past.  PHYSICAL EXAM: BP 102/68  Pulse 68  Ht 5\' 5"  (1.651 m)  Wt 132 lb (59.875 kg)  BMI 21.97 kg/m2  General Appearance:    Alert, cooperative, no distress, appears stated age  Head:    Normocephalic, without obvious abnormality, atraumatic  Eyes:    PERRL, conjunctiva/corneas clear, EOM's intact, fundi    benign  Ears:    Normal TM's and external ear canals  Nose:   Nares normal, mucosa normal, no drainage or sinus   tenderness  Throat:   Lips, mucosa, and tongue normal; teeth and gums normal  Neck:   Supple, no lymphadenopathy;  thyroid:  no   enlargement/tenderness/nodules; no carotid   bruit or JVD  Back:    Spine nontender, no curvature, ROM normal, no CVA      Tenderness. Mild tenderness at R SI joint. No muscle spasm, no pyriformis spasm  Lungs:     Clear to auscultation bilaterally without wheezes, rales or     ronchi; respirations unlabored  Chest Wall:    No tenderness or deformity   Heart:    Regular rate and rhythm, S1 and S2 normal, no murmur, rub   or gallop  Breast Exam:    Deferred to GYN  Abdomen:     Soft, non-tender, nondistended, normoactive bowel sounds,    no masses, no hepatosplenomegaly  Genitalia:    Deferred to GYN     Extremities:   No clubbing, cyanosis or edema  Pulses:   2+ and symmetric all extremities  Skin:   Skin color, texture, turgor normal, no rashes or lesions  Lymph  nodes:   Cervical, supraclavicular, and axillary nodes normal  Neurologic:   CNII-XII intact, normal strength, sensation and gait; reflexes 2+ and symmetric throughout          Psych:   Normal mood, affect, hygiene and grooming.    ASSESSMENT/PLAN:  Routine general medical examination at a health care facility - Plan: POCT Urinalysis Dipstick, Visual acuity screening, TSH, Vit D  25 hydroxy (rtn osteoporosis monitoring), Lipid panel, CBC with Differential, Comprehensive metabolic panel  Insomnia  Other malaise and fatigue - Plan: TSH, Vit D  25 hydroxy (rtn osteoporosis monitoring), CBC with Differential, Comprehensive metabolic panel  Abnormal TSH - Plan: TSH  Screening for lipoid disorders - Plan: Lipid panel  Insomnia--continues to be intermittent at this point. Reviewed use of ambien--continue to use 1/2 tablet as needed.  Discussed lorazepam vs ambien (prefer to use ambien for sleep, lorazepam just for anxiety, not to use together).  If insomnia becomes more constant, recommend change to the ambien CR 6.25mg  tablet, and pt will call to change rx.  Currently doing well on prn use of regular ambien.  Discussed monthly self breast exams and yearly mammograms after the age of 69; at least 30 minutes of aerobic activity at least 5 days/week;  proper sunscreen use reviewed; healthy diet, including goals of calcium and vitamin D intake and alcohol recommendations (less than or equal to 1 drink/day) reviewed; regular seatbelt use; changing batteries in smoke detectors.  Immunization recommendations discussed--pneumonia vaccine at age 70; yearly flu shots.  Others are UTD.  Colonoscopy recommendations reviewed--UTD.  DEXA age 33 (currently only 4 years postmenopausal).  Return for fasting labs

## 2013-09-06 ENCOUNTER — Other Ambulatory Visit: Payer: PRIVATE HEALTH INSURANCE

## 2013-09-06 DIAGNOSIS — R5383 Other fatigue: Secondary | ICD-10-CM

## 2013-09-06 DIAGNOSIS — Z13220 Encounter for screening for lipoid disorders: Secondary | ICD-10-CM

## 2013-09-06 DIAGNOSIS — R7989 Other specified abnormal findings of blood chemistry: Secondary | ICD-10-CM

## 2013-09-06 DIAGNOSIS — Z0000 Encounter for general adult medical examination without abnormal findings: Secondary | ICD-10-CM

## 2013-09-06 DIAGNOSIS — R5381 Other malaise: Secondary | ICD-10-CM

## 2013-09-06 LAB — COMPREHENSIVE METABOLIC PANEL
ALT: 24 U/L (ref 0–35)
AST: 20 U/L (ref 0–37)
Albumin: 4.4 g/dL (ref 3.5–5.2)
Alkaline Phosphatase: 77 U/L (ref 39–117)
BILIRUBIN TOTAL: 0.8 mg/dL (ref 0.2–1.2)
BUN: 15 mg/dL (ref 6–23)
CO2: 30 meq/L (ref 19–32)
CREATININE: 0.66 mg/dL (ref 0.50–1.10)
Calcium: 9.2 mg/dL (ref 8.4–10.5)
Chloride: 104 mEq/L (ref 96–112)
GLUCOSE: 82 mg/dL (ref 70–99)
Potassium: 4.3 mEq/L (ref 3.5–5.3)
Sodium: 140 mEq/L (ref 135–145)
Total Protein: 7 g/dL (ref 6.0–8.3)

## 2013-09-06 LAB — CBC WITH DIFFERENTIAL/PLATELET
Basophils Absolute: 0 10*3/uL (ref 0.0–0.1)
Basophils Relative: 1 % (ref 0–1)
EOS ABS: 0.2 10*3/uL (ref 0.0–0.7)
EOS PCT: 4 % (ref 0–5)
HCT: 42 % (ref 36.0–46.0)
Hemoglobin: 14.4 g/dL (ref 12.0–15.0)
LYMPHS ABS: 1.5 10*3/uL (ref 0.7–4.0)
Lymphocytes Relative: 35 % (ref 12–46)
MCH: 30.5 pg (ref 26.0–34.0)
MCHC: 34.3 g/dL (ref 30.0–36.0)
MCV: 89 fL (ref 78.0–100.0)
Monocytes Absolute: 0.3 10*3/uL (ref 0.1–1.0)
Monocytes Relative: 7 % (ref 3–12)
Neutro Abs: 2.3 10*3/uL (ref 1.7–7.7)
Neutrophils Relative %: 53 % (ref 43–77)
PLATELETS: 200 10*3/uL (ref 150–400)
RBC: 4.72 MIL/uL (ref 3.87–5.11)
RDW: 13.9 % (ref 11.5–15.5)
WBC: 4.3 10*3/uL (ref 4.0–10.5)

## 2013-09-06 LAB — LIPID PANEL
CHOL/HDL RATIO: 3.6 ratio
CHOLESTEROL: 206 mg/dL — AB (ref 0–200)
HDL: 58 mg/dL (ref >39)
LDL Cholesterol: 128 mg/dL — ABNORMAL HIGH (ref 0–99)
TRIGLYCERIDES: 100 mg/dL (ref <150)
VLDL: 20 mg/dL (ref 0–40)

## 2013-09-07 LAB — TSH: TSH: 3.6 u[IU]/mL (ref 0.350–4.500)

## 2013-09-07 LAB — VITAMIN D 25 HYDROXY (VIT D DEFICIENCY, FRACTURES): VIT D 25 HYDROXY: 53 ng/mL (ref 30–89)

## 2013-09-28 ENCOUNTER — Telehealth: Payer: Self-pay | Admitting: Family Medicine

## 2013-09-28 ENCOUNTER — Other Ambulatory Visit: Payer: Self-pay | Admitting: Family Medicine

## 2013-09-28 DIAGNOSIS — G4700 Insomnia, unspecified: Secondary | ICD-10-CM

## 2013-09-28 MED ORDER — ZOLPIDEM TARTRATE ER 6.25 MG PO TBCR: 6.2500 mg | EXTENDED_RELEASE_TABLET | Freq: Every evening | ORAL | Status: DC | PRN

## 2013-09-28 NOTE — Telephone Encounter (Signed)
I called out Ambien Per Dr. Tomi Bamberger and I also put the order's in the system. CLS

## 2013-09-28 NOTE — Telephone Encounter (Signed)
Pt left message on my voice mail that she needs the Ambien CR now and you were going to note in her chart if she needed it.  Please send to [facility].  Also please call pt when done [phone]

## 2013-09-28 NOTE — Telephone Encounter (Signed)
Please phone in and document in orders rx for ambien CR 6.25 mg, 1 po qHS prn insomnia #30 with 2 refills.

## 2013-11-25 ENCOUNTER — Ambulatory Visit: Payer: PRIVATE HEALTH INSURANCE

## 2013-11-25 ENCOUNTER — Other Ambulatory Visit: Payer: PRIVATE HEALTH INSURANCE

## 2013-11-25 ENCOUNTER — Encounter: Payer: Self-pay | Admitting: Gynecologic Oncology

## 2013-11-25 ENCOUNTER — Ambulatory Visit: Payer: PRIVATE HEALTH INSURANCE | Attending: Gynecologic Oncology | Admitting: Gynecologic Oncology

## 2013-11-25 VITALS — BP 116/83 | HR 101 | Temp 98.1°F | Resp 18 | Ht 65.0 in | Wt 129.5 lb

## 2013-11-25 DIAGNOSIS — IMO0002 Reserved for concepts with insufficient information to code with codable children: Secondary | ICD-10-CM

## 2013-11-25 DIAGNOSIS — G4700 Insomnia, unspecified: Secondary | ICD-10-CM | POA: Insufficient documentation

## 2013-11-25 DIAGNOSIS — Z800 Family history of malignant neoplasm of digestive organs: Secondary | ICD-10-CM | POA: Insufficient documentation

## 2013-11-25 DIAGNOSIS — Z860100 Personal history of colon polyps, unspecified: Secondary | ICD-10-CM | POA: Insufficient documentation

## 2013-11-25 DIAGNOSIS — Z09 Encounter for follow-up examination after completed treatment for conditions other than malignant neoplasm: Secondary | ICD-10-CM | POA: Insufficient documentation

## 2013-11-25 DIAGNOSIS — Z8041 Family history of malignant neoplasm of ovary: Secondary | ICD-10-CM | POA: Insufficient documentation

## 2013-11-25 DIAGNOSIS — N898 Other specified noninflammatory disorders of vagina: Secondary | ICD-10-CM | POA: Insufficient documentation

## 2013-11-25 DIAGNOSIS — Z90710 Acquired absence of both cervix and uterus: Secondary | ICD-10-CM | POA: Insufficient documentation

## 2013-11-25 NOTE — Patient Instructions (Signed)
Return to clinic in 6 months.

## 2013-11-25 NOTE — Progress Notes (Signed)
Consult Note: Gyn-Onc  Kristie Howell 63 y.o. female  CC:  Chief Complaint  Patient presents with  . Endo ca    HPI: Patient is a very pleasant 63 year old gravida 2 para 2 (30 are ages 54 and 65) was menopause about 2-1/2 years ago. She was on a combination hormone replacement therapy consisting of Vivelle-Dot we daily Prometrium. She states that this past summer she started having some vaginal bleeding and reported this to her previous gynecologist who performed and after sound that was unremarkable. She was reassured and or supplements to 4 months. She had recurrent bleeding in January of this year reported this to your gynecologist has similar performed a repeat ultrasound that was reassuring and was recommended that she change her hormone replacement therapy. She was uncomfortable with this and she went to see Dr. Cherylann Banas. He performed an ultrasound in saline effusion hysterogram they reveal a retroverted sac septated uterus with a right endometrial cavity measuring 4.5 mm in the left endometrial cavity measuring 5 mm. A biopsy was performed it revealed complex atypical hyperplasia and focal endometrioid adenocarcinoma grade 1.   She underwent a total robotic hysterectomy bilateral salpingo-oophorectomy left pelvic lymph node dissection) clinical dissection omental biopsy and peritoneal biopsies on 12/10/2011. Operative findings included a globular uterus which was retroflexed secondary to adhesions in the cul-de-sac. It was a 4 cm complex ovarian mass on the left side with significant amount of surface excrescence. Frozen section was consistent with at least a low malignant potential tumor. There was no tumor within the uterus on frozen section. She had a normal appearing appendix, no peritoneal disease, and no significant adenopathy.   Final pathology within the uterus revealed a grade 1 endometrioid adenocarcinoma superficially invading into the underlying myometrium 0.1 cm out of 2.0 cm.  There is no evidence of any lymphovascular space involvement. There were fibroids. The cervix was negative. Bilateral fallopian tubes were negative. Bilateral tumors were noted on the adnexa. The left ovary had a serous borderline tumor with microinvasion. The right ovary had a serous borderline tumor. Zero out of two left pelvic, 0/5 left para-aortic nodes were negative. There is no tumor in the omentum or any of the peritoneal based biopsies. Her CA 125 was checked immediately postoperatively was elevated at 191.9. Final [place] staging included a stage IA grade 1 endometrioid adenocarcinoma and a stage IC ovarian borderline tumor. There was microinvasion of the left side. The right side had a borderline tumor with no microinvasion.   I last saw her in 05/2013 at which time her exam was negative as was her pap smear. Her CA 125 was normal at 12.4.  Interval History:   She's overall doing quite well. The biggest issue is that she's having difficulty falling asleep at night. She does have a prescription for Ambien she does not like using often. She states that her mind is racing. Tends to happen every night. For example this morning she did not falsely to 3:00. She's not take naps. Her room is cool and daughter. Her husband snoring is occasionally a problem. She does continue to still have issues with vaginal dryness. It has not improved. She stopped the Premarin cream as she did not feel that was helping. She is sexually active. They do have dyspareunia and are using olive oil. She continues to have a few occasional vaginal "twingy"-type pain that is very fleeting. She continues to have her hot flashes at night. She believes that they are slightly better and are decreasing  in frequency. There are no new medical problems her family.  Review of Systems:  Constitutional:  Denies fever. Skin: No rash, sores, jaundice, itching, or dryness.  Cardiovascular: No chest pain, shortness of breath, or edema  Pulmonary:  No cough or wheeze.  Gastro Intestinal:   No nausea, vomiting, constipation, or diarrhea reported. No bright red blood per rectum or change in bowel movement.  Genitourinary: No frequency, urgency, or dysuria.  Denies vaginal bleeding and discharge.  Musculoskeletal: No myalgia, arthralgia, joint swelling or pain.  Neurologic: No weakness, numbness, or change in gait.  Psychology: As above   Current Meds:  Outpatient Encounter Prescriptions as of 11/25/2013  Medication Sig  . acetaminophen (TYLENOL) 500 MG tablet Take 500 mg by mouth as needed.  . cholecalciferol (VITAMIN D) 1000 UNITS tablet Take 1,000 Units by mouth daily.  Marland Kitchen conjugated estrogens (PREMARIN) vaginal cream Place 1 Applicatorful vaginally 3 (three) times a week.  . fish oil-omega-3 fatty acids 1000 MG capsule Take 1 g by mouth daily.   Marland Kitchen LORazepam (ATIVAN) 0.5 MG tablet Take 1 tablet (0.5 mg total) by mouth every 8 (eight) hours as needed for anxiety.  Marland Kitchen zolpidem (AMBIEN CR) 6.25 MG CR tablet Take 1 tablet (6.25 mg total) by mouth at bedtime as needed for sleep.    Allergy:  Allergies  Allergen Reactions  . Sulfa Antibiotics Swelling and Rash    Social Hx:   History   Social History  . Marital Status: Married    Spouse Name: N/A    Number of Children: 2  . Years of Education: N/A   Occupational History  . homemaker    Social History Main Topics  . Smoking status: Never Smoker   . Smokeless tobacco: Never Used  . Alcohol Use: 1.0 oz/week    2 drink(s) per week     Comment: one drink per week.  . Drug Use: No  . Sexual Activity: Yes    Partners: Male    Birth Control/ Protection: Post-menopausal   Other Topics Concern  . Not on file   Social History Narrative   Lives with husband.  Son in [place], daughter in [place], [place]    Past Surgical Hx:  Past Surgical History  Procedure Laterality Date  . Cesarean section  1985 1987    x2  . Lymph node dissection  12/10/2011    Procedure: LYMPH NODE  DISSECTION;  Surgeon: Imagene Gurney A. Alycia Rossetti, MD;  Location: [facility];  Service: Gynecology;  Laterality: N/A;  Possible Lymph Nodes  . Abdominal hysterectomy      Total robotic hysterectomy bilateral salpingo-oophorectomy, left pelvic lymph node dissection and left para-aortic lymph node dissection, omental biopsy, peritoneal biopsy  . Mass excision  06/26/2012    Procedure: MINOR EXCISION OF MASS;  Surgeon: Cammie Sickle., MD;  Location: [facility];  Service: Orthopedics;  Laterality: Left;  mucoid cyst left long finger   . Finger surgery      left middle finger (same as above)    Past Medical Hx:  Past Medical History  Diagnosis Date  . PMB (postmenopausal bleeding)   . Hemorrhoids     internal/external seen on colonoscopy 02/2011  . Diverticulosis 02/2011  . Hyperplastic colon polyp 02/2011    Dr. Watt Climes q52yr (+FHx)  . Insomnia 2012  . Abnormal TSH     borderline; yearly checks  . Anxiety 12-04-11    prone to panic attacks if stressed, heart rate increases  . PONV (postoperative  nausea and vomiting)   . Ovarian tumor of borderline malignancy     Family Hx:  Family History  Problem Relation Age of Onset  . Thyroid disease Mother   . Melanoma Mother   . Diabetes Mother     borderline (in her 17's)  . Cancer Father 28    Colon cancer  . Heart attack Maternal Grandmother   . Ovarian cancer Paternal Grandmother   . Breast cancer Neg Hx     Vitals:  Blood pressure 116/83, pulse 101, temperature 98.1 F (36.7 C), temperature source Oral, resp. rate 18, height 5\' 5"  (1.651 m), weight 129 lb 8 oz (58.741 kg).  Physical Exam: Well-nourished well-developed female in no acute distress.  Neck: Supple, no lymphadenopathy, no thyromegaly.  Lungs: Clear to auscultation bilaterally.  Cardiovascular: Regular rate and rhythm.  Abdomen: Well-healed surgical incisions. Abdomen is soft, nontender, nondistended. There is no palpable masses. There is no hepatosplenomegaly.  There are no incisional hernias.  Groins: No lymphadenopathy.  Extremities: No edema.  Pelvic: Normal external female genitalia. Vagina is atrophic. There are no visible lesions. Bimanual examination reveals no masses or nodularity. Rectal confirms.  Assessment/Plan:  63 year old with this clinical stage IA 1 endometrioid adenocarcinoma who also has a stage IC ovarian borderline tumor with elevated CA 125. There was microinvasion on the left side. We will followup in results of her CA 125 from today.    Kristie Araki A. Alycia Rossetti, MD 11/25/2013, 10:52 AM

## 2013-11-26 LAB — CA 125: CA 125: 11.5 U/mL (ref 0.0–30.2)

## 2013-11-30 ENCOUNTER — Telehealth: Payer: Self-pay | Admitting: *Deleted

## 2013-11-30 NOTE — Telephone Encounter (Signed)
Called pt lmovm CA125 results normal.

## 2014-01-25 ENCOUNTER — Telehealth: Payer: Self-pay | Admitting: Internal Medicine

## 2014-01-25 MED ORDER — ZOLPIDEM TARTRATE 10 MG PO TABS: ORAL_TABLET | ORAL | Status: DC

## 2014-01-25 NOTE — Telephone Encounter (Signed)
Ok to d/c ambien CR and rx ambien 10mg --1/2 - 1 po qHS prn insomnia #30,no refill.

## 2014-01-25 NOTE — Telephone Encounter (Signed)
Called in med and notified pt that med was called in

## 2014-01-25 NOTE — Telephone Encounter (Signed)
Pt states the ambien 6.25mg  CR, is making her not able to sleep and she is having heart palpations. She would like to be switched back to [place] 10mg . Pt is aware you are not here today and it will be done tomorrow. Please call into [facility]

## 2014-05-10 ENCOUNTER — Telehealth: Payer: Self-pay | Admitting: Internal Medicine

## 2014-05-10 NOTE — Telephone Encounter (Signed)
Pt needs a refill on her ambien to [facility]. Pt know it will be tomorrow before this will get done.

## 2014-05-10 NOTE — Telephone Encounter (Signed)
Ok to refill #30, no add'l refills 

## 2014-05-11 MED ORDER — ZOLPIDEM TARTRATE 10 MG PO TABS: ORAL_TABLET | ORAL | Status: DC

## 2014-05-11 NOTE — Telephone Encounter (Signed)
Phoned in.

## 2014-05-16 ENCOUNTER — Encounter: Payer: Self-pay | Admitting: Gynecologic Oncology

## 2014-05-16 ENCOUNTER — Other Ambulatory Visit (INDEPENDENT_AMBULATORY_CARE_PROVIDER_SITE_OTHER): Payer: PRIVATE HEALTH INSURANCE

## 2014-05-16 DIAGNOSIS — Z23 Encounter for immunization: Secondary | ICD-10-CM

## 2014-06-02 ENCOUNTER — Other Ambulatory Visit (HOSPITAL_COMMUNITY)
Admission: RE | Admit: 2014-06-02 | Discharge: 2014-06-02 | Disposition: A | Discharge status: Home / Self Care | Payer: PRIVATE HEALTH INSURANCE | Source: Ambulatory Visit | Attending: Gynecologic Oncology | Admitting: Gynecologic Oncology

## 2014-06-02 ENCOUNTER — Encounter: Payer: Self-pay | Admitting: Gynecologic Oncology

## 2014-06-02 ENCOUNTER — Other Ambulatory Visit: Payer: PRIVATE HEALTH INSURANCE

## 2014-06-02 ENCOUNTER — Ambulatory Visit: Payer: PRIVATE HEALTH INSURANCE | Attending: Gynecologic Oncology | Admitting: Gynecologic Oncology

## 2014-06-02 VITALS — BP 139/78 | HR 112 | Temp 98.2°F | Resp 16 | Ht 65.0 in | Wt 129.6 lb

## 2014-06-02 DIAGNOSIS — Z90722 Acquired absence of ovaries, bilateral: Secondary | ICD-10-CM | POA: Diagnosis not present

## 2014-06-02 DIAGNOSIS — Z01419 Encounter for gynecological examination (general) (routine) without abnormal findings: Secondary | ICD-10-CM | POA: Insufficient documentation

## 2014-06-02 DIAGNOSIS — Z79899 Other long term (current) drug therapy: Secondary | ICD-10-CM | POA: Diagnosis not present

## 2014-06-02 DIAGNOSIS — Z79890 Hormone replacement therapy (postmenopausal): Secondary | ICD-10-CM | POA: Insufficient documentation

## 2014-06-02 DIAGNOSIS — D3910 Neoplasm of uncertain behavior of unspecified ovary: Secondary | ICD-10-CM

## 2014-06-02 DIAGNOSIS — C541 Malignant neoplasm of endometrium: Secondary | ICD-10-CM

## 2014-06-02 DIAGNOSIS — Z90710 Acquired absence of both cervix and uterus: Secondary | ICD-10-CM | POA: Diagnosis not present

## 2014-06-02 DIAGNOSIS — Z9079 Acquired absence of other genital organ(s): Secondary | ICD-10-CM | POA: Diagnosis not present

## 2014-06-02 DIAGNOSIS — R971 Elevated cancer antigen 125 [CA 125]: Secondary | ICD-10-CM | POA: Diagnosis not present

## 2014-06-02 DIAGNOSIS — C569 Malignant neoplasm of unspecified ovary: Secondary | ICD-10-CM | POA: Insufficient documentation

## 2014-06-02 DIAGNOSIS — IMO0002 Reserved for concepts with insufficient information to code with codable children: Secondary | ICD-10-CM

## 2014-06-02 DIAGNOSIS — N941 Dyspareunia: Secondary | ICD-10-CM | POA: Diagnosis not present

## 2014-06-02 DIAGNOSIS — D495 Neoplasm of unspecified behavior of other genitourinary organs: Secondary | ICD-10-CM

## 2014-06-02 MED ORDER — ESTROGENS, CONJUGATED 0.625 MG/GM VA CREA: 1.0000 | TOPICAL_CREAM | Freq: Every day | VAGINAL | Status: DC

## 2014-06-02 NOTE — Patient Instructions (Addendum)
Increase frequency of Premarin cream - if ineffective after several months please give [place] a call. We will notify you of your results from today. Please return to see [place] in 6 months. Happy holidays!

## 2014-06-02 NOTE — Progress Notes (Signed)
Consult Note: Gyn-Onc  Kristie Howell 63 y.o. female  CC:  Chief Complaint  Patient presents with  . endometrioid adenocarcinoma    HPI: Patient is a very pleasant 63 year old gravida 2 para 2 (59 are ages 77 and 68) was menopause about 2-1/2 years ago. She was on a combination hormone replacement therapy consisting of Vivelle-Dot we daily Prometrium. She states that this past summer she started having some vaginal bleeding and reported this to her previous gynecologist who performed and after sound that was unremarkable. She was reassured and or supplements to 4 months. She had recurrent bleeding in January of this year reported this to your gynecologist has similar performed a repeat ultrasound that was reassuring and was recommended that she change her hormone replacement therapy. She was uncomfortable with this and she went to see Dr. Cherylann Banas. He performed an ultrasound in saline effusion hysterogram they reveal a retroverted sac septated uterus with a right endometrial cavity measuring 4.5 mm in the left endometrial cavity measuring 5 mm. A biopsy was performed it revealed complex atypical hyperplasia and focal endometrioid adenocarcinoma grade 1.   She underwent a total robotic hysterectomy bilateral salpingo-oophorectomy left pelvic lymph node dissection) clinical dissection omental biopsy and peritoneal biopsies on 12/10/2011. Operative findings included a globular uterus which was retroflexed secondary to adhesions in the cul-de-sac. It was a 4 cm complex ovarian mass on the left side with significant amount of surface excrescence. Frozen section was consistent with at least a low malignant potential tumor. There was no tumor within the uterus on frozen section. She had a normal appearing appendix, no peritoneal disease, and no significant adenopathy.   Final pathology within the uterus revealed a grade 1 endometrioid adenocarcinoma superficially invading into the underlying myometrium 0.1 cm  out of 2.0 cm. There is no evidence of any lymphovascular space involvement. There were fibroids. The cervix was negative. Bilateral fallopian tubes were negative. Bilateral tumors were noted on the adnexa. The left ovary had a serous borderline tumor with microinvasion. The right ovary had a serous borderline tumor. Zero out of two left pelvic, 0/5 left para-aortic nodes were negative. There is no tumor in the omentum or any of the peritoneal based biopsies. Her CA 125 was checked immediately postoperatively was elevated at 191.9. Final [place] staging included a stage IA grade 1 endometrioid adenocarcinoma and a stage IC ovarian borderline tumor. There was microinvasion of the left side. The right side had a borderline tumor with no microinvasion.   I last saw her in 11/2013 at which time her exam was negative. Her pap smear was negative in 11/14. Her CA 125 was normal at 11.5 in 5/15.  Interval History:   She is overall doing very well. She continues to occasionally have a twinge of abdominal pain that's fleeting it is very infrequent. She is exercising 3 days a week and walking for about 45 minutes to an hour. Her review of systems as below was completely negative. Her weight is been stable. Her mother passed away a few months ago at the age of 33. She has a new granddaughter [name] who is 51 weeks old. There are no new medical problems in the family. Her mammogram is now due.  She is stating that she is continuing to have some dyspareunia and vaginal dryness. We discussed other options at this point we will increase her Premarin use cream to daily use. She'll touch with a few months. If that does not work he will consider other  options.  Review of Systems:  Constitutional:  Denies fever. Skin: No rash Cardiovascular: No chest pain, shortness of breath, or edema  Pulmonary: No cough Gastro Intestinal:   No nausea, vomiting, constipation, or diarrhea reported. No bright red blood per rectum or  change in bowel movement.  Genitourinary: No frequency, urgency, or dysuria.  Denies vaginal bleeding and discharge.  Musculoskeletal: No myalgia Neurologic: No weakness Psychology: As above   Current Meds:  Outpatient Encounter Prescriptions as of 06/02/2014  Medication Sig  . acetaminophen (TYLENOL) 500 MG tablet Take 500 mg by mouth as needed.  . conjugated estrogens (PREMARIN) vaginal cream Place 1 Applicatorful vaginally 3 (three) times a week.  . zolpidem (AMBIEN) 10 MG tablet Take 1/2-1 tablet by mouth every night as needed for sleep  . cholecalciferol (VITAMIN D) 1000 UNITS tablet Take 1,000 Units by mouth daily.  . [DISCONTINUED] fish oil-omega-3 fatty acids 1000 MG capsule Take 1 g by mouth daily.   . [DISCONTINUED] LORazepam (ATIVAN) 0.5 MG tablet Take 1 tablet (0.5 mg total) by mouth every 8 (eight) hours as needed for anxiety.    Allergy:  Allergies  Allergen Reactions  . Sulfa Antibiotics Swelling and Rash    Social Hx:   History   Social History  . Marital Status: Married    Spouse Name: N/A    Number of Children: 2  . Years of Education: N/A   Occupational History  . homemaker    Social History Main Topics  . Smoking status: Never Smoker   . Smokeless tobacco: Never Used  . Alcohol Use: 1.0 oz/week    2 drink(s) per week     Comment: one drink per week.  . Drug Use: No  . Sexual Activity:    Partners: Male    Birth Control/ Protection: Post-menopausal   Other Topics Concern  . Not on file   Social History Narrative   Lives with husband.  Son in [place], daughter in [place], [place]    Past Surgical Hx:  Past Surgical History  Procedure Laterality Date  . Cesarean section  1985 1987    x2  . Lymph node dissection  12/10/2011    Procedure: LYMPH NODE DISSECTION;  Surgeon: Imagene Gurney A. Alycia Rossetti, MD;  Location: [facility];  Service: Gynecology;  Laterality: N/A;  Possible Lymph Nodes  . Abdominal hysterectomy      Total robotic hysterectomy bilateral  salpingo-oophorectomy, left pelvic lymph node dissection and left para-aortic lymph node dissection, omental biopsy, peritoneal biopsy  . Mass excision  06/26/2012    Procedure: MINOR EXCISION OF MASS;  Surgeon: Cammie Sickle., MD;  Location: [facility];  Service: Orthopedics;  Laterality: Left;  mucoid cyst left long finger   . Finger surgery      left middle finger (same as above)    Past Medical Hx:  Past Medical History  Diagnosis Date  . PMB (postmenopausal bleeding)   . Hemorrhoids     internal/external seen on colonoscopy 02/2011  . Diverticulosis 02/2011  . Hyperplastic colon polyp 02/2011    Dr. Watt Climes q69yr (+FHx)  . Insomnia 2012  . Abnormal TSH     borderline; yearly checks  . Anxiety 12-04-11    prone to panic attacks if stressed, heart rate increases  . PONV (postoperative nausea and vomiting)   . Ovarian tumor of borderline malignancy     Family Hx:  Family History  Problem Relation Age of Onset  . Thyroid disease Mother   .  Melanoma Mother   . Diabetes Mother     borderline (in her 42's)  . Cancer Father 81    Colon cancer  . Heart attack Maternal Grandmother   . Ovarian cancer Paternal Grandmother   . Breast cancer Neg Hx     Vitals:  Blood pressure 139/78, pulse 112, temperature 98.2 F (36.8 C), temperature source Oral, resp. rate 16, height 5\' 5"  (1.651 m), weight 129 lb 9.6 oz (58.786 kg).  Physical Exam: Well-nourished well-developed female in no acute distress.  Neck: Supple, no lymphadenopathy, no thyromegaly.  Lungs: Clear to auscultation bilaterally.  Cardiovascular: Regular rate and rhythm.  Abdomen: Well-healed surgical incisions. Abdomen is soft, nontender, nondistended. There is no palpable masses. There is no hepatosplenomegaly. There are no incisional hernias.  Groins: No lymphadenopathy.  Extremities: No edema.  Pelvic: Normal external female genitalia. Vagina is atrophic. There are no visible lesions. Pap smear  submitted without difficulty. Bimanual examination reveals no masses or nodularity. Rectal confirms.  Assessment/Plan:  63 year old with this clinical stage IA 1 endometrioid adenocarcinoma who also has a stage IC ovarian borderline tumor with elevated CA 125. There was microinvasion on the left side. We will followup in results of her CA 125 and pap smear from today.  She will return in 6 months.  Mikah Poss A., MD 06/02/2014, 9:22 AM

## 2014-06-03 ENCOUNTER — Telehealth: Payer: Self-pay | Admitting: Gynecologic Oncology

## 2014-06-03 LAB — CA 125(PREVIOUS METHOD): CA 125: 13.1 U/mL (ref 0.0–30.2)

## 2014-06-03 LAB — CA 125: CA 125: 16 U/mL (ref <35)

## 2014-06-03 NOTE — Telephone Encounter (Signed)
Pt notified about CA 125 results: 16.  No questions or concerns voiced.

## 2014-06-07 ENCOUNTER — Telehealth: Payer: Self-pay | Admitting: Gynecologic Oncology

## 2014-06-07 LAB — CYTOLOGY - PAP

## 2014-06-07 NOTE — Telephone Encounter (Signed)
Message left for patient with pap smear results: negative.  Instructed to call for any questions or concerns.  

## 2014-07-28 ENCOUNTER — Other Ambulatory Visit: Payer: Self-pay | Admitting: Hematology & Oncology

## 2014-09-01 ENCOUNTER — Telehealth: Payer: Self-pay | Admitting: Family Medicine

## 2014-09-01 MED ORDER — ZOLPIDEM TARTRATE 10 MG PO TABS: ORAL_TABLET | ORAL | Status: DC

## 2014-09-01 NOTE — Telephone Encounter (Signed)
Requesting refill on Ambien 10MG 

## 2014-09-01 NOTE — Telephone Encounter (Signed)
Done. Will call to schedule CPE.

## 2014-09-01 NOTE — Telephone Encounter (Signed)
Due for CPE, none scheduled.  Okay to refill, but schedule

## 2014-10-10 ENCOUNTER — Ambulatory Visit (INDEPENDENT_AMBULATORY_CARE_PROVIDER_SITE_OTHER): Payer: 59 | Admitting: Family Medicine

## 2014-10-10 ENCOUNTER — Encounter: Payer: Self-pay | Admitting: Family Medicine

## 2014-10-10 VITALS — BP 122/80 | HR 80 | Ht 66.0 in | Wt 131.4 lb

## 2014-10-10 DIAGNOSIS — Z0000 Encounter for general adult medical examination without abnormal findings: Secondary | ICD-10-CM

## 2014-10-10 DIAGNOSIS — E785 Hyperlipidemia, unspecified: Secondary | ICD-10-CM

## 2014-10-10 DIAGNOSIS — G4700 Insomnia, unspecified: Secondary | ICD-10-CM

## 2014-10-10 DIAGNOSIS — F419 Anxiety disorder, unspecified: Secondary | ICD-10-CM

## 2014-10-10 LAB — POCT URINALYSIS DIPSTICK
Bilirubin, UA: NEGATIVE
Blood, UA: NEGATIVE
Glucose, UA: NEGATIVE
Ketones, UA: NEGATIVE
Leukocytes, UA: NEGATIVE
Nitrite, UA: NEGATIVE
PROTEIN UA: NEGATIVE
Spec Grav, UA: 1.015
Urobilinogen, UA: NEGATIVE
pH, UA: 6.5

## 2014-10-10 MED ORDER — ZOLPIDEM TARTRATE 5 MG PO TABS: ORAL_TABLET | ORAL | Status: DC

## 2014-10-10 NOTE — Progress Notes (Signed)
Chief Complaint  Patient presents with  . physical    not fasting today. no pap/breast. concern today is having trouble sleeping, eyes checked back in october   Kristie Howell is a 64 y.o. female who presents for a complete physical.  She has the following concerns:  Insomnia:  She has been under more stress recently, and requiring 1/2 tablet of ambien now up to 3 times per week.  She is having trouble cutting them since the pill shape changed.  It is effective, and no side effects. Last filled #30 on 2/18, still has some, but would like to change to 5mg  tablet.  Her husband had papillary thyroid cancer 7 years ago,which had gone into a lymph node.  He had a recurrent lump in his neck, and recently found that it was recurrent thyroid cancer.  He has seen specialist at [facility], has add'l tests scheduled for later this week, and had negative PET scan. Surgery is in the future.  There have been a lot of doctor's appointments, tests, etc which have added to her stress levels.  She is feeling more anxious, mind sometimes racing, along with some palpitations when going to sleep at night.  No panic attacks during the day (she is prone to these).    Immunization History  Administered Date(s) Administered  . Influenza Split 05/05/2013  . Influenza,inj,Quad PF,36+ Mos 05/16/2014  . Tdap 03/23/2007  . Zoster 03/28/2013   Last Pap smear: yearly, 05/2014, but sees Dr. Alycia Rossetti every 6 months Last mammogram: 05/2013 Last colonoscopy: 01/2011, Dr. Watt Climes (due again 2016-17 due to FHx) Last DEXA: never Ophtho: yearly (04/2014) Dentist: every 6 months Exercise:Usual routine is walking daily, trying to walk for about an hour. No weight-bearing activity other than lifting a 67 month old grandchild. Age of menopause was 21; no family h/o osteoporosis  Past Medical History  Diagnosis Date  . PMB (postmenopausal bleeding)   . Hemorrhoids     internal/external seen on colonoscopy 02/2011  . Diverticulosis 02/2011  .  Hyperplastic colon polyp 02/2011    Dr. Watt Climes q62yr (+FHx)  . Insomnia 2012  . Abnormal TSH     borderline; yearly checks  . Anxiety 12-04-11    prone to panic attacks if stressed, heart rate increases  . PONV (postoperative nausea and vomiting)   . Ovarian tumor of borderline malignancy     Past Surgical History  Procedure Laterality Date  . Cesarean section  1985 1987    x2  . Lymph node dissection  12/10/2011    Procedure: LYMPH NODE DISSECTION;  Surgeon: Imagene Gurney A. Alycia Rossetti, MD;  Location: [facility];  Service: Gynecology;  Laterality: N/A;  Possible Lymph Nodes  . Abdominal hysterectomy      Total robotic hysterectomy bilateral salpingo-oophorectomy, left pelvic lymph node dissection and left para-aortic lymph node dissection, omental biopsy, peritoneal biopsy  . Mass excision  06/26/2012    Procedure: MINOR EXCISION OF MASS;  Surgeon: Cammie Sickle., MD;  Location: [place];  Service: Orthopedics;  Laterality: Left;  mucoid cyst left long finger   . Finger surgery      left middle finger (same as above)    History   Social History  . Marital Status: Married    Spouse Name: N/A  . Number of Children: 2  . Years of Education: N/A   Occupational History  . homemaker    Social History Main Topics  . Smoking status: Never Smoker   . Smokeless tobacco:  Never Used  . Alcohol Use: 1.0 oz/week    2 Standard drinks or equivalent per week     Comment: one drink per week.  . Drug Use: No  . Sexual Activity:    Partners: Male    Birth Control/ Protection: Post-menopausal   Other Topics Concern  . Not on file   Social History Narrative   Lives with husband.  Son in [place], daughter in [place], [place]    Family History  Problem Relation Age of Onset  . Thyroid disease Mother   . Melanoma Mother   . Diabetes Mother     borderline (in her 110's)  . Stroke Mother 79  . Cancer Father 8    Colon cancer  . Heart attack Maternal Grandmother   . Ovarian cancer  Paternal Grandmother   . Breast cancer Neg Hx     Outpatient Encounter Prescriptions as of 10/10/2014  Medication Sig Note  . cholecalciferol (VITAMIN D) 1000 UNITS tablet Take 1,000 Units by mouth daily. 08/26/2013: Taking daily for about a year  . zolpidem (AMBIEN) 10 MG tablet Take 1/2-1 tablet by mouth every night as needed for sleep 10/10/2014: Lately using more, due to stress. Using 3x/week (at just 1/2 tablet)  . conjugated estrogens (PREMARIN) vaginal cream Place 1 Applicatorful vaginally daily. (Patient not taking: Reported on 10/10/2014)   . [DISCONTINUED] acetaminophen (TYLENOL) 500 MG tablet Take 500 mg by mouth as needed.     Allergies  Allergen Reactions  . Sulfa Antibiotics Swelling and Rash    ROS: The patient denies anorexia, fever, weight changes, headaches, vision changes, decreased hearing, ear pain, sore throat, breast concerns, chest pain, palpitations, dizziness, syncope, dyspnea on exertion, cough, swelling, nausea, vomiting, diarrhea, constipation, abdominal pain, melena, hematochezia, indigestion/heartburn, hematuria, incontinence, dysuria, vaginal bleeding, discharge, odor or itch, genital lesions, joint pains (mild finger arthritis, unchanged); no numbness, tingling, weakness, tremor, suspicious skin lesions, depression, anxiety (intermittent), abnormal bleeding/bruising, or enlarged lymph nodes. Intermittent, mild palpitations (chronic), mostly at night, when anxious. No longer having right-sided low back pain (resolved), just occasionally on the left now.  Occasional slight tingling in her lips, comes and goes (thinks is related to when she is feeling anxious).  PHYSICAL EXAM:  BP 122/80 mmHg  Pulse 80  Ht 5\' 6"  (1.676 m)  Wt 131 lb 6.4 oz (59.603 kg)  BMI 21.22 kg/m2  General Appearance:   Alert, cooperative, no distress, appears stated age  Head:   Normocephalic, without obvious abnormality, atraumatic  Eyes:   PERRL, conjunctiva/corneas clear,  EOM's intact, fundi   benign  Ears:   Normal TM's and external ear canals  Nose:  Nares normal, mucosa normal, no drainage or sinus tenderness  Throat:  Lips, mucosa, and tongue normal; teeth and gums normal  Neck:  Supple, no lymphadenopathy; thyroid: no enlargement/tenderness/nodules; no carotid  bruit or JVD  Back:  Spine nontender, no curvature, ROM normal, no CVAtenderness.  Lungs:   Clear to auscultation bilaterally without wheezes, rales or ronchi; respirations unlabored  Chest Wall:   No tenderness or deformity  Heart:   Regular rate and rhythm, S1 and S2 normal, no murmur, rub  or gallop  Breast Exam:   Deferred to GYN  Abdomen:   Soft, non-tender, nondistended, normoactive bowel sounds,   no masses, no hepatosplenomegaly  Genitalia:   Deferred to GYN     Extremities:  No clubbing, cyanosis or edema  Pulses:  2+ and symmetric all extremities  Skin:  Skin color, texture, turgor  normal, no rashes or lesions  Lymph nodes:  Cervical, supraclavicular, and axillary nodes normal  Neurologic:  CNII-XII intact, normal strength, sensation and gait; reflexes 2+ and symmetric throughout  Psych: Normal mood, affect, hygiene and grooming        ASSESSMENT/PLAN:  Routine general medical examination at a health care facility - Plan: POCT urinalysis dipstick, TSH, Glucose, random, Lipid panel  Insomnia - related to insomnia; infrequent, although more recently.  continue Ambien--change to 5mg  to avoid cutting. - Plan: zolpidem (AMBIEN) 5 MG tablet  Anxiety - increased related to husband's recurrent cancer diagnosis. reviewed behavioral techniques. discussed meds--declines for now.  Hyperlipidemia - Plan: Lipid panel   We reviewed preventative treatment vs episodic treatment for anxiety, vs continuing her current treatment, which is just using ambien for intermittent insomnia.  Continue  current plan.  Return to discuss in more detail if anxiety is worsening.  Discussed monthly self breast exams and yearly mammograms; at least 30 minutes of aerobic activity at least 5 days/week; proper sunscreen use reviewed; healthy diet, including goals of calcium and vitamin D intake and alcohol recommendations (less than or equal to 1 drink/day) reviewed; regular seatbelt use; changing batteries in smoke detectors. Immunization recommendations discussed--pneumonia vaccine (prevnar-13) at age 55; yearly flu shots. Others are UTD. Colonoscopy recommendations reviewed--due in July. DEXA age 70 (currently only 4 years postmenopausal).   Lipid, TSH,  Glu  Husband to be seen for possible flu today.  If + will contact us for preventive tamiflu

## 2014-10-10 NOTE — Patient Instructions (Addendum)
  HEALTH MAINTENANCE RECOMMENDATIONS:  It is recommended that you get at least 30 minutes of aerobic exercise at least 5 days/week (for weight loss, you may need as much as 60-90 minutes). This can be any activity that gets your heart rate up. This can be divided in 10-15 minute intervals if needed, but try and build up your endurance at least once a week.  Weight bearing exercise is also recommended twice weekly.  Eat a healthy diet with lots of vegetables, fruits and fiber.  "Colorful" foods have a lot of vitamins (ie green vegetables, tomatoes, red peppers, etc).  Limit sweet tea, regular sodas and alcoholic beverages, all of which has a lot of calories and sugar.  Up to 1 alcoholic drink daily may be beneficial for women (unless trying to lose weight, watch sugars).  Drink a lot of water.  Calcium recommendations are 1200-1500 mg daily (1500 mg for postmenopausal women or women without ovaries), and vitamin D 1000 IU daily.  This should be obtained from diet and/or supplements (vitamins), and calcium should not be taken all at once, but in divided doses.  Monthly self breast exams and yearly mammograms for women over the age of 57 is recommended.  Sunscreen of at least SPF 30 should be used on all sun-exposed parts of the skin when outside between the hours of 10 am and 4 pm (not just when at beach or pool, but even with exercise, golf, tennis, and yard work!)  Use a sunscreen that says "broad spectrum" so it covers both UVA and UVB rays, and make sure to reapply every 1-2 hours.  Remember to change the batteries in your smoke detectors when changing your clock times in the spring and fall.  Use your seat belt every time you are in a car, and please drive safely and not be distracted with cell phones and texting while driving.  If your anxiety is worsening--having more symptoms during the day, rather than just interfering with your sleep, please return to further discuss other treatment options.   Continue to use stress reduction techniques such as exercise, to also help.

## 2014-11-30 ENCOUNTER — Ambulatory Visit: Payer: PRIVATE HEALTH INSURANCE | Admitting: Gynecologic Oncology

## 2014-12-01 ENCOUNTER — Ambulatory Visit: Payer: PRIVATE HEALTH INSURANCE | Admitting: Gynecologic Oncology

## 2014-12-13 ENCOUNTER — Encounter: Payer: Self-pay | Admitting: Gynecologic Oncology

## 2014-12-13 ENCOUNTER — Ambulatory Visit: Payer: 59 | Attending: Gynecologic Oncology | Admitting: Gynecologic Oncology

## 2014-12-13 ENCOUNTER — Other Ambulatory Visit (HOSPITAL_BASED_OUTPATIENT_CLINIC_OR_DEPARTMENT_OTHER): Payer: 59

## 2014-12-13 VITALS — BP 134/73 | HR 98 | Temp 98.5°F | Resp 16 | Ht 66.0 in | Wt 129.8 lb

## 2014-12-13 DIAGNOSIS — Z8543 Personal history of malignant neoplasm of ovary: Secondary | ICD-10-CM | POA: Diagnosis not present

## 2014-12-13 DIAGNOSIS — Z8542 Personal history of malignant neoplasm of other parts of uterus: Secondary | ICD-10-CM | POA: Diagnosis not present

## 2014-12-13 DIAGNOSIS — IMO0002 Reserved for concepts with insufficient information to code with codable children: Secondary | ICD-10-CM

## 2014-12-13 DIAGNOSIS — Z8742 Personal history of other diseases of the female genital tract: Secondary | ICD-10-CM | POA: Insufficient documentation

## 2014-12-13 DIAGNOSIS — N941 Dyspareunia: Secondary | ICD-10-CM | POA: Diagnosis not present

## 2014-12-13 DIAGNOSIS — Z90710 Acquired absence of both cervix and uterus: Secondary | ICD-10-CM | POA: Insufficient documentation

## 2014-12-13 DIAGNOSIS — Z08 Encounter for follow-up examination after completed treatment for malignant neoplasm: Secondary | ICD-10-CM | POA: Insufficient documentation

## 2014-12-13 DIAGNOSIS — N952 Postmenopausal atrophic vaginitis: Secondary | ICD-10-CM | POA: Insufficient documentation

## 2014-12-13 DIAGNOSIS — Z90722 Acquired absence of ovaries, bilateral: Secondary | ICD-10-CM | POA: Insufficient documentation

## 2014-12-13 DIAGNOSIS — D3910 Neoplasm of uncertain behavior of unspecified ovary: Secondary | ICD-10-CM

## 2014-12-13 DIAGNOSIS — D495 Neoplasm of unspecified behavior of other genitourinary organs: Secondary | ICD-10-CM

## 2014-12-13 MED ORDER — OSPEMIFENE 60 MG PO TABS: 60.0000 mg | ORAL_TABLET | Freq: Every day | ORAL | Status: DC

## 2014-12-13 NOTE — Progress Notes (Signed)
Consult Note: Gyn-Onc  Kristie Howell 64 y.o. female  CC:  Chief Complaint  Patient presents with  . endometrioid adenocarcinoma    HPI: Patient is a very pleasant 64 year old gravida 2 para 2 (22 are ages 78 and 66) was menopause about 2-1/2 years ago. She was on a combination hormone replacement therapy consisting of Vivelle-Dot we daily Prometrium. She states that this past summer she started having some vaginal bleeding and reported this to her previous gynecologist who performed and after sound that was unremarkable. She was reassured and or supplements to 4 months. She had recurrent bleeding in January of this year reported this to your gynecologist has similar performed a repeat ultrasound that was reassuring and was recommended that she change her hormone replacement therapy. She was uncomfortable with this and she went to see Dr. Cherylann Banas. He performed an ultrasound in saline effusion hysterogram they reveal a retroverted sac septated uterus with a right endometrial cavity measuring 4.5 mm in the left endometrial cavity measuring 5 mm. A biopsy was performed it revealed complex atypical hyperplasia and focal endometrioid adenocarcinoma grade 1.   She underwent a total robotic hysterectomy bilateral salpingo-oophorectomy left pelvic lymph node dissection) clinical dissection omental biopsy and peritoneal biopsies on 12/10/2011. Operative findings included a globular uterus which was retroflexed secondary to adhesions in the cul-de-sac. It was a 4 cm complex ovarian mass on the left side with significant amount of surface excrescence. Frozen section was consistent with at least a low malignant potential tumor. There was no tumor within the uterus on frozen section. She had a normal appearing appendix, no peritoneal disease, and no significant adenopathy.   Final pathology within the uterus revealed a grade 1 endometrioid adenocarcinoma superficially invading into the underlying myometrium 0.1 cm  out of 2.0 cm. There is no evidence of any lymphovascular space involvement. There were fibroids. The cervix was negative. Bilateral fallopian tubes were negative. Bilateral tumors were noted on the adnexa. The left ovary had a serous borderline tumor with microinvasion. The right ovary had a serous borderline tumor. Zero out of two left pelvic, 0/5 left para-aortic nodes were negative. There is no tumor in the omentum or any of the peritoneal based biopsies. Her CA 125 was checked immediately postoperatively was elevated at 191.9. Final [place] staging included a stage IA grade 1 endometrioid adenocarcinoma and a stage IC ovarian borderline tumor. There was microinvasion of the left side. The right side had a borderline tumor with no microinvasion.   I last saw her in 05/2014 at which time her exam was negative as was her pap smear and her CA-125 (16).  Interval History:   She is overall doing very well. She has a granddaughter Kristie Howell is 19 months old.  She is stating that she is continuing to have some dyspareunia and vaginal dryness. We discussed other options at this point she was to try osphena. She is really not sure she was take a medication every day as a have intercourse fairly regularly. Her husband had a recurrence of his thyroid cancer 7 years later. He will need to get radioactive iodine. Her dyspareunia appears to be both at the level of the introitus which she relates to vaginal dryness as well as deep dyspareunia. They have not really tried different positions. There is no bleeding.  Review of Systems:  Constitutional:  Denies fever. Skin: No rash Cardiovascular: No chest pain, shortness of breath, or edema  Pulmonary: No cough Gastro Intestinal:   No nausea,  vomiting, constipation, or diarrhea reported. No bright red blood per rectum or change in bowel movement. No early satiety, no bloating Genitourinary: No frequency, urgency, or dysuria.  Denies vaginal bleeding and discharge.  Positive dyspareunia Musculoskeletal: No myalgia Neurologic: No weakness Psychology: As above   Current Meds:  Outpatient Encounter Prescriptions as of 12/13/2014  Medication Sig  . cholecalciferol (VITAMIN D) 1000 UNITS tablet Take 1,000 Units by mouth daily.  Marland Kitchen zolpidem (AMBIEN) 5 MG tablet Take 1 tablet by mouth as needed for sleep  . conjugated estrogens (PREMARIN) vaginal cream Place 1 Applicatorful vaginally daily. (Patient not taking: Reported on 12/13/2014)   No facility-administered encounter medications on file as of 12/13/2014.    Allergy:  Allergies  Allergen Reactions  . Sulfa Antibiotics Swelling and Rash    Social Hx:   History   Social History  . Marital Status: Married    Spouse Name: N/A  . Number of Children: 2  . Years of Education: N/A   Occupational History  . homemaker    Social History Main Topics  . Smoking status: Never Smoker   . Smokeless tobacco: Never Used  . Alcohol Use: 1.0 oz/week    2 Standard drinks or equivalent per week     Comment: one drink per week.  . Drug Use: No  . Sexual Activity:    Partners: Male    Birth Control/ Protection: Post-menopausal   Other Topics Concern  . Not on file   Social History Narrative   Lives with husband.  Son in [place], daughter in [place], [place]    Past Surgical Hx:  Past Surgical History  Procedure Laterality Date  . Cesarean section  1985 1987    x2  . Lymph node dissection  12/10/2011    Procedure: LYMPH NODE DISSECTION;  Surgeon: Imagene Gurney A. Alycia Rossetti, MD;  Location: [facility];  Service: Gynecology;  Laterality: N/A;  Possible Lymph Nodes  . Abdominal hysterectomy      Total robotic hysterectomy bilateral salpingo-oophorectomy, left pelvic lymph node dissection and left para-aortic lymph node dissection, omental biopsy, peritoneal biopsy  . Mass excision  06/26/2012    Procedure: MINOR EXCISION OF MASS;  Surgeon: Cammie Sickle., MD;  Location: [place];  Service: Orthopedics;   Laterality: Left;  mucoid cyst left long finger   . Finger surgery      left middle finger (same as above)    Past Medical Hx:  Past Medical History  Diagnosis Date  . PMB (postmenopausal bleeding)   . Hemorrhoids     internal/external seen on colonoscopy 02/2011  . Diverticulosis 02/2011  . Hyperplastic colon polyp 02/2011    Dr. Watt Climes q7yr (+FHx)  . Insomnia 2012  . Abnormal TSH     borderline; yearly checks  . Anxiety 12-04-11    prone to panic attacks if stressed, heart rate increases  . PONV (postoperative nausea and vomiting)   . Ovarian tumor of borderline malignancy     Family Hx:  Family History  Problem Relation Age of Onset  . Thyroid disease Mother   . Melanoma Mother   . Diabetes Mother     borderline (in her 21's)  . Stroke Mother 48  . Cancer Father 77    Colon cancer  . Heart attack Maternal Grandmother   . Ovarian cancer Paternal Grandmother   . Breast cancer Neg Hx     Vitals:  Blood pressure 134/73, pulse 98, temperature 98.5 F (36.9 C), temperature source Oral,  resp. rate 16, height 5\' 6"  (1.676 m), weight 129 lb 12.8 oz (58.877 kg).  Physical Exam: Well-nourished well-developed female in no acute distress.  Neck: Supple, no lymphadenopathy, no thyromegaly.  Lungs: Clear to auscultation bilaterally.  Cardiovascular: Regular rate and rhythm.  Abdomen: Well-healed surgical incisions. Abdomen is soft, nontender, nondistended. There is no palpable masses. There is no hepatosplenomegaly. There are no incisional hernias.  Groins: No lymphadenopathy.  Extremities: No edema.  Pelvic: Normal external female genitalia. Vagina is atrophic. There are no visible lesions.Bimanual examination reveals no masses or nodularity. Rectal confirms. She does have some discomfort that is reproducible at the top of the vaginal cuff with palpation of the vaginal cuff. During the bimanual examination we'll tentatively bimanual with 2 fingers which she did not tolerate  secondary to discomfort then on unit digital pelvic examination she does appear to have a narrow area of narrowing in the mid vagina that was tender.  Assessment/Plan:  64 year old with this clinical stage IA 1 endometrioid adenocarcinoma who also has a stage IC ovarian borderline tumor with elevated CA 125. There was microinvasion on the left side. We will followup in results of her CA 125  from today.  She will return in 6 months.  With regards to her dyspareunia, she was given a prescription osphena. She is not sure if she will fill this prescription. We also discussed physical therapy, sexual therapist as well as the use of dilators and other opportunities to help with her dyspareunia. She at this point does not want to commit to any therapy she states that she will think about it. I do believe that she has a transverse vaginal tightness that is part of the problem as her pain was reproducible today. We could also have her seen by the pelvic pain specialist. At this time she was to think about her options and will give [place] a call if she wishes to proceed with any other intervention.  Brolin Dambrosia A., MD 12/13/2014, 3:18 PM

## 2014-12-13 NOTE — Patient Instructions (Addendum)
We will notify you of the results of your bloodwork from today. Please return to see Dr. Alycia Rossetti in 6 months.   Ospemifene oral tablets What is this medicine? OSPEMIFENE (os PEM i feen) is used to treat painful sexual intercourse in females after menopause, a symptom of menopause that occurs due to changes in and around the vagina. This medicine may be used for other purposes; ask your health care provider or pharmacist if you have questions. COMMON BRAND NAME(S): Osphena What should I tell my health care provider before I take this medicine? They need to know if you have any of these conditions: -cancer, such as breast, uterine, or other cancer -heart disease -history of blood clots -history of stroke -history of vaginal bleeding -liver disease -premenopausal -smoke tobacco -an unusual or allergic reaction to ospemifene, other medicines, foods, dyes, or preservatives -pregnant or trying to get pregnant -breast-feeding How should I use this medicine? Take this medicine by mouth with a glass of water. Take this medicine with food. Follow the directions on the prescription label. Do not take your medicine more often than directed. Talk to your pediatrician regarding the use of this medicine in children. Special care may be needed. Overdosage: If you think you've taken too much of this medicine contact a poison control center or emergency room at once. Overdosage: If you think you have taken too much of this medicine contact a poison control center or emergency room at once. NOTE: This medicine is only for you. Do not share this medicine with others. What if I miss a dose? If you miss a dose, take it as soon as you can. If it is almost time for your next dose, take only that dose. Do not take double or extra doses. What may interact with this medicine? -doxycycline -estrogens -fluconazole -furosemide -glyburide -ketoconazole -phenytoin -rifampin -warfarin This list may not describe  all possible interactions. Give your health care provider a list of all the medicines, herbs, non-prescription drugs, or dietary supplements you use. Also tell them if you smoke, drink alcohol, or use illegal drugs. Some items may interact with your medicine. What should I watch for while using this medicine? Visit your health care professional for regular checks on your progress. You will need a regular breast and pelvic exam and Pap smear while on this medicine. You should also discuss the need for regular mammograms with your health care professional, and follow his or her guidelines for these tests. Also, periodically discuss the need to continue taking this medicine. Taking this medicine for long periods of time may increase your risk for serious side effects. This medicine can increase the risk of developing a condition (endometrial hyperplasia) that may lead to cancer of the lining of the uterus. Taking progestins, another hormone drug, with this medicine lowers the risk of developing this condition. Therefore, if your uterus has not been removed (by a hysterectomy), your doctor may prescribe a progestin for you to take together with your estrogen. You should know, however, that taking estrogens with progestins may have additional health risks. You should discuss the use of estrogens and progestins with your health care professional to determine the benefits and risks for you. This medicine can rarely cause blood clots. You should avoid long periods of bed rest while taking this medicine. If you are going to have surgery, tell your doctor or health care professional that you are taking this medicine. This medicine should be stopped at least 4-6 weeks before surgery. After surgery,  it should be restarted only after you are walking again. It should not be restarted while you still need long periods of bed rest. You should not smoke while taking this medicine. Smoking may also increase your risk of blood  clots. Smoking can also decrease the effects of this medicine. This medicine does not prevent hot flashes. It may cause hot flashes in some patients. If you have any reason to think you are pregnant; stop taking this medicine at once and contact your doctor or health care professional. What side effects may I notice from receiving this medicine? Side effects that you should report to your doctor or health care professional as soon as possible: -breathing problems -changes in vision -confusion, trouble speaking or understanding -new breast lumps -pain, swelling, warmth in the leg -pelvic pain or pressure -severe headaches -sudden chest pain -sudden numbness or weakness of the face, arm or leg -trouble walking, dizziness, loss of balance or coordination -unusual vaginal bleeding patterns -vaginal discharge that is bloody or brown Side effects that usually do not require medical attention (Report these to your doctor or health care professional if they continue or are bothersome.): -hot flushes or flashes -increased sweating -muscle cramps -vaginal discharge (white or clear) This list may not describe all possible side effects. Call your doctor for medical advice about side effects. You may report side effects to FDA at 1-800-FDA-1088. Where should I keep my medicine? Keep out of the reach of children. Store at room temperature between 20 and 25 degrees C (68 and 77 degrees F). Protect from light. Keep container tightly closed. Throw away any unused medicine after the expiration date. NOTE: This sheet is a summary. It may not cover all possible information. If you have questions about this medicine, talk to your doctor, pharmacist, or health care provider.  2015, Elsevier/Gold Standard. (2013-09-14 11:46:51)

## 2014-12-14 ENCOUNTER — Telehealth: Payer: Self-pay | Admitting: *Deleted

## 2014-12-14 LAB — CA 125: CA 125: 18 U/mL (ref <35)

## 2014-12-14 NOTE — Telephone Encounter (Signed)
Per Joylene John, NP patient notified of normal CA125 results. Patient appreciative of call.

## 2015-02-23 ENCOUNTER — Other Ambulatory Visit: Payer: 59

## 2015-02-23 DIAGNOSIS — E785 Hyperlipidemia, unspecified: Secondary | ICD-10-CM

## 2015-02-23 DIAGNOSIS — Z0000 Encounter for general adult medical examination without abnormal findings: Secondary | ICD-10-CM

## 2015-02-23 LAB — LIPID PANEL
CHOLESTEROL: 209 mg/dL — AB (ref 125–200)
HDL: 67 mg/dL (ref >=46)
LDL Cholesterol: 126 mg/dL (ref <130)
TRIGLYCERIDES: 81 mg/dL (ref <150)
Total CHOL/HDL Ratio: 3.1 Ratio (ref <=5.0)
VLDL: 16 mg/dL (ref <30)

## 2015-02-23 LAB — TSH: TSH: 3.889 u[IU]/mL (ref 0.350–4.500)

## 2015-02-23 LAB — GLUCOSE, RANDOM: Glucose, Bld: 83 mg/dL (ref 65–99)

## 2015-05-29 ENCOUNTER — Telehealth: Payer: Self-pay | Admitting: Family Medicine

## 2015-05-29 DIAGNOSIS — G4700 Insomnia, unspecified: Secondary | ICD-10-CM

## 2015-05-29 MED ORDER — ZOLPIDEM TARTRATE 5 MG PO TABS: ORAL_TABLET | ORAL | Status: DC

## 2015-05-29 NOTE — Telephone Encounter (Signed)
Ok for #30 with 1 refill 

## 2015-05-29 NOTE — Telephone Encounter (Signed)
Requesting refill on Ambien 5mg 

## 2015-05-29 NOTE — Telephone Encounter (Signed)
Done

## 2015-06-14 ENCOUNTER — Other Ambulatory Visit: Payer: 59

## 2015-06-14 ENCOUNTER — Encounter: Payer: Self-pay | Admitting: Gynecologic Oncology

## 2015-06-14 ENCOUNTER — Other Ambulatory Visit (HOSPITAL_COMMUNITY)
Admission: RE | Admit: 2015-06-14 | Discharge: 2015-06-14 | Disposition: A | Discharge status: Home / Self Care | Payer: 59 | Source: Ambulatory Visit | Attending: Gynecologic Oncology | Admitting: Gynecologic Oncology

## 2015-06-14 ENCOUNTER — Ambulatory Visit: Payer: 59 | Attending: Gynecologic Oncology | Admitting: Gynecologic Oncology

## 2015-06-14 VITALS — BP 124/72 | HR 97 | Temp 98.2°F | Resp 18 | Ht 66.0 in | Wt 133.6 lb

## 2015-06-14 DIAGNOSIS — N941 Unspecified dyspareunia: Secondary | ICD-10-CM

## 2015-06-14 DIAGNOSIS — C541 Malignant neoplasm of endometrium: Secondary | ICD-10-CM | POA: Diagnosis not present

## 2015-06-14 DIAGNOSIS — D3910 Neoplasm of uncertain behavior of unspecified ovary: Secondary | ICD-10-CM | POA: Insufficient documentation

## 2015-06-14 DIAGNOSIS — Z01411 Encounter for gynecological examination (general) (routine) with abnormal findings: Secondary | ICD-10-CM | POA: Insufficient documentation

## 2015-06-14 MED ORDER — ESTROGENS, CONJUGATED 0.625 MG/GM VA CREA: 1.0000 | TOPICAL_CREAM | Freq: Every day | VAGINAL | Status: DC

## 2015-06-14 NOTE — Patient Instructions (Addendum)
We will notify you of your results from today. Please follow-up with Dr. Alycia Rossetti in 6 months.  I sent a prescription for vaginal estrogen to your drug store. Referral has been placed with a physical therapist.  Physical Therapy Prescription Your health care provider recommends that you have physical therapy. Please take this set of instructions to the physical therapist who will be treating you. __x_ Evaluate and treat using standard techniques. ___ Evaluate and contact health care provider before starting treatment. ___ Heat therapy: ( ) Ultrasound ( ) Hot Packs ( ) Paraffin. ___ Electrotherapy: ( ) TENS ( ) Electrical Stimulation. ___ Hydrotherapy. ___ Cryotherapy. __x_ Therapeutic Exercise: ( ) Back ( ) Neck ( ) Extremity ___pelvic physical therapy____________ (please describe). ( ) Gait ( ) Home program. ___ Massage. ___ Traction: ( ) Pelvic ( ) Cervical. ___ Posture and body mechanics education. ___ Postsurgical rehabilitation following: ________________________________ ___ Other (please describe): _________________________________________________________________________________. Treatment Goal: __Improve pelvic health, decrease dyspareunia, pelvic relaxation techniques______________________________________________________________________________ _____________________________________________________________.  Please send progress note to health care provider at the completion of therapy. Call referring health care provider if patient's condition becomes worse.   This information is not intended to replace advice given to you by your health care provider. Make sure you discuss any questions you have with your health care provider.   Document Released: 08/08/2004 Document Revised: 07/22/2014 Document Reviewed: 01/31/2014 Elsevier Interactive Patient Education [employer].

## 2015-06-14 NOTE — Progress Notes (Signed)
Consult Note: Gyn-Onc  Joany Haslett Hageman 64 y.o. female  CC:  Chief Complaint  Patient presents with  . Follow-up    HPI: Patient is a very pleasant 64 year old gravida 2 para 2 was menopause about 2-1/2 years ago. She was on a combination hormone replacement therapy consisting of Vivelle-Dot we daily Prometrium. She states that this past summer she started having some vaginal bleeding and reported this to her previous gynecologist who performed and after sound that was unremarkable. She was reassured and or supplements to 4 months. She had recurrent bleeding in January of this year reported this to your gynecologist has similar performed a repeat ultrasound that was reassuring and was recommended that she change her hormone replacement therapy. She was uncomfortable with this and she went to see Dr. Cherylann Banas. He performed an ultrasound in saline effusion hysterogram they reveal a retroverted sac septated uterus with a right endometrial cavity measuring 4.5 mm in the left endometrial cavity measuring 5 mm. A biopsy was performed it revealed complex atypical hyperplasia and focal endometrioid adenocarcinoma grade 1.   She underwent a total robotic hysterectomy bilateral salpingo-oophorectomy left pelvic lymph node dissection) clinical dissection omental biopsy and peritoneal biopsies on 12/10/2011. Operative findings included a globular uterus which was retroflexed secondary to adhesions in the cul-de-sac. It was a 4 cm complex ovarian mass on the left side with significant amount of surface excrescence. Frozen section was consistent with at least a low malignant potential tumor. There was no tumor within the uterus on frozen section. She had a normal appearing appendix, no peritoneal disease, and no significant adenopathy.   Final pathology within the uterus revealed a grade 1 endometrioid adenocarcinoma superficially invading into the underlying myometrium 0.1 cm out of 2.0 cm. There is no evidence of  any lymphovascular space involvement. There were fibroids. The cervix was negative. Bilateral fallopian tubes were negative. Bilateral tumors were noted on the adnexa. The left ovary had a serous borderline tumor with microinvasion. The right ovary had a serous borderline tumor. Zero out of two left pelvic, 0/5 left para-aortic nodes were negative. There is no tumor in the omentum or any of the peritoneal based biopsies. Her CA 125 was checked immediately postoperatively was elevated at 191.9. Final [place] staging included a stage IA grade 1 endometrioid adenocarcinoma and a stage IC ovarian borderline tumor. There was microinvasion of the left side. The right side had a borderline tumor with no microinvasion.   I last saw her in 11/2014 at which time her exam was negative. Her CA-125 values have been normal. Her pap smear was negative in 11/15.  Interval History:   She is overall doing very well. She has a granddaughter [name] is about a year old. She never tried the osphena that we prescribed at her last visit she was concerned about the side effect profiles. She continues to struggle with issues of vaginal dryness and dyspareunia. Her husband had thyroid cancer is doing well from that perspective and is on Synthroid. He is also been placed on testosterone and that is causing some stress. She occasionally gets irritated and will have some bleeding with the estrogen cream. She believes this is due to the applicator causing small tears in the vagina. She's due for mammogram in January. There are no new medical problems and her family.  Review of Systems:  Constitutional:  Denies fever. Skin: No rash Cardiovascular: No chest pain, shortness of breath, or edema  Pulmonary: No cough Gastro Intestinal:   No  nausea, vomiting, constipation, or diarrhea reported. No bright red blood per rectum or change in bowel movement. No early satiety, no bloating Genitourinary: No frequency, urgency, or dysuria.   Denies vaginal bleeding and discharge. Positive dyspareunia and vaginal dryness Musculoskeletal: No myalgia Neurologic: No weakness Psychology: No changes  Current Meds:  Outpatient Encounter Prescriptions as of 06/14/2015  Medication Sig  . cholecalciferol (VITAMIN D) 1000 UNITS tablet Take 1,000 Units by mouth daily.  . Omega-3 Fatty Acids (OMEGA-3 FISH OIL PO) Take by mouth.  . zolpidem (AMBIEN) 5 MG tablet Take 1 tablet by mouth as needed for sleep  . conjugated estrogens (PREMARIN) vaginal cream Place 1 Applicatorful vaginally daily. (Patient not taking: Reported on 12/13/2014)  . Ospemifene 60 MG TABS Take 60 mg by mouth daily. (Patient not taking: Reported on 06/14/2015)   No facility-administered encounter medications on file as of 06/14/2015.    Allergy:  Allergies  Allergen Reactions  . Sulfa Antibiotics Swelling and Rash    Social Hx:   Social History   Social History  . Marital Status: Married    Spouse Name: N/A  . Number of Children: 2  . Years of Education: N/A   Occupational History  . homemaker    Social History Main Topics  . Smoking status: Never Smoker   . Smokeless tobacco: Never Used  . Alcohol Use: 1.0 oz/week    2 Standard drinks or equivalent per week     Comment: one drink per week.  . Drug Use: No  . Sexual Activity:    Partners: Male    Birth Control/ Protection: Post-menopausal   Other Topics Concern  . Not on file   Social History Narrative   Lives with husband.  Son in [place], daughter in [place], [place]    Past Surgical Hx:  Past Surgical History  Procedure Laterality Date  . Cesarean section  1985 1987    x2  . Lymph node dissection  12/10/2011    Procedure: LYMPH NODE DISSECTION;  Surgeon: Imagene Gurney A. Alycia Rossetti, MD;  Location: [facility];  Service: Gynecology;  Laterality: N/A;  Possible Lymph Nodes  . Abdominal hysterectomy      Total robotic hysterectomy bilateral salpingo-oophorectomy, left pelvic lymph node dissection and left  para-aortic lymph node dissection, omental biopsy, peritoneal biopsy  . Mass excision  06/26/2012    Procedure: MINOR EXCISION OF MASS;  Surgeon: Cammie Sickle., MD;  Location: [facility];  Service: Orthopedics;  Laterality: Left;  mucoid cyst left long finger   . Finger surgery      left middle finger (same as above)    Past Medical Hx:  Past Medical History  Diagnosis Date  . PMB (postmenopausal bleeding)   . Hemorrhoids     internal/external seen on colonoscopy 02/2011  . Diverticulosis 02/2011  . Hyperplastic colon polyp 02/2011    Dr. Watt Climes q17yr (+FHx)  . Insomnia 2012  . Abnormal TSH     borderline; yearly checks  . Anxiety 12-04-11    prone to panic attacks if stressed, heart rate increases  . PONV (postoperative nausea and vomiting)   . Ovarian tumor of borderline malignancy     Family Hx:  Family History  Problem Relation Age of Onset  . Thyroid disease Mother   . Melanoma Mother   . Diabetes Mother     borderline (in her 87's)  . Stroke Mother 59  . Cancer Father 66    Colon cancer  . Heart attack Maternal  Grandmother   . Ovarian cancer Paternal Grandmother   . Breast cancer Neg Hx     Vitals:  Blood pressure 124/72, pulse 97, temperature 98.2 F (36.8 C), temperature source Oral, resp. rate 18, height 5\' 6"  (1.676 m), weight 133 lb 9.6 oz (60.601 kg), SpO2 100 %.  Physical Exam: Well-nourished well-developed female in no acute distress.  Neck: Supple, no lymphadenopathy, no thyromegaly.  Lungs: Clear to auscultation bilaterally.  Cardiovascular: Regular rate and rhythm.  Abdomen: Well-healed surgical incisions. Abdomen is soft, nontender, nondistended. There is no palpable masses. There is no hepatosplenomegaly. There are no incisional hernias.  Groins: No lymphadenopathy.  Extremities: No edema.  Pelvic: Normal external female genitalia. Vagina is atrophic. There are no visible lesions. Pap smear submitted without difficulty.  Bimanual examination reveals no masses or nodularity. There is either voluntary involuntary tensing of the pelvic floor muscles as well as the abdominal wall with bimanual examination. Patient is cognizant of it. with distraction she's able to relax. Rectal confirms no nodularity. There is no tenting of the pelvic floor muscles with rectal exam.   Assessment/Plan:  64 year old with this clinical stage IA 1 endometrioid adenocarcinoma who also has a stage IC ovarian borderline tumor with elevated CA 125. There was microinvasion on the left side. We will followup in results of her CA 125 and pap smear from today.  She will return in 6 months.  We discussed the dyspareunia as well as her tensing of the vaginal and abdominal wall musculatures. She is cognizant of her tensing her pelvic floor and she does believe that this is contributing to the sexual dysfunction issues that she is suffering. We will start her on vaginal estrogen as that has been helpful for her in the past. She does have dilators at home but has not been using them regularly.. I do believe she would benefit from pelvic physical therapy. We discussed this and she was given a brochure. While she is somewhat hesitant she states that if we make a referral she will attend the visit. She understands that it's important to try to manage this issue at this time before it becomes more significant. She will, she has any questions or concerns after she sees a physical therapist.   Sherol Dade., MD 06/14/2015, 9:57 AM

## 2015-06-15 ENCOUNTER — Telehealth: Payer: Self-pay

## 2015-06-15 LAB — CA 125: CA 125: 16 U/mL (ref <35)

## 2015-06-15 NOTE — Addendum Note (Signed)
Addended by: Joylene John D on: 06/15/2015 02:40 PM   Modules accepted: Orders

## 2015-06-15 NOTE — Telephone Encounter (Signed)
Orders received from [facility] to contact th patient with CA 126 level was ( 16 ) normal . Patient contacted , no answer , left a detailed message with CA 125 level being normal. Left call back information if the patient has additional questions.

## 2015-06-19 ENCOUNTER — Ambulatory Visit: Payer: 59 | Attending: Gynecologic Oncology | Admitting: Physical Therapy

## 2015-06-19 ENCOUNTER — Encounter: Payer: Self-pay | Admitting: Physical Therapy

## 2015-06-19 DIAGNOSIS — N949 Unspecified condition associated with female genital organs and menstrual cycle: Secondary | ICD-10-CM | POA: Insufficient documentation

## 2015-06-19 DIAGNOSIS — R102 Pelvic and perineal pain: Secondary | ICD-10-CM

## 2015-06-19 LAB — CYTOLOGY - PAP

## 2015-06-19 NOTE — Therapy (Addendum)
[facility] [address] [place], [place], [address] Phone: [phone]   Fax:  [phone]  Physical Therapy Evaluation  Patient Details  Name: Kristie Howell MRN: [id number] Date of Birth: 1950/10/20 Referring Provider: Dr. Joylene John  Encounter Date: 06/19/2015      PT End of Session - 06/19/15 1556    Visit Number 1   Date for PT Re-Evaluation 10/18/15   PT Start Time 1510   PT Stop Time 1537   PT Time Calculation (min) 27 min   Activity Tolerance Patient tolerated treatment well   Behavior During Therapy [facility] for tasks assessed/performed      Past Medical History  Diagnosis Date  . PMB (postmenopausal bleeding)   . Hemorrhoids     internal/external seen on colonoscopy 02/2011  . Diverticulosis 02/2011  . Hyperplastic colon polyp 02/2011    Dr. Watt Climes q51yr(+FHx)  . Insomnia 2012  . Abnormal TSH     borderline; yearly checks  . Anxiety 12-04-11    prone to panic attacks if stressed, heart rate increases  . PONV (postoperative nausea and vomiting)   . Ovarian tumor of borderline malignancy     Past Surgical History  Procedure Laterality Date  . Cesarean section  1985 1987    x2  . Lymph node dissection  12/10/2011    Procedure: LYMPH NODE DISSECTION;  Surgeon: PImagene GurneyA. GAlycia Rossetti MD;  Location: [facility];  Service: Gynecology;  Laterality: N/A;  Possible Lymph Nodes  . Mass excision  06/26/2012    Procedure: MINOR EXCISION OF MASS;  Surgeon: RCammie Sickle, MD;  Location[facility]  Service: Orthopedics;  Laterality: Left;  mucoid cyst left long finger   . Finger surgery      left middle finger (same as above)  . Abdominal hysterectomy  11/2011    Total robotic hysterectomy bilateral salpingo-oophorectomy, left pelvic lymph node dissection and left para-aortic lymph node dissection, omental biopsy, peritoneal biopsy    There were no vitals filed for this visit.  Visit Diagnosis:   Perineal pain in female - Plan: PT plan of care cert/re-cert      Subjective Assessment - 06/19/15 1519    Subjective since hysterctomy has had alot of pain with intercourse.  Patient has tried premerin.  MD feels like I should relax more. Pain during intercourse and afterwards. Hysterectomy was 4 years ago.    Patient Stated Goals decrease pain with sex   Currently in Pain? Yes   Pain Score 8    Pain Location Vagina  initial penetration and deeper   Pain Orientation Mid   Pain Type Chronic pain   Pain Onset More than a month ago   Pain Frequency Intermittent   Aggravating Factors  intercourse; vaginal exam   Pain Relieving Factors no intecourse            [facility] PT Assessment - 06/19/15 0001    Assessment   Medical Diagnosis C54.1 Endometrial Ca; D39.10 Ovarian neoplasm with low malignant potential   Referring Provider Dr. MJoylene John  Onset Date/Surgical Date 11/13/11   Prior Therapy None   Precautions   Precautions Other (comment)   Precaution Comments cancer precautions   Balance Screen   Has the patient fallen in the past 6 months No   Has the patient had a decrease in activity level because of a fear of falling?  No   Is the patient reluctant to leave their home  because of a fear of falling?  No   Prior Function   Level of Independence Independent   Vocation Other (comment)   Leisure walk    Cognition   Overall Cognitive Status Within Functional Limits for tasks assessed   Observation/Other Assessments   Focus on Therapeutic Outcomes (FOTO)  12% limitation    ROM / Strength   AROM / PROM / Strength AROM;Strength   AROM   Overall AROM  Within functional limits for tasks performed   Strength   Right Hip Extension 4/5   Right Hip ABduction 3+/5   Left Hip Extension 3+/5   Left Hip ABduction 4/5   Palpation   SI assessment  right ilium is anteriorly rotated; sacrum rotated left                 Pelvic Floor Special Questions - 06/19/15 0001     Are you Pregnant or attempting pregnancy? No   Prior Pregnancies Yes   Number of Pregnancies 2   Number of C-Sections 2   Any difficulty with labor and deliveries No   Currently Sexually Active Yes   Is this Painful Yes   Marinoff Scale pain interrupts completion   Urinary Leakage No   Urinary urgency No   Exam Type Deferred   Palpation no palpable tenderness located externally                  PT Education - 06/19/15 1555    Education provided Yes   Education Details moisturizers, lubricants, perineal massage   Person(s) Educated Patient   Methods Explanation;Demonstration;Handout   Comprehension Returned demonstration;Verbalized understanding          PT Short Term Goals - 06/19/15 1605    PT SHORT TERM GOAL #1   Title understand how to perform selft perineal soft tissue work   Time 4   Period Weeks   Status New   PT SHORT TERM GOAL #2   Title pain with intercourse decreased >/= 25%   Time 4   Period Weeks   Status New   PT SHORT TERM GOAL #3   Title understand how to use moisturizers and lubricants to rehydrate the muscous area of vaginal canal   Time 4   Period Weeks   Status New   PT SHORT TERM GOAL #4   Title independent wth flexibility exercises   Time 4   Period Weeks   Status New           PT Long Term Goals - 06/19/15 1607    PT LONG TERM GOAL #1   Title independent with use of dilators to massage the pelvic floor musculature   Time 4   Period Months   Status New   PT LONG TERM GOAL #2   Title pain with intercourse decreased >/= 75%   Time 4   Period Months   Status New   PT LONG TERM GOAL #3   Title bilateral hip strength 4+/5   Time 4   Period Weeks   Status New   PT LONG TERM GOAL #4   Title understand relaxation exercises to relax the pelvic floor muscles with intercourse and vaginal exams   Time 4   Period Months   Status New               Plan - 06/19/15 1557    Clinical Impression Statement Patient is a 64  year old female with diagnosis of enometrial Ca, neoplasm with low  malignant potential. Patient deferred pelvic floor assessment internally.  Patient reports her pain with intercourse began after complete hysterectomy 11/2011.  Patient reports the pain is a 8/10 with initial penetration and deeper penetration.  Patient reports at times the pain will make her have to stop intercourse.  Hip strength right/left/5: abduction 3+/4/5 and extension 4/3+/5.  Right ilium is anteriorly rotated and sacrum is rotated left. FOTO score is 12% limitation.  Patient has dilators at home.  Patient is using permerin daily to see if it helps.  Patient wil lbenefit from physical therapy to reduce pain with penile penetration.    Pt will benefit from skilled therapeutic intervention in order to improve on the following deficits Decreased strength;Pain;Increased muscle spasms;Increased fascial restricitons   Rehab Potential Excellent   Clinical Impairments Affecting Rehab Potential None   PT Frequency 1x / week   PT Duration Other (comment)  4 months   PT Treatment/Interventions Therapeutic exercise;Therapeutic activities;Neuromuscular re-education;Patient/family education;Manual techniques   PT Next Visit Plan instruction on use of dilator, flexibility exercises, diaphragmatic breathing   PT Home Exercise Plan how to use dilator   Recommended Other Services None   Consulted and Agree with Plan of Care Patient         Problem List Patient Active Problem List   Diagnosis Date Noted  . Ovarian neoplasm with low malignant potential 05/20/2013  . Pelvic mass in female 12/11/2011  . Endometrioid adenocarcinoma 12/11/2011  . Insomnia 10/30/2011  . PMB (postmenopausal bleeding)     Dhruvan Gullion,PT 06/19/2015, 4:12 PM  [facility] [address] [place], [place], [address] Phone: [phone]   Fax:  [phone]  Name: Kristie Howell MRN: [id number] Date  of Birth: [birth year]   PHYSICAL THERAPY DISCHARGE SUMMARY  Visits from Start of Care: 1  Current functional level related to goals / functional outcomes: See Above. Patient only attended the initial evaluation.    Remaining deficits: See above.   Education / Equipment: HEP Plan:                                                    Patient goals were not met. Patient is being discharged due to not returning since the last visit. Thank you for the referral. Earlie Counts, PT 09/13/2015 3:16 PM   ?????

## 2015-06-19 NOTE — Patient Instructions (Signed)
Moisturizers . They are used in the vagina to hydrate the mucous membrane that make up the vaginal canal. . Designed to keep a more normal acid balance (ph) . Once placed in the vagina, it will last between two to three days.  . Use 2-3 times per week at bedtime and last longer than 60 min. . Ingredients to avoid is glycerin and fragrance, can increase chance of infection . Should not be used just before sex due to causing irritation . Most are gels administered either in a tampon-shaped applicator or as a vaginal suppository. They are non-hormonal.   Types of Moisturizers . Replens . Samul Dada . Vitamin E vaginal suppositories . Moist Again . Feminease . Coconut oil- can break down condoms . Hyalo  Lubrication . Used for intercourse to reduce friction . Avoid ones that have glycerin, warming gels, tingling gels, icing or cooling gel, scented . May need to be reapplied once or several times during sexual activity . Can be applied to both partners genitals prior to vaginal penetration to minimize friction or irritation . Prevent irritation and mucosal tears that cause post coital pain and increased the risk of vaginal and urinary tract infections . Oil-based lubricants cannot be used with condoms due to breaking them down.  Least likely to irritate vaginal tissue.  . Plant based-lubes are safe . Silicone-based lubrication are thicker and last long and used for post-menopausal women Types of Lubricants . Good Clean Love . Slippery Stuff . Sylk . Blossom Organics . Samul Dada . Coconut oil . Aloe Vera . Sliquid  Things to avoid in the vaginal area . Do not use things to irritate the vulvar area . No lotions . No soaps; can use Aveeno or Calendula cleanser if needed. Must be gentle . No deodorants . No douches . Good to sleep without underwear to let the vaginal area to air out . No scrubbing: spread the lips to let warm water rinse over labias and pat dry .   Things to avoid in  the vaginal area . Do not use things to irritate the vulvar area . No lotions . No soaps; can use Aveeno or Calendula cleanser if needed. Must be gentle . No deodorants . No douches . Good to sleep without underwear to let the vaginal area to air out . No scrubbing: spread the lips to let warm water rinse over labias and pat dry  STRETCHING THE PELVIC FLOOR MUSCLES NO DILATOR  Supplies . Vaginal lubricant . Mirror (optional) . Gloves (optional) Positioning . Start in a semi-reclined position with your head propped up. Bend your knees and place your thumb or finger at the vaginal opening. Procedure   . Apply a moderate amount of lubricant on the outer skin of your vagina, the labia minora.  Apply additional lubricant to your finger. Marland Kitchen Spread the skin away from the vaginal opening. Place the end of your finger at the opening. . Do a maximum contraction of the pelvic floor muscles. Tighten the vagina and the anus maximally and relax. . When you know they are relaxed, gently and slowly insert your finger into your vagina, directing your finger slightly downward, for 2-3 inches of insertion. . Relax and stretch the 6 o'clock position . Hold each stretch for _2 min__ and repeat __1_ time with rest breaks of _1__ seconds between each stretch. . Repeat the stretching in the 4 o'clock and 8 o'clock positions. . Total time should be _6__ minutes, _1__ x per day.  Note  the amount of theme your were able to achieve and your tolerance to your finger in your vagina. . Once you have accomplished the techniques you may try them in standing with one foot resting on the tub, or in other positions.  This is a good stretch to do in the shower if you don't need to use lubricant.   [facility] [address] [place], [place] [address] Phone # [phone] Fax [phone]

## 2015-06-20 ENCOUNTER — Telehealth: Payer: Self-pay

## 2015-06-20 NOTE — Telephone Encounter (Signed)
Orders received from [facility] to contact the patient with PAP results are "normal" from 06/14/2015 , Attempted to reach the patient , no answer , left a detailed message with call back information if additional questions arise.

## 2015-07-05 ENCOUNTER — Ambulatory Visit: Payer: 59 | Admitting: Gynecologic Oncology

## 2015-07-18 ENCOUNTER — Encounter: Payer: 59 | Admitting: Physical Therapy

## 2015-10-25 ENCOUNTER — Ambulatory Visit (INDEPENDENT_AMBULATORY_CARE_PROVIDER_SITE_OTHER): Payer: 59 | Admitting: Family Medicine

## 2015-10-25 ENCOUNTER — Encounter: Payer: Self-pay | Admitting: Family Medicine

## 2015-10-25 VITALS — BP 102/70 | HR 68 | Ht 65.5 in | Wt 132.0 lb

## 2015-10-25 DIAGNOSIS — Z0000 Encounter for general adult medical examination without abnormal findings: Secondary | ICD-10-CM | POA: Diagnosis not present

## 2015-10-25 DIAGNOSIS — G4700 Insomnia, unspecified: Secondary | ICD-10-CM

## 2015-10-25 DIAGNOSIS — F411 Generalized anxiety disorder: Secondary | ICD-10-CM

## 2015-10-25 LAB — POCT URINALYSIS DIPSTICK
BILIRUBIN UA: NEGATIVE
GLUCOSE UA: NEGATIVE
KETONES UA: NEGATIVE
Leukocytes, UA: NEGATIVE
Nitrite, UA: NEGATIVE
PH UA: 6.5
Protein, UA: NEGATIVE
RBC UA: NEGATIVE
SPEC GRAV UA: 1.01
Urobilinogen, UA: NEGATIVE

## 2015-10-25 MED ORDER — ALPRAZOLAM 0.25 MG PO TABS: 0.2500 mg | ORAL_TABLET | Freq: Three times a day (TID) | ORAL | Status: DC | PRN

## 2015-10-25 MED ORDER — ZOLPIDEM TARTRATE 5 MG PO TABS: ORAL_TABLET | ORAL | Status: DC

## 2015-10-25 NOTE — Patient Instructions (Signed)
  HEALTH MAINTENANCE RECOMMENDATIONS:  It is recommended that you get at least 30 minutes of aerobic exercise at least 5 days/week (for weight loss, you may need as much as 60-90 minutes). This can be any activity that gets your heart rate up. This can be divided in 10-15 minute intervals if needed, but try and build up your endurance at least once a week.  Weight bearing exercise is also recommended twice weekly.  Eat a healthy diet with lots of vegetables, fruits and fiber.  "Colorful" foods have a lot of vitamins (ie green vegetables, tomatoes, red peppers, etc).  Limit sweet tea, regular sodas and alcoholic beverages, all of which has a lot of calories and sugar.  Up to 1 alcoholic drink daily may be beneficial for women (unless trying to lose weight, watch sugars).  Drink a lot of water.  Calcium recommendations are 1200-1500 mg daily (1500 mg for postmenopausal women or women without ovaries), and vitamin D 1000 IU daily.  This should be obtained from diet and/or supplements (vitamins), and calcium should not be taken all at once, but in divided doses.  Monthly self breast exams and yearly mammograms for women over the age of 18 is recommended.  Sunscreen of at least SPF 30 should be used on all sun-exposed parts of the skin when outside between the hours of 10 am and 4 pm (not just when at beach or pool, but even with exercise, golf, tennis, and yard work!)  Use a sunscreen that says "broad spectrum" so it covers both UVA and UVB rays, and make sure to reapply every 1-2 hours.  Remember to change the batteries in your smoke detectors when changing your clock times in the spring and fall.  Use your seat belt every time you are in a car, and please drive safely and not be distracted with cell phones and texting while driving.  Please schedule your mammogram!  Continue to use the zolpidem as needed for insomnia. Use the alprazolam (generic for xanax) if needed for anxiety.  Since you seem to  be sensitive to medications, you might want to try just 1/2 tablet if taking it during the day.   If ineffective, take the other half, and the next time you can use the full tablet. Use caution with driving. If taking it in the evening, and not driving (and not drinking alcohol--do not mix this medication with alcohol), you may use up to 2 tablets (will be more sedating).

## 2015-10-25 NOTE — Progress Notes (Signed)
Chief Complaint  Patient presents with  . Annual Exam    nonfasting annual exam no pap, sees Dr Alycia Rossetti twice yearly. Still having sleep issues. Also feels like she has become more anxious at times, not all the time.    Kristie Howell is a 65 y.o. female who presents for a complete physical.  She has the following concerns:  Insomnia: She still requires 1/2 tablet of ambien about 2x/week. This is actually a dose cut for her, since her dose was decreased from 10mg  to 5mg  last year.  She has trouble falling asleep. Melatonin and various teas, and other OTC meds weren't helpful.  Anxiety over all is better compared to last year (when very stressed related to husband's recurrent thyroid cancer and many doctor visits). He is now doing well.  She still has some intermittent anxious days, anxious with plane trips, etc.  Asking for a medication to use prn, not daily.   Immunization History  Administered Date(s) Administered  . Influenza Split 05/05/2013  . Influenza,inj,Quad PF,36+ Mos 05/16/2014  . Tdap 03/23/2007  . Zoster 03/28/2013   Last Pap smear: yearly, 05/2015, but sees Dr. Alycia Rossetti every 6 months ( x 5 years) Last mammogram: 05/2013 (knows she is past due, too anxious to get) Last colonoscopy: 01/2011, Dr. Watt Climes, due this year Last DEXA: never Ophtho: yearly (04/2015) Dentist: every 6 months Exercise:Usual routine is walking about 4 days/week, trying to walk for about 45-18mins. No weight-bearing activity other than lifting a 77 month old grandchild. Age of menopause was 74; no family h/o osteoporosis  Past Medical History  Diagnosis Date  . PMB (postmenopausal bleeding)   . Hemorrhoids     internal/external seen on colonoscopy 02/2011  . Diverticulosis 02/2011  . Hyperplastic colon polyp 02/2011    Dr. Watt Climes q70yr (+FHx)  . Insomnia 2012  . Abnormal TSH     borderline; yearly checks  . Anxiety 12-04-11    prone to panic attacks if stressed, heart rate increases  . PONV  (postoperative nausea and vomiting)   . Ovarian tumor of borderline malignancy     Past Surgical History  Procedure Laterality Date  . Cesarean section  1985 1987    x2  . Lymph node dissection  12/10/2011    Procedure: LYMPH NODE DISSECTION;  Surgeon: Imagene Gurney A. Alycia Rossetti, MD;  Location: [facility];  Service: Gynecology;  Laterality: N/A;  Possible Lymph Nodes  . Mass excision  06/26/2012    Procedure: MINOR EXCISION OF MASS;  Surgeon: Cammie Sickle., MD;  Location: [facility];  Service: Orthopedics;  Laterality: Left;  mucoid cyst left long finger   . Finger surgery      left middle finger (same as above)  . Abdominal hysterectomy  11/2011    Total robotic hysterectomy bilateral salpingo-oophorectomy, left pelvic lymph node dissection and left para-aortic lymph node dissection, omental biopsy, peritoneal biopsy    Social History   Social History  . Marital Status: Married    Spouse Name: N/A  . Number of Children: 2  . Years of Education: N/A   Occupational History  . homemaker    Social History Main Topics  . Smoking status: Never Smoker   . Smokeless tobacco: Never Used  . Alcohol Use: 1.2 oz/week    2 Standard drinks or equivalent per week     Comment: 1-2 drinks per week.  . Drug Use: No  . Sexual Activity:    Partners: Male    Birth  Control/ Protection: Post-menopausal   Other Topics Concern  . Not on file   Social History Narrative   Lives with husband.  Son in [place], daughter in [place], [place]. 1 granddaughter    Family History  Problem Relation Age of Onset  . Thyroid disease Mother   . Melanoma Mother   . Diabetes Mother     borderline (in her 55's)  . Stroke Mother 65  . Hypertension Mother   . Cancer Father 34    Colon cancer  . Heart attack Maternal Grandmother   . Ovarian cancer Paternal Grandmother   . Breast cancer Neg Hx     Outpatient Encounter Prescriptions as of 10/25/2015  Medication Sig Note  . Multiple Vitamin (MULTI  VITAMIN PO) Take 1 tablet by mouth daily.   . Omega-3 Fatty Acids (OMEGA-3 FISH OIL PO) Take by mouth.   . zolpidem (AMBIEN) 5 MG tablet Take 1 tablet by mouth as needed for sleep 10/25/2015: Takes 1/2 tablet prn, about 2x/week  . conjugated estrogens (PREMARIN) vaginal cream Place 1 Applicatorful vaginally daily. (Patient not taking: Reported on 10/25/2015) 10/25/2015: Stopped using because it wasn't effective  . [DISCONTINUED] cholecalciferol (VITAMIN D) 1000 UNITS tablet Take 1,000 Units by mouth daily. 08/26/2013: Taking daily for about a year   No facility-administered encounter medications on file as of 10/25/2015.    Allergies  Allergen Reactions  . Sulfa Antibiotics Swelling and Rash    ROS: The patient denies anorexia, fever, weight changes, headaches, vision changes, decreased hearing, ear pain, sore throat, breast concerns, chest pain, palpitations, dizziness, syncope, dyspnea on exertion, cough, swelling, nausea, vomiting, diarrhea, constipation, abdominal pain, melena, hematochezia, indigestion/heartburn, hematuria, incontinence, dysuria, vaginal bleeding, discharge, odor or itch, genital lesions, joint pains (mild finger arthritis, unchanged); no numbness, tingling, weakness, tremor, suspicious skin lesions, depression, anxiety (intermittent), abnormal bleeding/bruising, or enlarged lymph nodes. Intermittent, mild palpitations (chronic), mostly at night, when anxious. Occasional slight tingling in her lips (much less often than in the past, thinks is related to when she is feeling anxious). Some arthritis developing in her fingers, occasional clicking in the right knee (not painful). Some easy bruising when she takes advil (prn headache, back pain after babysitting).   PHYSICAL EXAM:  BP 102/70 mmHg  Pulse 68  Ht 5' 5.5" (1.664 m)  Wt 132 lb (59.875 kg)  BMI 21.62 kg/m2  General Appearance:   Alert, cooperative, no distress, appears stated age  Head:   Normocephalic,  without obvious abnormality, atraumatic  Eyes:   PERRL, conjunctiva/corneas clear, EOM's intact, fundi   benign  Ears:   Normal TM's and external ear canals  Nose:  Nares normal, mucosa normal, no drainage or sinus tenderness  Throat:  Lips, mucosa, and tongue normal; teeth and gums normal  Neck:  Supple, no lymphadenopathy; thyroid: no enlargement/tenderness/nodules; no carotid  bruit or JVD  Back:  Spine nontender, no curvature, ROM normal, no CVAtenderness.  Lungs:   Clear to auscultation bilaterally without wheezes, rales or ronchi; respirations unlabored  Chest Wall:   No tenderness or deformity  Heart:   Regular rate and rhythm, S1 and S2 normal, no murmur, rub  or gallop  Breast Exam:   Deferred to GYN  Abdomen:   Soft, non-tender, nondistended, normoactive bowel sounds,   no masses, no hepatosplenomegaly  Genitalia:   Deferred to GYN     Extremities:  No clubbing, cyanosis or edema  Pulses:  2+ and symmetric all extremities  Skin:  Skin color, texture, turgor normal, no  rashes or lesions. Large bruise at her right hip (ran into a table last week).  Lymph nodes:  Cervical, supraclavicular, and axillary nodes normal  Neurologic:  CNII-XII intact, normal strength, sensation and gait; reflexes 2+ and symmetric throughout  Psych: Normal mood, affect, hygiene and grooming               ASSESSMENT/PLAN:  Annual physical exam - Plan: Visual acuity screening, POCT Urinalysis Dipstick  Insomnia - doing well with low dose (2.5mg ) zolpidem sporadically (2-3x/week) - Plan: zolpidem (AMBIEN) 5 MG tablet  Anxiety state - discussed relaxation techniques. risks/side effects of alprazolam reviewed, use prn - Plan: ALPRAZolam (XANAX) 0.25 MG tablet    Risks/side effects of alprazolam reviewed in detail.   Discussed monthly self breast exams and  yearly mammograms (past due--now has rx for alprazolam, so plans to schedule); at least 30 minutes of aerobic activity at least 5 days/week; proper sunscreen use reviewed; healthy diet, including goals of calcium and vitamin D intake and alcohol recommendations (less than or equal to 1 drink/day) reviewed; regular seatbelt use; changing batteries in smoke detectors. Immunization recommendations discussed--pneumonia vaccine (prevnar-13) at age 55; yearly flu shots.Tetanus booster due next year. Colonoscopy recommendations reviewed--due in July. DEXA age 39 (currently only 5 years postmenopausal).   No labs needed today. Last lipids normal within the year. Vitamin D and CBC normal in 2015 on current supplements.    Continue to use the zolpidem as needed for insomnia. Use the alprazolam (generic for xanax) if needed for anxiety.  Since you seem to be sensitive to medications, you might want to try just 1/2 tablet if taking it during the day.   If ineffective, take the other half, and the next time you can use the full tablet. Use caution with driving. If taking it in the evening, and not driving (and not drinking alcohol--do not mix this medication with alcohol), you may use up to 2 tablets (will be more sedating).

## 2015-11-29 ENCOUNTER — Other Ambulatory Visit: Payer: 59

## 2015-11-29 ENCOUNTER — Encounter: Payer: Self-pay | Admitting: Gynecologic Oncology

## 2015-11-29 ENCOUNTER — Ambulatory Visit: Payer: 59 | Attending: Gynecologic Oncology | Admitting: Gynecologic Oncology

## 2015-11-29 VITALS — BP 111/71 | HR 87 | Temp 98.6°F | Resp 18 | Ht 65.5 in | Wt 130.5 lb

## 2015-11-29 DIAGNOSIS — Z9889 Other specified postprocedural states: Secondary | ICD-10-CM | POA: Insufficient documentation

## 2015-11-29 DIAGNOSIS — C541 Malignant neoplasm of endometrium: Secondary | ICD-10-CM

## 2015-11-29 DIAGNOSIS — D3910 Neoplasm of uncertain behavior of unspecified ovary: Secondary | ICD-10-CM

## 2015-11-29 DIAGNOSIS — F419 Anxiety disorder, unspecified: Secondary | ICD-10-CM | POA: Insufficient documentation

## 2015-11-29 DIAGNOSIS — Z8542 Personal history of malignant neoplasm of other parts of uterus: Secondary | ICD-10-CM

## 2015-11-29 DIAGNOSIS — R971 Elevated cancer antigen 125 [CA 125]: Secondary | ICD-10-CM | POA: Diagnosis not present

## 2015-11-29 DIAGNOSIS — Z79899 Other long term (current) drug therapy: Secondary | ICD-10-CM | POA: Diagnosis not present

## 2015-11-29 NOTE — Progress Notes (Signed)
Consult Note: Gyn-Onc  Kristie Howell 65 y.o. female  CC:  Chief Complaint  Patient presents with  . Follow-up    Endometrial cancer    HPI: Patient is a very pleasant 65 year old gravida 2 para 2 was menopause about 2-1/2 years ago. She was on a combination hormone replacement therapy consisting of Vivelle-Dot we daily Prometrium. She states that this past summer she started having some vaginal bleeding and reported this to her previous gynecologist who performed and after sound that was unremarkable. She was reassured and or supplements to 4 months. She had recurrent bleeding in January of this year reported this to your gynecologist has similar performed a repeat ultrasound that was reassuring and was recommended that she change her hormone replacement therapy. She was uncomfortable with this and she went to see Dr. Cherylann Banas. He performed an ultrasound in saline effusion hysterogram they reveal a retroverted sac septated uterus with a right endometrial cavity measuring 4.5 mm in the left endometrial cavity measuring 5 mm. A biopsy was performed it revealed complex atypical hyperplasia and focal endometrioid adenocarcinoma grade 1.   She underwent a total robotic hysterectomy bilateral salpingo-oophorectomy left pelvic lymph node dissection) clinical dissection omental biopsy and peritoneal biopsies on 12/10/2011. Operative findings included a globular uterus which was retroflexed secondary to adhesions in the cul-de-sac. It was a 4 cm complex ovarian mass on the left side with significant amount of surface excrescence. Frozen section was consistent with at least a low malignant potential tumor. There was no tumor within the uterus on frozen section. She had a normal appearing appendix, no peritoneal disease, and no significant adenopathy.   Final pathology within the uterus revealed a grade 1 endometrioid adenocarcinoma superficially invading into the underlying myometrium 0.1 cm out of 2.0 cm.  There is no evidence of any lymphovascular space involvement. There were fibroids. The cervix was negative. Bilateral fallopian tubes were negative. Bilateral tumors were noted on the adnexa. The left ovary had a serous borderline tumor with microinvasion. The right ovary had a serous borderline tumor. Zero out of two left pelvic, 0/5 left para-aortic nodes were negative. There is no tumor in the omentum or any of the peritoneal based biopsies. Her CA 125 was checked immediately postoperatively was elevated at 191.9. Final [place] staging included a stage IA grade 1 endometrioid adenocarcinoma and a stage IC ovarian borderline tumor. There was microinvasion of the left side. The right side had a borderline tumor with no microinvasion.   I last saw her in 05/2015 at which time her exam was negative. Her CA-125 values have been norma (last 16 on 11/16). Her pap smear was negative in 11/16.  Interval History:   She was last seen by Dr. Tomi Bamberger in April 2017 for her annual examination. Issues at that time revolves around her having some sleep issues and also some feelings of anxiety. She is also due for colonoscopy in July.   She is overall doing very well. She has a granddaughter Kristie Howell is about a year and a half old. She continues to have some issues with intercourse but is using coconut oil. There are no new medical problems in the family. She does exercise every day. She has modified her diet a little bit and believes that that is responsible for her 3 pound weight loss since we last saw her. Every 4-6 vacation this summer seeing her daughter in [place] and [place] with her granddaughter at [employer].  Review of Systems: Constitutional:  Denies fever.  Cardiovascular: No chest pain, shortness of breath, or edema  Pulmonary: No cough Gastro Intestinal:   No nausea, vomiting, constipation, or diarrhea reported. No change in bowel movement. No early satiety, no bloating Genitourinary: Denies vaginal  bleeding and discharge. Positive dyspareunia and vaginal dryness Psychology: Some insomnia  Current Meds:  Outpatient Encounter Prescriptions as of 11/29/2015  Medication Sig  . Multiple Vitamin (MULTI VITAMIN PO) Take 1 tablet by mouth daily.  . Omega-3 Fatty Acids (OMEGA-3 FISH OIL PO) Take by mouth.  . zolpidem (AMBIEN) 5 MG tablet Take 1 tablet by mouth as needed for sleep  . ALPRAZolam (XANAX) 0.25 MG tablet Take 1 tablet (0.25 mg total) by mouth 3 (three) times daily as needed for anxiety. (Patient not taking: Reported on 11/29/2015)  . conjugated estrogens (PREMARIN) vaginal cream Place 1 Applicatorful vaginally daily. (Patient not taking: Reported on 10/25/2015)   No facility-administered encounter medications on file as of 11/29/2015.    Allergy:  Allergies  Allergen Reactions  . Sulfa Antibiotics Swelling and Rash    Social Hx:   Social History   Social History  . Marital Status: Married    Spouse Name: N/A  . Number of Children: 2  . Years of Education: N/A   Occupational History  . homemaker    Social History Main Topics  . Smoking status: Never Smoker   . Smokeless tobacco: Never Used  . Alcohol Use: 1.2 oz/week    2 Standard drinks or equivalent per week     Comment: 1-2 drinks per week.  . Drug Use: No  . Sexual Activity:    Partners: Male    Birth Control/ Protection: Post-menopausal   Other Topics Concern  . Not on file   Social History Narrative   Lives with husband.  Son in [place], daughter in [place], [place]. 1 granddaughter    Past Surgical Hx:  Past Surgical History  Procedure Laterality Date  . Cesarean section  1985 1987    x2  . Lymph node dissection  12/10/2011    Procedure: LYMPH NODE DISSECTION;  Surgeon: Imagene Gurney A. Alycia Rossetti, MD;  Location: [facility];  Service: Gynecology;  Laterality: N/A;  Possible Lymph Nodes  . Mass excision  06/26/2012    Procedure: MINOR EXCISION OF MASS;  Surgeon: Cammie Sickle., MD;  Location: [place];  Service: Orthopedics;  Laterality: Left;  mucoid cyst left long finger   . Finger surgery      left middle finger (same as above)  . Abdominal hysterectomy  11/2011    Total robotic hysterectomy bilateral salpingo-oophorectomy, left pelvic lymph node dissection and left para-aortic lymph node dissection, omental biopsy, peritoneal biopsy    Past Medical Hx:  Past Medical History  Diagnosis Date  . PMB (postmenopausal bleeding)   . Hemorrhoids     internal/external seen on colonoscopy 02/2011  . Diverticulosis 02/2011  . Hyperplastic colon polyp 02/2011    Dr. Watt Climes q30yr (+FHx)  . Insomnia 2012  . Abnormal TSH     borderline; yearly checks  . Anxiety 12-04-11    prone to panic attacks if stressed, heart rate increases  . PONV (postoperative nausea and vomiting)   . Ovarian tumor of borderline malignancy     Family Hx:  Family History  Problem Relation Age of Onset  . Thyroid disease Mother   . Melanoma Mother   . Diabetes Mother     borderline (in her 20's)  . Stroke Mother 19  . Hypertension  Mother   . Cancer Father 49    Colon cancer  . Heart attack Maternal Grandmother   . Ovarian cancer Paternal Grandmother   . Breast cancer Neg Hx     Vitals:  Blood pressure 111/71, pulse 87, temperature 98.6 F (37 C), temperature source Oral, resp. rate 18, height 5' 5.5" (1.664 m), weight 130 lb 8 oz (59.194 kg), SpO2 100 %.  Physical Exam: Well-nourished well-developed female in no acute distress.  Neck: Supple, no lymphadenopathy, no thyromegaly.  Lungs: Clear to auscultation bilaterally.  Cardiovascular: Regular rate and rhythm.  Abdomen: Well-healed surgical incisions. Abdomen is soft, nontender, nondistended. There is no palpable masses. There is no hepatosplenomegaly. There are no incisional hernias.  Groins: No lymphadenopathy.  Extremities: No edema.  Pelvic: Normal external female genitalia. Vagina is atrophic. There are no visible lesions.  Bimanual  examination reveals no masses or nodularity. Rectal confirms no nodularity.   Assessment/Plan: 65 year old with this clinical stage IA 1 endometrioid adenocarcinoma who also has a stage IC ovarian borderline tumor with elevated CA 125 diagnosed and treated in May 2013. She is at her 4 year anniversary mark.. There was microinvasion on the left side. We will followup in results of her CA 125 from today.  She will return in 6 months. She will be released from our clinic in May 2018 and then can follow-up with Dr. Tomi Bamberger for routine GYN care.     Jemiah Cuadra A., MD 11/29/2015, 10:42 AM

## 2015-11-29 NOTE — Patient Instructions (Signed)
We will notify you of your lab work from today. Please return to see [place] in 6 months.

## 2015-11-30 ENCOUNTER — Telehealth: Payer: Self-pay | Admitting: Gynecologic Oncology

## 2015-11-30 LAB — CA 125: CANCER ANTIGEN (CA) 125: 17.5 U/mL (ref 0.0–38.1)

## 2015-11-30 LAB — CANCER ANTIGEN 125 (PARALLEL TESTING): CA 125: 16 U/mL (ref <35)

## 2015-11-30 NOTE — Telephone Encounter (Signed)
Left message with CA 125 results and explained the parallel testing that she would see in Mychart.  Advised to call the office for any questions or concerns.

## 2015-12-25 ENCOUNTER — Encounter: Payer: 59 | Admitting: Family Medicine

## 2016-04-04 LAB — HM COLONOSCOPY

## 2016-04-09 ENCOUNTER — Encounter: Payer: Self-pay | Admitting: Family Medicine

## 2016-05-01 ENCOUNTER — Other Ambulatory Visit: Payer: Self-pay | Admitting: *Deleted

## 2016-05-01 ENCOUNTER — Telehealth: Payer: Self-pay

## 2016-05-01 DIAGNOSIS — G4700 Insomnia, unspecified: Secondary | ICD-10-CM

## 2016-05-01 MED ORDER — ZOLPIDEM TARTRATE 5 MG PO TABS: ORAL_TABLET | ORAL | 1 refills | Status: DC

## 2016-05-01 NOTE — Telephone Encounter (Signed)
Phoned in.

## 2016-05-01 NOTE — Telephone Encounter (Signed)
Pt requesting refill of zolpidem to [facility]. Thank you, Wells Guiles

## 2016-05-01 NOTE — Telephone Encounter (Signed)
Last filled #30 with 1 refill in April. Has CPE sched for April. Okay to refill #30 with 1 refill.

## 2016-06-05 ENCOUNTER — Ambulatory Visit: Payer: 59 | Admitting: Gynecologic Oncology

## 2016-07-02 ENCOUNTER — Telehealth: Payer: Self-pay | Admitting: Nurse Practitioner

## 2016-07-02 NOTE — Telephone Encounter (Signed)
Per Lenna Sciara, NP, patient rescheduled from 07/03/16 to 07/17/16 at 12:00. Patient aware of new apt date/time and is agreeable to switching dates.

## 2016-07-03 ENCOUNTER — Ambulatory Visit: Payer: 59 | Admitting: Gynecologic Oncology

## 2016-07-17 ENCOUNTER — Other Ambulatory Visit (HOSPITAL_BASED_OUTPATIENT_CLINIC_OR_DEPARTMENT_OTHER): Payer: 59

## 2016-07-17 ENCOUNTER — Encounter: Payer: Self-pay | Admitting: Gynecologic Oncology

## 2016-07-17 ENCOUNTER — Other Ambulatory Visit (HOSPITAL_COMMUNITY)
Admission: RE | Admit: 2016-07-17 | Discharge: 2016-07-17 | Disposition: A | Discharge status: Home / Self Care | Payer: 59 | Source: Ambulatory Visit | Attending: Gynecologic Oncology | Admitting: Gynecologic Oncology

## 2016-07-17 ENCOUNTER — Ambulatory Visit: Payer: 59 | Attending: Gynecologic Oncology | Admitting: Gynecologic Oncology

## 2016-07-17 ENCOUNTER — Other Ambulatory Visit: Payer: Self-pay | Admitting: Gynecologic Oncology

## 2016-07-17 VITALS — BP 135/73 | HR 110 | Temp 98.3°F | Resp 18 | Ht 65.5 in | Wt 130.4 lb

## 2016-07-17 DIAGNOSIS — Z01411 Encounter for gynecological examination (general) (routine) with abnormal findings: Secondary | ICD-10-CM | POA: Insufficient documentation

## 2016-07-17 DIAGNOSIS — Z8542 Personal history of malignant neoplasm of other parts of uterus: Secondary | ICD-10-CM | POA: Diagnosis not present

## 2016-07-17 DIAGNOSIS — D3912 Neoplasm of uncertain behavior of left ovary: Secondary | ICD-10-CM

## 2016-07-17 DIAGNOSIS — C541 Malignant neoplasm of endometrium: Secondary | ICD-10-CM | POA: Diagnosis present

## 2016-07-17 NOTE — Patient Instructions (Addendum)
We will notify you of your results from today. Please return to see [place] in 6 months. That will be your 5 year anniversary and you will be released from our clinic. Happy New Year!

## 2016-07-17 NOTE — Progress Notes (Signed)
Consult Note: Gyn-Onc  Kristie Howell 66 y.o. female  CC:  Chief Complaint  Patient presents with  . endometrial cancer    HPI: Patient is a very pleasant 66 year old gravida 2 para 2 was menopause about 2-1/2 years ago. She was on a combination hormone replacement therapy consisting of Vivelle-Dot we daily Prometrium. She states that this past summer she started having some vaginal bleeding and reported this to her previous gynecologist who performed and after sound that was unremarkable. She was reassured and or supplements to 4 months. She had recurrent bleeding in January of this year reported this to your gynecologist has similar performed a repeat ultrasound that was reassuring and was recommended that she change her hormone replacement therapy. She was uncomfortable with this and she went to see Dr. Cherylann Banas. He performed an ultrasound in saline effusion hysterogram they reveal a retroverted sac septated uterus with a right endometrial cavity measuring 4.5 mm in the left endometrial cavity measuring 5 mm. A biopsy was performed it revealed complex atypical hyperplasia and focal endometrioid adenocarcinoma grade 1.   She underwent a total robotic hysterectomy bilateral salpingo-oophorectomy left pelvic lymph node dissection) clinical dissection omental biopsy and peritoneal biopsies on 12/10/2011. Operative findings included a globular uterus which was retroflexed secondary to adhesions in the cul-de-sac. It was a 4 cm complex ovarian mass on the left side with significant amount of surface excrescence. Frozen section was consistent with at least a low malignant potential tumor. There was no tumor within the uterus on frozen section. She had a normal appearing appendix, no peritoneal disease, and no significant adenopathy.   Final pathology within the uterus revealed a grade 1 endometrioid adenocarcinoma superficially invading into the underlying myometrium 0.1 cm out of 2.0 cm. There is no  evidence of any lymphovascular space involvement. There were fibroids. The cervix was negative. Bilateral fallopian tubes were negative. Bilateral tumors were noted on the adnexa. The left ovary had a serous borderline tumor with microinvasion. The right ovary had a serous borderline tumor. Zero out of two left pelvic, 0/5 left para-aortic nodes were negative. There is no tumor in the omentum or any of the peritoneal based biopsies. Her CA 125 was checked immediately postoperatively was elevated at 191.9. Final [place] staging included a stage IA grade 1 endometrioid adenocarcinoma and a stage IC ovarian borderline tumor. There was microinvasion of the left side. The right side had a borderline tumor with no microinvasion.   I last saw her in 11/2015 at which time her exam was negative. Her CA-125 values have been normal (last 16 and 17.5 on 5/17). Her pap smear was negative in 11/16.  Interval History:   She is overall doing very well. She has a granddaughter Kristie Howell is about 91 years old. Her son is expecting another girl and her daughter who lives in [place] is expecting her first child. She's overall doing quite well the only complaint she has is the arthritis her hands is increasing in terms of symptoms. It is most consistent with osteoarthritis and as she moves her hands during the dates improved. It is worse in the cold weather. There are no new medical problems in the family. Her mammogram is scheduled for later this month. She did have a colonoscopy in September. There were no polyps. It was recommended that she have follow-up in 5 years.  Review of Systems: Constitutional:  Denies fever. Cardiovascular: No chest pain, shortness of breath, or edema  Pulmonary: No cough Gastro Intestinal:  No nausea, vomiting, constipation, or diarrhea reported. No change in bowel movement. No early satiety, no bloating Genitourinary: Denies vaginal bleeding and discharge. Positive dyspareunia and vaginal  dryness Musculoskeletal:+ arthritis hands Psychology: Some insomnia  Current Meds:  Outpatient Encounter Prescriptions as of 07/17/2016  Medication Sig Dispense Refill  . ALPRAZolam (XANAX) 0.25 MG tablet Take 1 tablet (0.25 mg total) by mouth 3 (three) times daily as needed for anxiety. (Patient not taking: Reported on 11/29/2015) 20 tablet 0  . Multiple Vitamin (MULTI VITAMIN PO) Take 1 tablet by mouth daily.    . Omega-3 Fatty Acids (OMEGA-3 FISH OIL PO) Take by mouth.    . zolpidem (AMBIEN) 5 MG tablet Take 1 tablet by mouth as needed for sleep 30 tablet 1  . [DISCONTINUED] conjugated estrogens (PREMARIN) vaginal cream Place 1 Applicatorful vaginally daily. (Patient not taking: Reported on 07/17/2016) 90 g 4   No facility-administered encounter medications on file as of 07/17/2016.     Allergy:  Allergies  Allergen Reactions  . Sulfa Antibiotics Swelling and Rash    Social Hx:   Social History   Social History  . Marital status: Married    Spouse name: N/A  . Number of children: 2  . 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 Smoking status: Never Smoker  . Smokeless tobacco: Never Used  . Alcohol use Not on file     Comment: 1-2 drinks per week.  . Drug use: Unknown  . Sexual activity: Yes    Partners: Male    Birth control/ protection: Post-menopausal   Other Topics Concern  . Not on file   Social History Narrative   Lives with husband.  Son in [place], daughter in [place], [place]. 1 granddaughter    Past Surgical Hx:  Past Surgical History:  Procedure Laterality Date  . ABDOMINAL HYSTERECTOMY  11/2011   Total robotic hysterectomy bilateral salpingo-oophorectomy, left pelvic lymph node dissection and left para-aortic lymph node dissection, omental biopsy, peritoneal biopsy  . [place]   x2  . FINGER SURGERY     left middle finger (same as above)  . LYMPH NODE DISSECTION  12/10/2011    Procedure: LYMPH NODE DISSECTION;  Surgeon: Imagene Gurney A. Alycia Rossetti, MD;  Location: [facility];  Service: Gynecology;  Laterality: N/A;  Possible Lymph Nodes  . MASS EXCISION  06/26/2012   Procedure: MINOR EXCISION OF MASS;  Surgeon: Cammie Sickle., MD;  Location: [place];  Service: Orthopedics;  Laterality: Left;  mucoid cyst left long finger     Past Medical Hx:  Past Medical History:  Diagnosis Date  . Abnormal TSH    borderline; yearly checks  . Anxiety 12-04-11   prone to panic attacks if stressed, heart rate increases  . Diverticulosis 02/2011  . Hemorrhoids    internal/external seen on colonoscopy 02/2011  . Hyperplastic colon polyp 02/2011   Dr. Watt Climes q84yr (+FHx)  . Insomnia 2012  . Ovarian tumor of borderline malignancy   . PMB (postmenopausal bleeding)   . PONV (postoperative nausea and vomiting)     Family Hx:  Family History  Problem Relation Age of Onset  . Thyroid disease Mother   . Melanoma Mother   . Diabetes Mother     borderline (in her 77's)  . Stroke Mother 32  . Hypertension Mother   . Cancer Father 66    Colon cancer  .  Ovarian cancer Paternal Grandmother   . Heart attack Maternal Grandmother   . Breast cancer Neg Hx     Vitals:  Blood pressure 135/73, pulse (!) 110, temperature 98.3 F (36.8 C), temperature source Oral, resp. rate 18, height 5' 5.5" (1.664 m), weight 130 lb 6.4 oz (59.1 kg), SpO2 99 %.  Physical Exam: Well-nourished well-developed female in no acute distress.  Neck: Supple, no lymphadenopathy, no thyromegaly.  Lungs: Clear to auscultation bilaterally.  Cardiovascular: Regular rate and rhythm.  Abdomen: Well-healed surgical incisions. Abdomen is soft, nontender, nondistended. There iareno palpable masses. There is no hepatosplenomegaly. There are no incisional hernias.  Groins: No lymphadenopathy.  Extremities: No edema.  Pelvic: Normal external female genitalia. Vagina is atrophic. There are no visible lesions.  Pap smear submitted without difficulty. Bimanual examination reveals no masses or nodularity. Rectal confirms no nodularity.   Assessment/Plan: 66 year old with this clinical stage IA 1 endometrioid adenocarcinoma who also has a stage IC ovarian borderline tumor with elevated CA 125 diagnosed and treated in May 2013. She is at her 4.5 year anniversary mark.. There was microinvasion on the left side. We will followup in results of her CA 125 and Pap smear from today.  She will return in 6 months. She will be released from our clinic in May 2018.     Cozette Braggs A., MD 07/17/2016, 11:48 AM

## 2016-07-18 LAB — CANCER ANTIGEN 125 (PARALLEL TESTING): CA 125: 15 U/mL (ref <35)

## 2016-07-18 LAB — CA 125: CANCER ANTIGEN (CA) 125: 17.7 U/mL (ref 0.0–38.1)

## 2016-07-24 LAB — CYTOLOGY - PAP: DIAGNOSIS: NEGATIVE

## 2016-08-07 ENCOUNTER — Ambulatory Visit (INDEPENDENT_AMBULATORY_CARE_PROVIDER_SITE_OTHER): Payer: 59 | Admitting: Family Medicine

## 2016-08-07 ENCOUNTER — Encounter: Payer: Self-pay | Admitting: Family Medicine

## 2016-08-07 VITALS — BP 120/80 | HR 84 | Temp 98.8°F | Ht 65.5 in | Wt 130.0 lb

## 2016-08-07 DIAGNOSIS — J069 Acute upper respiratory infection, unspecified: Secondary | ICD-10-CM

## 2016-08-07 DIAGNOSIS — F411 Generalized anxiety disorder: Secondary | ICD-10-CM | POA: Diagnosis not present

## 2016-08-07 MED ORDER — AZITHROMYCIN 250 MG PO TABS: ORAL_TABLET | ORAL | 0 refills | Status: DC

## 2016-08-07 NOTE — Progress Notes (Signed)
Chief Complaint  Patient presents with  . Cough    x 9 days. Mucus is somewhat yellow when she blows her nose-cough is not really productive. Had fevers in the beginning but not in the last couple of days. Chills and body aches x 1 night early on.    She had low grade fevers early on, and chest congestion. It had then moved up into her head, with sinus drainage, pain in her cheeks, sinus headaches, but this has improved. It has remained in her chest.  She is here with complaint of persistent cough, chest congestion and fatigue. She has decreased appetite. Cough has gotten worse--feels like there is a rattle in her chest, but she isn't able to expectorate any phlegm.  Feels deep.  Tried Robitussin CF at night a few nights, helped her sleep some. She used Advil Cold and sinus over the weekend when had the head congestion--this helped.  Also taking tylenol for the achiness.  +sick contact--husband with similar illness (she had it first).  64 yo granddaughter also had cough/fever.  At the end of her visit she had questions about anxiety--asking about lexapro (friend taking).  The xanax she has doesn't seem to work well.  PMH, PSH, SH reviewed  Outpatient Encounter Prescriptions as of 08/07/2016  Medication Sig  . Multiple Vitamin (MULTI VITAMIN PO) Take 1 tablet by mouth daily.  . Omega-3 Fatty Acids (OMEGA-3 FISH OIL PO) Take by mouth.  . zolpidem (AMBIEN) 5 MG tablet Take 1 tablet by mouth as needed for sleep  . ALPRAZolam (XANAX) 0.25 MG tablet Take 1 tablet (0.25 mg total) by mouth 3 (three) times daily as needed for anxiety. (Patient not taking: Reported on 11/29/2015)  . azithromycin (ZITHROMAX) 250 MG tablet Take 2 tablets by mouth on first day, then 1 tablet by mouth on days 2 through 5   No facility-administered encounter medications on file as of 08/07/2016.    (zpak rx'd today, not taking prior to visit)  Allergies  Allergen Reactions  . Sulfa Antibiotics Swelling and Rash     ROS:  Some mild nausea, no vomiting or diarrhea.  No bleeding, bruising, rashes.  Some heart racing after coughing spell.  No palpitations, no chest pain.  Sinus headaches resolved.  Slightly lightheaded with standing, possibly her equilibrium. See HPI for details  PHYSICAL EXAM:  BP 120/80 (BP Location: Right Arm, Patient Position: Sitting, Cuff Size: Normal)   Pulse 84   Temp 98.8 F (37.1 C) (Tympanic)   Ht 5' 5.5" (1.664 m)   Wt 130 lb (59 kg)   SpO2 98%   BMI 21.30 kg/m   Well appearing, pleasant female, in no distress. Speaking easily in full sentences.  Some sniffling and rare dry cough during visit. HEENT: PERRL, EOMI, conjunctiva and sclera are clear.  TM's and EAC's normal.  Nasal mucosa with mild edema; some yellow crusting in the left nare noted.  Sinuses are nontender. OP is clear. Neck: No lymphadenopathy or mass Heart: regular rate and rhythm without murmur Lungs: clear bilaterally. No wheezes, rales, ronchi; good air movement.  Deep breaths did not cause cough Skin: normal turgor, no rash Back: no spinal or CVA tenderness Extremities: no edema Neuro: alert and oriented, cranial nerves intact, normal gait Psych: normal mood, affect, hygiene and grooming.  Normal eye contact, speech.  Didn't appear anxious  ASSESSMENT/PLAN:  Acute upper respiratory infection - Plan: azithromycin (ZITHROMAX) 250 MG tablet  Anxiety state   Supportive measures reviewed in detail, and  if/when to start the antibiotics that were sent to her pharmacy (in 2-3 days if not improving with the Mucinex).   Counseled re: anxiety and when meds used preventatively. Discussed preventative vs prn meds.  If 0.25mg  xanax not effective, she can try taking 2 if not driving or using with alcohol, side effects reviewed Return for further discussion if needed, especially if increasing frequency of anxiety or impacting her daily activities. She may return sooner than next schedule wellness visit if she  would like to start or further discuss medications.     Drink plenty of fluid. Take guaifenesin regularly (I recommend Mucinex, plain or maximum strength, 12 hour, twice daily). You may continue/restart the Advil cold and sinus--you do still have some head congestion and the drainage may contribute to cough.  You may also use Delsym syrup (dextromethorphan) as needed for cough (as long as this ingredient isn't in any of your other medications).  Start the antibiotic in 2-3 days if you have persistent/worsening cough, discolored mucus, recurrent fever.

## 2016-08-07 NOTE — Patient Instructions (Signed)
  Drink plenty of fluid. Take guaifenesin regularly (I recommend Mucinex, plain or maximum strength, 12 hour, twice daily). You may continue/restart the Advil cold and sinus--you do still have some head congestion and the drainage may contribute to cough.  You may also use Delsym syrup (dextromethorphan) as needed for cough (as long as this ingredient isn't in any of your other medications).  Start the antibiotic in 2-3 days if you have persistent/worsening cough, discolored mucus, recurrent fever.

## 2016-09-30 ENCOUNTER — Encounter: Payer: Self-pay | Admitting: Family Medicine

## 2016-09-30 ENCOUNTER — Ambulatory Visit (INDEPENDENT_AMBULATORY_CARE_PROVIDER_SITE_OTHER): Payer: 59 | Admitting: Family Medicine

## 2016-09-30 VITALS — BP 110/70 | HR 88 | Temp 99.0°F | Ht 65.5 in | Wt 131.2 lb

## 2016-09-30 DIAGNOSIS — J029 Acute pharyngitis, unspecified: Secondary | ICD-10-CM

## 2016-09-30 DIAGNOSIS — J069 Acute upper respiratory infection, unspecified: Secondary | ICD-10-CM

## 2016-09-30 LAB — POCT RAPID STREP A (OFFICE): Rapid Strep A Screen: NEGATIVE

## 2016-09-30 MED ORDER — AMOXICILLIN 875 MG PO TABS: 875.0000 mg | ORAL_TABLET | Freq: Two times a day (BID) | ORAL | 0 refills | Status: DC

## 2016-09-30 NOTE — Patient Instructions (Signed)
  Drink plenty of fluids. Use tylenol and/or ibuprofen as needed for pain or fever.  Continue guaifenesin to help keep the mucus thin. Using a decongestant may help with the nasal congestion, and lessen the amount draining down the throat, which is contributing to throat pain (and may ultimately lead to some cough).   Chloraseptic spray and salt water gargles can help with pain.  If your fever persists and throat pain persists after another 24-48 hours (getting worse rather than better), then start the antibiotic.

## 2016-09-30 NOTE — Progress Notes (Signed)
Chief Complaint  Patient presents with  . Sore Throat    and right ear pain. Scratchy throat last week but really back ST started yesterday. She would like to make sure she does not have strep.    She babysat her 64 yo granddaughter last week, who had a bad cough, runny nose.  4 days ago she started with a scratchy throat.  Yesterday morning she woke up with very sore throat and right ear pain.  She has some right sided nasal congestion, but no significant drainage. She does report some postnasal drainage.  Currently the right side of the throat is painful (last night it was both sides).  When she blows her nose she gets green just in the morning, clear as the day goes on.  Denies any cough.  She has felt chilled.  +sick contact (granddaughter and son).  She took Robitussin Severe Sore Throat yesterday, don't think it helped much.  Tylenol hasn't helped much either (didn't take with robitussin, knows it has both).  PMH, PSH, SH reviewed  Outpatient Encounter Prescriptions as of 09/30/2016  Medication Sig Note  . ALPRAZolam (XANAX) 0.25 MG tablet Take 1 tablet (0.25 mg total) by mouth 3 (three) times daily as needed for anxiety.   . Multiple Vitamin (MULTI VITAMIN PO) Take 1 tablet by mouth daily.   . Omega-3 Fatty Acids (OMEGA-3 FISH OIL PO) Take by mouth.   . zolpidem (AMBIEN) 5 MG tablet Take 1 tablet by mouth as needed for sleep 09/30/2016: Uses prn--needed some last week when babysitting  . [DISCONTINUED] azithromycin (ZITHROMAX) 250 MG tablet Take 2 tablets by mouth on first day, then 1 tablet by mouth on days 2 through 5    No facility-administered encounter medications on file as of 09/30/2016.    Allergies  Allergen Reactions  . Sulfa Antibiotics Swelling and Rash  . Zithromax [Azithromycin] Other (See Comments)    Abdominal pain/cramping    ROS:  Denies vomiting or diarrhea, has had some nausea.  No high fevers. Denies chest pain.  Palpitations unchanged.  No shortness of breath.   Denies any bleeding/bruising/rash.  PHYSICAL EXAM:  BP 110/70 (BP Location: Right Arm, Patient Position: Sitting, Cuff Size: Normal)   Pulse 88   Temp 99 F (37.2 C) (Tympanic)   Ht 5' 5.5" (1.664 m)   Wt 131 lb 3.2 oz (59.5 kg)   BMI 21.50 kg/m   Well appearing, pleasant female in no distress HEENT: PERRL, EOMI, conjunctiva and sclera are clear. TM's are normal bilaterally. OP--White spot on right upper tonsil. This did not dislodge after gargling (appeared slightly mucoid, similar to nasal drainage). Tonsils aren't erythematous or significantly enlarged (mild).   Nasal mucosa is mildly edematous with white mucus on the right.  Sinuses are nontender Neck: mildly tender lymphadenopathy on the right Heart: regular rate and rhythm Lungs: clear bilaterally Extremities: no edema Skin: normal turgor, no rash Psych: normal mood, affect, hygiene and grooming  Rapid strep negative  ASSESSMENT/PLAN:  Sore throat - suspect viral, given exposure and URI symptoms. However she had focal abnormality on R tonsil, with R sided pain, adenopathy; start ABX in 1-2d if not improving - Plan: Rapid Strep A, amoxicillin (AMOXIL) 875 MG tablet  Upper respiratory tract infection, unspecified type - suspect virus, given exposures.    Supportive measures reviewed. Start ABX in 1-2d if sore throat not improving--strep was negative, but focal white area and right sided lymphadenopathy (but also R sided sinus symptoms and likely drainage).  Drink plenty of fluids. Use tylenol and/or ibuprofen as needed for pain or fever.  Continue guaifenesin to help keep the mucus thin. Using a decongestant may help with the nasal congestion, and lessen the amount draining down the throat, which is contributing to throat pain (and may ultimately lead to some cough).   Chloraseptic spray and salt water gargles can help with pain.  If your fever persists and throat pain persists after another 24-48 hours (getting worse  rather than better), then start the antibiotic.

## 2016-10-28 ENCOUNTER — Encounter: Payer: Self-pay | Admitting: Family Medicine

## 2016-11-17 NOTE — Progress Notes (Signed)
Chief Complaint  Patient presents with  . Annual Exam    nonfasting annual exam no GYN exam-sees Dr.Gehrig. Did not do eye exam, sees Donivan Scull. Would like to talk to you about nervousness and stress level.     Kristie Howell is a 66 y.o. female who presents for a complete physical.  She would like to discuss anxiety and insomnia today.  Insomnia: She still requires 2.50m of ambien about 2x/week or less. She has trouble falling asleep. Melatonin and various teas, and other OTC meds weren't helpful. On the nights she doesn't take this, she is quite restless, has trouble sleeping, but is afraid to take it daily, afraid of getting addicted to it.  Anxiety: Her anxiety was much worse related to husband's dx of recurrent thyroid cancer. He is doing well now, but she reports ongoing anxiety, constant worry, some irritability.  Last year she was given alprazolam to use prn (such as with plane trips). She admits she hardly ever uses this--still has 2 pills left.  She feels like she is anxious all the time, not just sporadically where she needs to take something only prn. The last couple of years have been "kind of rough". Some days she feels down, seems to be very sporadic.  She recalls having issues throughout her life with mild depression, even as a child.  She saw a psychiatrist once about 5 years ago, and was prescribed Prozac, which she recalls helped.  She feels like she now needs to take something again (even though she doesn't like taking meds daily). She recalls going to a therapist a couple of times--explained how the brain works, but didn't really help, so didn't go back.  Recalls also being very depressed about 6 months after her hysterectomy, after losing a friend. Dr. GAlycia Rossettigave her a medication which she felt like made her much more depressed, even suicidal.   Chart reviewed--Effexor given 03/2012 (lists indication as hot flashes). She thinks this could possibly be the medication--she has the  bottle still at home and will check it. She doesn't want to take the same medication again.  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 There was microinvasion on the left side. She last saw Dr. GAlycia Rossettiin January, and had pap smear and CA 125.  She has one last visit scheduled with her next month, and if normal, will likely be released from her care (5 years later). Office visit reviewed--breast exam was NOT performed at her January visit (or every by the GYN-onc). She no longer sees Dr. GCherylann Banas(who retired).   Immunization History  Administered Date(s) Administered  . Influenza Split 05/05/2013  . Influenza,inj,Quad PF,36+ Mos 05/16/2014  . Tdap 03/23/2007  . Zoster 03/28/2013   Flu shot--got at her husband's office Last Pap smear: yearly, 07/2016 through Dr. GAlycia RossettiLast mammogram: 05/2013 (knows she is past due, too anxious to get) Last colonoscopy:03/2016, due again 2022  (Dr. MWatt Climes due to family history Last DEXA: never Ophtho: yearly  Dentist: every 6 months Exercise:Usual routine is walking about 4 days/week, trying to walk for about 45-618ms. Less active since having a new grandbaby, helping her daughter. Carries grandchildren. No other weight-bearing exercise. Age of menopause was [age]no family h/o osteoporosis  Past Medical History:  Diagnosis Date  . Abnormal TSH    borderline; yearly checks  . Anxiety 12-04-11   prone to panic attacks  if stressed, heart rate increases  . Diverticulosis 02/2011  . Hemorrhoids    internal/external seen on colonoscopy 02/2011  . Hyperplastic colon polyp 02/2011   Dr. Watt Climes q17yr(+FHx)  . Insomnia 2012  . Ovarian tumor of borderline malignancy   . PMB (postmenopausal bleeding)   . PONV (postoperative nausea and vomiting)     Past Surgical History:  Procedure Laterality Date  . ABDOMINAL HYSTERECTOMY  11/2011   Total robotic  hysterectomy bilateral salpingo-oophorectomy, left pelvic lymph node dissection and left para-aortic lymph node dissection, omental biopsy, peritoneal biopsy  [place]  . FINGER SURGERY     left middle finger (same as above)  . LYMPH NODE DISSECTION  12/10/2011   Procedure: LYMPH NODE DISSECTION;  Surgeon: PImagene GurneyA. GAlycia Rossetti MD;  Location: [facility];  Service: Gynecology;  Laterality: N/A;  Possible Lymph Nodes  . MASS EXCISION  06/26/2012   Procedure: MINOR EXCISION OF MASS;  Surgeon: RCammie Sickle, MD;  [facility]  Service: Orthopedics;  Laterality: Left;  mucoid cyst left long finger     Social History   Social History  . Marital status: Married    Spouse name: N/A  . Number of children: 2  . 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 Smoking status: Never Smoker  . Smokeless tobacco: Never Used  . Alcohol use 1.2 oz/week    2 Standard drinks or equivalent per week     Comment: 1-2 drinks per week.  . Drug use: No  . Sexual activity: Yes    Partners: Male    Birth control/ protection: Post-menopausal   Other Topics Concern  . Not on file   Social History Narrative   [place]ives with husband.  [place] 2 children--3 granddaughters    Family History  Problem Relation Age of Onset  . Thyroid disease Mother   . Melanoma Mother   . Diabetes Mother     borderline (in her 966's  . Stroke Mother 971 . Hypertension Mother   . Cancer Father 643   Colon cancer  . Ovarian cancer Paternal Grandmother   . Heart attack Maternal Grandmother   . Breast cancer Neg Hx     Outpatient Encounter Prescriptions as of 11/18/2016  Medication Sig Note  . TURMERIC PO Take 2 tablets by mouth 2 (two) times daily.   .Marland Kitchenzolpidem (AMBIEN) 5 MG tablet Take 0.5-1 tablets (2.5-5 mg total) by mouth at bedtime as needed for sleep.   .  [DISCONTINUED] zolpidem (AMBIEN) 5 MG tablet Take 1 tablet by mouth as needed for sleep 11/18/2016: Uses 1/2 tablet approx 2x/week  . ALPRAZolam (XANAX) 0.25 MG tablet Take 1 tablet (0.25 mg total) by mouth 3 (three) times daily as needed for anxiety.   .Marland Kitchenescitalopram (LEXAPRO) 5 MG tablet Start 1/2 tablet daily, increase to tablet after 1 week if tolerated.  Increase gradually to 2 tablets if needed   . Multiple Vitamin (MULTI VITAMIN PO) Take 1 tablet by mouth daily. 11/18/2016: Ran out at least 6 months ago  . Omega-3 Fatty Acids (OMEGA-3 FISH OIL PO) Take by mouth.   . [DISCONTINUED] ALPRAZolam (XANAX) 0.25 MG tablet Take 1 tablet (0.25 mg total) by mouth 3 (three) times daily as needed for anxiety. (Patient not taking: Reported on 11/18/2016) 11/18/2016: Rarely  uses  . [DISCONTINUED] amoxicillin (AMOXIL) 875 MG tablet Take 1 tablet (875 mg total) by mouth 2 (two) times daily.    No facility-administered encounter medications on file as of 11/18/2016.     Allergies  Allergen Reactions  . Sulfa Antibiotics Swelling and Rash  . Zithromax [Azithromycin] Other (See Comments)    Abdominal pain/cramping    ROS: The patient denies anorexia, fever, weight changes (slight gain since less active), headaches, vision changes, decreased hearing, ear pain, sore throat, breast concerns, chest pain, palpitations, dizziness, syncope, dyspnea on exertion, cough, swelling, nausea, vomiting, diarrhea, constipation, abdominal pain, melena, hematochezia, indigestion/heartburn, hematuria, incontinence, dysuria, vaginal bleeding, discharge, odor or itch, genital lesions, joint pains (mild finger arthritis, unchanged); no numbness, tingling, weakness, tremor, suspicious skin lesions, abnormal bleeding/bruising, or enlarged lymph nodes. Intermittent, mild palpitations (chronic), mostly at night, when anxious. Some arthritis developing in her fingers--noticing some deformity at her knuckles.  Some stiffness and swelling in the  mornings. Some easy bruising when she takes advil. +anxiety/depression/insomnia per HPI   PHYSICAL EXAM:  BP 118/72 (BP Location: Left Arm, Patient Position: Sitting, Cuff Size: Normal)   Pulse 88   Ht 5' 5.5" (1.664 m)   Wt 133 lb (60.3 kg)   BMI 21.80 kg/m   Wt Readings from Last 3 Encounters:  11/18/16 133 lb (60.3 kg)  09/30/16 131 lb 3.2 oz (59.5 kg)  08/07/16 130 lb (59 kg)    General Appearance:   Alert, cooperative, no distress, appears stated age  Head:   Normocephalic, without obvious abnormality, atraumatic  Eyes:   PERRL, conjunctiva/corneas clear, EOM's intact, fundi benign  Ears:   Normal TM's and external ear canals  Nose:  Nares normal, mucosa normal, no drainage or sinus tenderness  Throat:  Lips, mucosa, and tongue normal; teeth and gums normal  Neck:  Supple, no lymphadenopathy; thyroid: no enlargement/tenderness/nodules; no carotid bruit or JVD  Back:  Spine nontender, no curvature, ROM normal, no CVA tenderness.  Lungs:   Clear to auscultation bilaterally without wheezes, rales or ronchi; respirations unlabored  Chest Wall:   No tenderness or deformity  Heart:   Regular rate and rhythm, S1 and S2 normal, no murmur, rub  or gallop  Breast Exam:   No nipple inversion, discharge, skin dimpling or masses.  No axillary lymphadenopathy  Abdomen:   Soft, non-tender, nondistended, normoactive bowel sounds, no masses, no hepatosplenomegaly  Genitalia:   Deferred to GYN     Extremities:  No clubbing, cyanosis or edema. Left 2nd and third fingers have bony hypertrophy at the DIP joints.   Pulses:  2+ and symmetric all extremities  Skin:  Skin color, texture, turgor normal, no rashes or lesions.  Lymph nodes:  Cervical, supraclavicular, and axillary nodes normal  Neurologic:  CNII-XII intact, normal strength, sensation and gait; reflexes 2+ and symmetric throughout   Psych: Normal mood, affect, hygiene and grooming   GAD score 10--see screen in epic.   ASSESSMENT/PLAN:  Annual physical exam - Plan: POCT Urinalysis Dipstick, CBC with Differential/Platelet, Lipid panel, VITAMIN D 25 Hydroxy (Vit-D Deficiency, Fractures), TSH, Comprehensive metabolic panel  Generalized anxiety disorder - start lexapro slowly, alprazolam prn panic attacks and prn anxiety. Risks/side effects reviewed of both meds. Counseling encouraged - Plan: ALPRAZolam (XANAX) 0.25 MG tablet, escitalopram (LEXAPRO) 5 MG tablet  Insomnia, unspecified type - anxiety may contribute. Discussed zolpidem--low dose is very effective. While not FDA approved for long-term use, likely fine to use daily if needed - Plan: zolpidem (AMBIEN) 5  MG tablet  Other fatigue - Plan: CBC with Differential/Platelet, VITAMIN D 25 Hydroxy (Vit-D Deficiency, Fractures), TSH, Comprehensive metabolic panel  Immunization due - Plan: Tdap vaccine greater than or equal to 7yo IM  Endometrial cancer ([place]) - and borderline ovarian tumor with elevated CA-125; has f/u with GYN-ONC in June, and if normal, will be released from her care.   TdaP (instead of Td, given two newborn granddaughters she is around).  shingrix recommended--to check insurance coverage.  Risks/side effects reviewed.  Discussed risks/side effects of zolpidem, alprazolam, lexapro.  May use zolpidem more frequently than she is, if needed. Using only low dose.  May use daily if needed, vs prn. Discussed risks, FDA approval. She previously tried the CR and didn't feel it worked as well.  Discussed monthly self breast exams and yearly mammograms (past due--risks/reasons for getting reviewed in detail and strongly encouraged her to schedule 3D mammo, even if she needs husband to drive her and take alprazolam prior); at least 30 minutes of aerobic activity at least 5 days/week, weight-bearing exercise at least 2x/week; proper  sunscreen use reviewed; healthy diet, including goals of calcium and vitamin D intake and alcohol recommendations (less than or equal to 1 drink/day) reviewed; regular seatbelt use; changing batteries in smoke detectors. Immunization recommendations discussed--TdaP today. Shingrix recommended. Pneumonia vaccine (prevnar-13) at age 49; continue yearly flu shots. Colonoscopy recommendations reviewed--due again 03/2021 DEXA age 56.  Off MVI x 6-9 months. Wants D checked  CBC, D, TSH, c-met, lipid    We are going to start escitalopram (lexapro) very slowly, given that you seem sensitive to medications, and only require low doses.  We will start with the 71m tablet.  Take 1/2 tablet every morning (switch if you prefer to take it at bedtime) for a week.  After a week, if you aren't having any side effects, increase to the full tablet.  After another 2 weeks, if you don't notice any significant benefit, and no side effects, then increase to 2 tablets (135m-feel free to do this slower, such as 1.5 tablets daily prior to increasing to the full 2 tablets, especially if you are having some side effects with each dose change).  Side effects include headache, nausea, increased anxiety, stomach upset.  These should be temporarily. Switch to evening dosing if it makes you tired.  Remember there may also be some sexual side effects. Consider getting counseling again, to help with anxiety and relaxation, especially at the early part of the medication, when it isn't effective yet.  JuMarya Amslert LeSLM Corporationis someone I have worked closely with for years.  Her partners are also very good.  Please schedule your 3D mammogram.  Feel free to have your husband drive you and take an alprazolam prior, if your anxiety is very bad.

## 2016-11-18 ENCOUNTER — Ambulatory Visit (INDEPENDENT_AMBULATORY_CARE_PROVIDER_SITE_OTHER): Payer: 59 | Admitting: Family Medicine

## 2016-11-18 ENCOUNTER — Encounter: Payer: Self-pay | Admitting: Family Medicine

## 2016-11-18 VITALS — BP 118/72 | HR 88 | Ht 65.5 in | Wt 133.0 lb

## 2016-11-18 DIAGNOSIS — Z0000 Encounter for general adult medical examination without abnormal findings: Secondary | ICD-10-CM

## 2016-11-18 DIAGNOSIS — C541 Malignant neoplasm of endometrium: Secondary | ICD-10-CM | POA: Diagnosis not present

## 2016-11-18 DIAGNOSIS — F411 Generalized anxiety disorder: Secondary | ICD-10-CM | POA: Diagnosis not present

## 2016-11-18 DIAGNOSIS — G4700 Insomnia, unspecified: Secondary | ICD-10-CM | POA: Diagnosis not present

## 2016-11-18 DIAGNOSIS — R5383 Other fatigue: Secondary | ICD-10-CM | POA: Diagnosis not present

## 2016-11-18 DIAGNOSIS — Z23 Encounter for immunization: Secondary | ICD-10-CM

## 2016-11-18 LAB — POCT URINALYSIS DIPSTICK
Bilirubin, UA: NEGATIVE
Glucose, UA: NEGATIVE
Ketones, UA: NEGATIVE
Leukocytes, UA: NEGATIVE
NITRITE UA: NEGATIVE
PROTEIN UA: NEGATIVE
SPEC GRAV UA: 1.02 (ref 1.010–1.025)
UROBILINOGEN UA: NEGATIVE U/dL — AB
pH, UA: 6 (ref 5.0–8.0)

## 2016-11-18 MED ORDER — ESCITALOPRAM OXALATE 5 MG PO TABS: ORAL_TABLET | ORAL | 1 refills | Status: DC

## 2016-11-18 MED ORDER — ALPRAZOLAM 0.25 MG PO TABS: 0.2500 mg | ORAL_TABLET | Freq: Three times a day (TID) | ORAL | 0 refills | Status: DC | PRN

## 2016-11-18 MED ORDER — ZOLPIDEM TARTRATE 5 MG PO TABS: 2.5000 mg | ORAL_TABLET | Freq: Every evening | ORAL | 2 refills | Status: DC | PRN

## 2016-11-18 NOTE — Patient Instructions (Addendum)
  HEALTH MAINTENANCE RECOMMENDATIONS:  It is recommended that you get at least 30 minutes of aerobic exercise at least 5 days/week (for weight loss, you may need as much as 60-90 minutes). This can be any activity that gets your heart rate up. This can be divided in 10-15 minute intervals if needed, but try and build up your endurance at least once a week.  Weight bearing exercise is also recommended twice weekly.  Eat a healthy diet with lots of vegetables, fruits and fiber.  "Colorful" foods have a lot of vitamins (ie green vegetables, tomatoes, red peppers, etc).  Limit sweet tea, regular sodas and alcoholic beverages, all of which has a lot of calories and sugar.  Up to 1 alcoholic drink daily may be beneficial for women (unless trying to lose weight, watch sugars).  Drink a lot of water.  Calcium recommendations are 1200-1500 mg daily (1500 mg for postmenopausal women or women without ovaries), and vitamin D 1000 IU daily.  This should be obtained from diet and/or supplements (vitamins), and calcium should not be taken all at once, but in divided doses.  Monthly self breast exams and yearly mammograms for women over the age of 66 is recommended.  Sunscreen of at least SPF 30 should be used on all sun-exposed parts of the skin when outside between the hours of 10 am and 4 pm (not just when at beach or pool, but even with exercise, golf, tennis, and yard work!)  Use a sunscreen that says "broad spectrum" so it covers both UVA and UVB rays, and make sure to reapply every 1-2 hours.  Remember to change the batteries in your smoke detectors when changing your clock times in the spring and fall.  Use your seat belt every time you are in a car, and please drive safely and not be distracted with cell phones and texting while driving.  I recommend getting the new shingles vaccine (Shingrix). You will need to check with your insurance to see if it is covered.  It is a series of 2 injections, spaced 2  months apart.  We are going to start escitalopram (lexapro) very slowly, given that you seem sensitive to medications, and only require low doses.  We will start with the 5mg  tablet.  Take 1/2 tablet every morning (switch if you prefer to take it at bedtime) for a week.  After a week, if you aren't having any side effects, increase to the full tablet.  After another 2 weeks, if you don't notice any significant benefit, and no side effects, then increase to 2 tablets (10mg --feel free to do this slower, such as 1.5 tablets daily prior to increasing to the full 2 tablets, especially if you are having some side effects with each dose change).  Side effects include headache, nausea, increased anxiety, stomach upset.  These should be temporarily. Switch to evening dosing if it makes you tired.  Remember there may also be some sexual side effects. Consider getting counseling again, to help with anxiety and relaxation, especially at the early part of the medication, when it isn't effective yet.  Marya Amsler at [employer]) is someone I have worked closely with for years.  Her partners are also very good.  Please schedule your 3D mammogram.  Feel free to have your husband drive you and take an alprazolam prior, if your anxiety is very bad.  We gave you a Tdap (tetanus and pertussis booster) today.

## 2017-01-01 ENCOUNTER — Encounter: Payer: 59 | Admitting: Family Medicine

## 2017-01-08 ENCOUNTER — Other Ambulatory Visit: Payer: Self-pay | Admitting: Gynecologic Oncology

## 2017-01-08 ENCOUNTER — Other Ambulatory Visit (HOSPITAL_BASED_OUTPATIENT_CLINIC_OR_DEPARTMENT_OTHER): Payer: 59

## 2017-01-08 ENCOUNTER — Ambulatory Visit: Payer: 59 | Attending: Gynecologic Oncology | Admitting: Gynecologic Oncology

## 2017-01-08 ENCOUNTER — Encounter: Payer: Self-pay | Admitting: Gynecologic Oncology

## 2017-01-08 VITALS — BP 132/75 | HR 108 | Temp 97.9°F | Resp 20 | Wt 131.2 lb

## 2017-01-08 DIAGNOSIS — F419 Anxiety disorder, unspecified: Secondary | ICD-10-CM | POA: Insufficient documentation

## 2017-01-08 DIAGNOSIS — C569 Malignant neoplasm of unspecified ovary: Secondary | ICD-10-CM | POA: Insufficient documentation

## 2017-01-08 DIAGNOSIS — Z8041 Family history of malignant neoplasm of ovary: Secondary | ICD-10-CM | POA: Insufficient documentation

## 2017-01-08 DIAGNOSIS — Z8542 Personal history of malignant neoplasm of other parts of uterus: Secondary | ICD-10-CM

## 2017-01-08 DIAGNOSIS — Z9889 Other specified postprocedural states: Secondary | ICD-10-CM | POA: Diagnosis not present

## 2017-01-08 DIAGNOSIS — D3910 Neoplasm of uncertain behavior of unspecified ovary: Secondary | ICD-10-CM

## 2017-01-08 DIAGNOSIS — Z90710 Acquired absence of both cervix and uterus: Secondary | ICD-10-CM | POA: Diagnosis not present

## 2017-01-08 DIAGNOSIS — Z800 Family history of malignant neoplasm of digestive organs: Secondary | ICD-10-CM | POA: Diagnosis not present

## 2017-01-08 DIAGNOSIS — Z833 Family history of diabetes mellitus: Secondary | ICD-10-CM | POA: Diagnosis not present

## 2017-01-08 DIAGNOSIS — C541 Malignant neoplasm of endometrium: Secondary | ICD-10-CM | POA: Diagnosis not present

## 2017-01-08 DIAGNOSIS — Z8603 Personal history of neoplasm of uncertain behavior: Secondary | ICD-10-CM | POA: Diagnosis not present

## 2017-01-08 DIAGNOSIS — Z79899 Other long term (current) drug therapy: Secondary | ICD-10-CM | POA: Insufficient documentation

## 2017-01-08 DIAGNOSIS — G4700 Insomnia, unspecified: Secondary | ICD-10-CM | POA: Diagnosis not present

## 2017-01-08 DIAGNOSIS — Z823 Family history of stroke: Secondary | ICD-10-CM | POA: Insufficient documentation

## 2017-01-08 DIAGNOSIS — Z8249 Family history of ischemic heart disease and other diseases of the circulatory system: Secondary | ICD-10-CM | POA: Diagnosis not present

## 2017-01-08 DIAGNOSIS — Z803 Family history of malignant neoplasm of breast: Secondary | ICD-10-CM | POA: Insufficient documentation

## 2017-01-08 DIAGNOSIS — Z888 Allergy status to other drugs, medicaments and biological substances status: Secondary | ICD-10-CM | POA: Diagnosis not present

## 2017-01-08 NOTE — Progress Notes (Signed)
Consult Note: Gyn-Onc  Kristie Howell 66 y.o. female  CC:  Chief Complaint  Patient presents with  . Ovarian Cancer  . Endometrial Cancer    HPI: Patient is a very pleasant 66 year old gravida 2 para 2 was menopause about 2-1/2 years ago. She was on a combination hormone replacement therapy consisting of Vivelle-Dot we daily Prometrium. She states that this past summer she started having some vaginal bleeding and reported this to her previous gynecologist who performed and after sound that was unremarkable. She was reassured and or supplements to 4 months. She had recurrent bleeding in January of this year reported this to your gynecologist has similar performed a repeat ultrasound that was reassuring and was recommended that she change her hormone replacement therapy. She was uncomfortable with this and she went to see Dr. Cherylann Banas. He performed an ultrasound in saline effusion hysterogram they reveal a retroverted sac septated uterus with a right endometrial cavity measuring 4.5 mm in the left endometrial cavity measuring 5 mm. A biopsy was performed it revealed complex atypical hyperplasia and focal endometrioid adenocarcinoma grade 1.   She underwent a total robotic hysterectomy bilateral salpingo-oophorectomy left pelvic lymph node dissection) clinical dissection omental biopsy and peritoneal biopsies on 12/10/2011. Operative findings included a globular uterus which was retroflexed secondary to adhesions in the cul-de-sac. It was a 4 cm complex ovarian mass on the left side with significant amount of surface excrescence. Frozen section was consistent with at least a low malignant potential tumor. There was no tumor within the uterus on frozen section. She had a normal appearing appendix, no peritoneal disease, and no significant adenopathy.   Final pathology within the uterus revealed a grade 1 endometrioid adenocarcinoma superficially invading into the underlying myometrium 0.1 cm out of 2.0  cm. There is no evidence of any lymphovascular space involvement. There were fibroids. The cervix was negative. Bilateral fallopian tubes were negative. Bilateral tumors were noted on the adnexa. The left ovary had a serous borderline tumor with microinvasion. The right ovary had a serous borderline tumor. Zero out of two left pelvic, 0/5 left para-aortic nodes were negative. There is no tumor in the omentum or any of the peritoneal based biopsies. Her CA 125 was checked immediately postoperatively was elevated at 191.9. Final [place] staging included a stage IA grade 1 endometrioid adenocarcinoma and a stage IC ovarian borderline tumor. There was microinvasion of the left side. The right side had a borderline tumor with no microinvasion.   I last saw her in 07/2016 at which time her exam and pap smear were negative. Her CA-125 values have been normal (15).   Interval History:  She is overall doing very well. She has a granddaughter Kristie Howell is about 34/64 years old. She has a three-month-old granddaughter and another 25-month-old granddaughter. The children are doing very well. They live relatively nearby in [place] and [place] so she gets to see them frequently. She states that she'll be due for a mammogram soon. There are no new medical problems and her family. She is up-to-date on her colonoscopy.  Review of Systems: Constitutional:  Denies fever. Cardiovascular: No chest pain, shortness of breath, or edema  Pulmonary: No cough Gastro Intestinal:   No nausea, vomiting, constipation, or diarrhea reported. No change in bowel movement. No early satiety, no bloating Genitourinary: Denies vaginal bleeding and discharge. Positive vaginal dryness Musculoskeletal:+ arthritis hands Psychology: Some insomnia  Current Meds:  Outpatient Encounter Prescriptions as of 01/08/2017  Medication Sig  . ALPRAZolam Duanne Moron)  0.25 MG tablet Take 1 tablet (0.25 mg total) by mouth 3 (three) times daily as needed for  anxiety.  Marland Kitchen escitalopram (LEXAPRO) 5 MG tablet Start 1/2 tablet daily, increase to tablet after 1 week if tolerated.  Increase gradually to 2 tablets if needed  . Multiple Vitamin (MULTI VITAMIN PO) Take 1 tablet by mouth daily.  . Omega-3 Fatty Acids (OMEGA-3 FISH OIL PO) Take by mouth.  . TURMERIC PO Take 2 tablets by mouth 2 (two) times daily.  Marland Kitchen zolpidem (AMBIEN) 5 MG tablet Take 0.5-1 tablets (2.5-5 mg total) by mouth at bedtime as needed for sleep.   No facility-administered encounter medications on file as of 01/08/2017.     Allergy:  Allergies  Allergen Reactions  . Sulfa Antibiotics Swelling and Rash  . Zithromax [Azithromycin] Other (See Comments)    Abdominal pain/cramping    Social Hx:   Social History   Social History  . Marital status: Married    Spouse name: N/A  . Number of children: 2  . 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 Smoking status: Never Smoker  . Smokeless tobacco: Never Used  . Alcohol use 1.2 oz/week    2 Standard drinks or equivalent per week     Comment: 1-2 drinks per week.  . Drug use: No  . Sexual activity: Yes    Partners: Male    Birth control/ protection: Post-menopausal   Other Topics Concern  . Not on file   Social History Narrative   Lives with husband.  Son in [place] with 1 daughter, daughter in [place], [place]. Has 2 children--3 granddaughters    Past Surgical Hx:  Past Surgical History:  Procedure Laterality Date  . ABDOMINAL HYSTERECTOMY  11/2011   Total robotic hysterectomy bilateral salpingo-oophorectomy, left pelvic lymph node dissection and left para-aortic lymph node dissection, omental biopsy, peritoneal biopsy  . [place]   x2  . FINGER SURGERY     left middle finger (same as above)  . LYMPH NODE DISSECTION  12/10/2011   Procedure: LYMPH NODE DISSECTION;  Surgeon: Imagene Gurney A. Alycia Rossetti, MD;  Location: [facility];  Service: Gynecology;   Laterality: N/A;  Possible Lymph Nodes  . MASS EXCISION  06/26/2012   Procedure: MINOR EXCISION OF MASS;  Surgeon: Cammie Sickle., MD;  Location: [facility];  Service: Orthopedics;  Laterality: Left;  mucoid cyst left long finger     Past Medical Hx:  Past Medical History:  Diagnosis Date  . Abnormal TSH    borderline; yearly checks  . Anxiety 12-04-11   prone to panic attacks if stressed, heart rate increases  . Diverticulosis 02/2011  . Hemorrhoids    internal/external seen on colonoscopy 02/2011  . Hyperplastic colon polyp 02/2011   Dr. Watt Climes q8yr (+FHx)  . Insomnia 2012  . Ovarian tumor of borderline malignancy   . PMB (postmenopausal bleeding)   . PONV (postoperative nausea and vomiting)     Family Hx:  Family History  Problem Relation Age of Onset  . Thyroid disease Mother   . Melanoma Mother   . Diabetes Mother        borderline (in her 45's)  . Stroke Mother 19  . Hypertension Mother   . Cancer Father 75       Colon cancer  . Ovarian cancer Paternal Grandmother   . Heart attack Maternal Grandmother   .  Breast cancer Neg Hx     Vitals:  Blood pressure 132/75, pulse (!) 108, temperature 97.9 F (36.6 C), temperature source Oral, resp. rate 20, weight 131 lb 3.2 oz (59.5 kg).  Physical Exam: Well-nourished well-developed female in no acute distress.  Neck: Supple, no lymphadenopathy, no thyromegaly.  Lungs: Clear to auscultation bilaterally.  Cardiovascular: Regular rate and rhythm.  Abdomen: Well-healed surgical incisions. Abdomen is soft, nontender, nondistended. There iareno palpable masses. There is no hepatosplenomegaly. There are no incisional hernias.  Groins: No lymphadenopathy.  Extremities: No edema.  Pelvic: Normal external female genitalia. Vagina is atrophic. There are no visible lesions. Bimanual examination reveals no masses or nodularity. Rectal confirms no nodularity.   Assessment/Plan: 66 year old with this clinical  stage IA 1 endometrioid adenocarcinoma who also has a stage IC ovarian borderline tumor with elevated CA 125 diagnosed and treated in May 2013. She is at her 5 year anniversary mark. There was microinvasion on the left side. We will followup in results of her CA 125 from today.  She will follow-up with her primary physician Dr. Tomi Bamberger. She will be released from our clinic. She knows that we'll be happy to see her in the future should the need arise.      Monea Pesantez A., MD 01/08/2017, 9:29 AM

## 2017-01-08 NOTE — Patient Instructions (Signed)
Congratulations on your 5 year anniversary. This is wonderful! Please follow-up with your other providers and return to see [place] as necessary.

## 2017-01-09 ENCOUNTER — Telehealth: Payer: Self-pay

## 2017-01-09 LAB — CA 125: CANCER ANTIGEN (CA) 125: 19.2 U/mL (ref 0.0–38.1)

## 2017-01-09 NOTE — Telephone Encounter (Signed)
LM stating that her CA-125 was in normal range at 19.2 per Joylene John, NP. She can call back to the office if she has any questions or concerns.

## 2017-01-27 ENCOUNTER — Other Ambulatory Visit: Payer: Self-pay | Admitting: Family Medicine

## 2017-01-27 DIAGNOSIS — F411 Generalized anxiety disorder: Secondary | ICD-10-CM

## 2017-01-29 ENCOUNTER — Other Ambulatory Visit: Payer: 59

## 2017-01-29 DIAGNOSIS — R5383 Other fatigue: Secondary | ICD-10-CM

## 2017-01-29 DIAGNOSIS — Z0000 Encounter for general adult medical examination without abnormal findings: Secondary | ICD-10-CM

## 2017-01-29 LAB — CBC WITH DIFFERENTIAL/PLATELET
BASOS PCT: 1 %
Basophils Absolute: 43 cells/uL (ref 0–200)
EOS PCT: 4 %
Eosinophils Absolute: 172 cells/uL (ref 15–500)
HCT: 43.5 % (ref 35.0–45.0)
Hemoglobin: 14.8 g/dL (ref 11.7–15.5)
LYMPHS PCT: 32 %
Lymphs Abs: 1376 cells/uL (ref 850–3900)
MCH: 30.3 pg (ref 27.0–33.0)
MCHC: 34 g/dL (ref 32.0–36.0)
MCV: 89 fL (ref 80.0–100.0)
MPV: 10.2 fL (ref 7.5–12.5)
Monocytes Absolute: 258 cells/uL (ref 200–950)
Monocytes Relative: 6 %
NEUTROS ABS: 2451 {cells}/uL (ref 1500–7800)
Neutrophils Relative %: 57 %
PLATELETS: 174 10*3/uL (ref 140–400)
RBC: 4.89 MIL/uL (ref 3.80–5.10)
RDW: 13.7 % (ref 11.0–15.0)
WBC: 4.3 10*3/uL (ref 4.0–10.5)

## 2017-01-29 LAB — COMPREHENSIVE METABOLIC PANEL
ALK PHOS: 85 U/L (ref 33–130)
ALT: 22 U/L (ref 6–29)
AST: 25 U/L (ref 10–35)
Albumin: 4.2 g/dL (ref 3.6–5.1)
BUN: 12 mg/dL (ref 7–25)
CALCIUM: 9.1 mg/dL (ref 8.6–10.4)
CHLORIDE: 104 mmol/L (ref 98–110)
CO2: 25 mmol/L (ref 20–31)
Creat: 0.77 mg/dL (ref 0.50–0.99)
Glucose, Bld: 86 mg/dL (ref 65–99)
POTASSIUM: 4 mmol/L (ref 3.5–5.3)
Sodium: 140 mmol/L (ref 135–146)
TOTAL PROTEIN: 6.7 g/dL (ref 6.1–8.1)
Total Bilirubin: 0.7 mg/dL (ref 0.2–1.2)

## 2017-01-29 LAB — LIPID PANEL
CHOLESTEROL: 227 mg/dL — AB (ref <200)
HDL: 79 mg/dL (ref >50)
LDL Cholesterol: 133 mg/dL — ABNORMAL HIGH (ref <100)
Total CHOL/HDL Ratio: 2.9 Ratio (ref <5.0)
Triglycerides: 74 mg/dL (ref <150)
VLDL: 15 mg/dL (ref <30)

## 2017-01-29 LAB — TSH: TSH: 2.6 m[IU]/L

## 2017-01-30 LAB — VITAMIN D 25 HYDROXY (VIT D DEFICIENCY, FRACTURES): VIT D 25 HYDROXY: 33 ng/mL (ref 30–100)

## 2017-02-02 NOTE — Progress Notes (Signed)
Chief Complaint  Patient presents with  . 6 week med check    6 week med check. dull headache- and having jaw issues but going to dentist tomorrow and didn't know if HA is related to jaw issues    Patient presents to follow up on anxiety and insomnia. She was started on low dose lexapro,with alprazolam to use prn for panic attacks.  She was also prescribed ambien to use prn for insomnia at her last visit in May. She was encouraged to get counseling.  She has been taking 5mg  lexapro (started at 2.5 and increased after a week).  She finds that she overreacts less, but thinks she isn't seeing the full effect of the medication, thinking she needs a higher dose.  She is hesitant to increase the dose, because she has gained some weight and worries it is from the medication.  Denies any GI side effects. She does admit that she has been traveling a lot this summer, and just got back from a family reunion where she ate more than normal.  She thinks the medications is helping enough that she doesn't need counseling.  She will consider this. She filled the alprazolam, hasn't taken any.  She has been needing to take the [place] more for sleep; it is effective. No weird behavior, just some weird dreams. She cannot tell if the medication (which she takes at night) is contributing to the poor sleep, or if it is the same as before.  She is complaining of a dull headache across her forehead and temples. She is wondering if it is related to her jaw problems. She is scheduled to see dentist tomorrow. Jaw is popping, thinks she is clenching her teeth.  She has been away a lot this summer. Hasn't scheduled mammo yet but plans to. (we had discussed taking alprazolam prior and having her husband drive her).  PMH, PSH, SH reviewed  Outpatient Encounter Prescriptions as of 02/03/2017  Medication Sig Note  . escitalopram (LEXAPRO) 10 MG tablet Take 1 tablet (10 mg total) by mouth daily.   Marland Kitchen zolpidem (AMBIEN) 5 MG  tablet Take 0.5-1 tablets (2.5-5 mg total) by mouth at bedtime as needed for sleep.   . [DISCONTINUED] escitalopram (LEXAPRO) 5 MG tablet TAKE ONE-HALF TABLET DAILY-INCREASE TO 1TABLET AFTER 1 WEEK IF TOLERATED- INCREASE GRADUALLY TO 2 TABLETS IF NEEDED (Patient taking differently: 1 TABLET daily. INCREASE GRADUALLY TO 2 TABLETS IF NEEDED) 02/03/2017: Taking 1 tablet daily  . ALPRAZolam (XANAX) 0.25 MG tablet Take 1 tablet (0.25 mg total) by mouth 3 (three) times daily as needed for anxiety. (Patient not taking: Reported on 02/03/2017)   . Multiple Vitamin (MULTI VITAMIN PO) Take 1 tablet by mouth daily.   . Omega-3 Fatty Acids (OMEGA-3 FISH OIL PO) Take by mouth.   . TURMERIC PO Take 2 tablets by mouth 2 (two) times daily.    No facility-administered encounter medications on file as of 02/03/2017.    Allergies  Allergen Reactions  . Sulfa Antibiotics Swelling and Rash  . Zithromax [Azithromycin] Other (See Comments)    Abdominal pain/cramping   ROS: no fever, chills, URI symptoms. +headaches per HPI, no dizziness, chest pain. She reports still seeing a white spot on the tonsils, but denies sore throat, PND, cough.  No nausea, vomiting, bowel changes.  +weight gain, insomnia and anxiety per HPI. No bleeding, bruising, rashes or other concerns. See HPI.  PHYSICAL EXAM:  BP 102/68   Pulse 62   Ht 5' 5.5" (1.664 m)  Wt 135 lb 3.2 oz (61.3 kg)   BMI 22.16 kg/m   Wt Readings from Last 3 Encounters:  02/03/17 135 lb 3.2 oz (61.3 kg)  01/08/17 131 lb 3.2 oz (59.5 kg)  11/18/16 133 lb (60.3 kg)   Well appearing, pleasant female in no distress HEENT: PERRL EOMI, conjunctiva and sclera are clear. Nasal mucosa is normal. OP shows white spot on right tonsil, otherwise normal (not enlarged, no erythema). Mild tenderness at L>R TMJ, no clicking or popping.  Mildly tender over masseter muscles, as well as bilateral maxillary sinuses.  nontender at temporalis muscles and frontal sinuses. Neck: no  lymphadenopathy or mass Heart: regular rate and rhythm Lungs: clear bilaterally Psych: normal mood, affect, hygiene and grooming Neuro: alert and oriented, cranial nerves intact, normal gait  ASSESSMENT/PLAN:  Generalized anxiety disorder - suboptimally controlled--increase lexapro to 10mg  - Plan: escitalopram (LEXAPRO) 10 MG tablet  Insomnia, unspecified type - continue Ambien prn. If insomnia worsens when lexapro increased, change time of dosing  Jaw pain - suspect component of TMJ, clenching. f/u with dentist; ROM exercises. NSAIDs x 2 weeks. Consider muscle relaxant   Discuss your jaw pain and headaches with the dentist. I do suspect that you are having some TMJ issues, likely related to grinding or clenching, which might possibly improve as your anxiety improves (with higher dose of medication). Sometimes people need physical therapy to help. Consider trying to find some stretches and information online about TMJ. Some people need muscle relaxants at bed time (diazepam/valium can work well--to help with nerves and muscles). For now, let's start with stopping the Advil, and instead changing to Aleve twice daily with food.  If this isn't helping, increase to 2 tablets twice daily with food.  Use this regularly for at least a week, for up to 2 weeks at the higher dosage.  Take the Aleve with breakfast and dinner; discontinue or cut back the dose if you develop stomach pain/discomfort/side effects.  Do not take other over-the-counter pain medications such as ibuprofen, advil, motrin, at the same time.  Do not use longer than recommended.  It is okay to use acetaminophen (tylenol) along with this medication.  Increase the escitalopram to 2 tablets daily (taken at the same time--feel free to use 1.5 tablets for a couple of days before changing to two)--the new prescription is for 10mg  tablet. If it causes increased insomnia, you can try switching it to the morning. I suspect that your insomnia  is at least in part due to the anxiety, which should improve as the dose is increased.  If you find yourself feeling draggy/tired during the day, and you have switched to daytime dosing, you might want to switch back to the evening.

## 2017-02-03 ENCOUNTER — Encounter: Payer: Self-pay | Admitting: Family Medicine

## 2017-02-03 ENCOUNTER — Ambulatory Visit (INDEPENDENT_AMBULATORY_CARE_PROVIDER_SITE_OTHER): Payer: 59 | Admitting: Family Medicine

## 2017-02-03 VITALS — BP 102/68 | HR 62 | Ht 65.5 in | Wt 135.2 lb

## 2017-02-03 DIAGNOSIS — R6884 Jaw pain: Secondary | ICD-10-CM

## 2017-02-03 DIAGNOSIS — G4700 Insomnia, unspecified: Secondary | ICD-10-CM | POA: Diagnosis not present

## 2017-02-03 DIAGNOSIS — F411 Generalized anxiety disorder: Secondary | ICD-10-CM

## 2017-02-03 MED ORDER — ESCITALOPRAM OXALATE 10 MG PO TABS: 10.0000 mg | ORAL_TABLET | Freq: Every day | ORAL | 1 refills | Status: DC

## 2017-02-03 NOTE — Patient Instructions (Addendum)
Discuss your jaw pain and headaches with the dentist. I do suspect that you are having some TMJ issues, likely related to grinding or clenching, which might possibly improve as your anxiety improves (with higher dose of medication). Sometimes people need physical therapy to help. Consider trying to find some stretches and information online about TMJ. Some people need muscle relaxants at bed time (diazepam/valium can work well--to help with nerves and muscles). For now, let's start with stopping the Advil, and instead changing to Aleve twice daily with food.  If this isn't helping, increase to 2 tablets twice daily with food.  Use this regularly for at least a week, for up to 2 weeks at the higher dosage.  Take the Aleve with breakfast and dinner; discontinue or cut back the dose if you develop stomach pain/discomfort/side effects.  Do not take other over-the-counter pain medications such as ibuprofen, advil, motrin, at the same time.  Do not use longer than recommended.  It is okay to use acetaminophen (tylenol) along with this medication.  Increase the escitalopram to 2 tablets daily (taken at the same time--feel free to use 1.5 tablets for a couple of days before changing to two)--the new prescription is for 10mg  tablet. If it causes increased insomnia, you can try switching it to the morning. I suspect that your insomnia is at least in part due to the anxiety, which should improve as the dose is increased.  If you find yourself feeling draggy/tired during the day, and you have switched to daytime dosing, you might want to switch back to the evening.  I think some of the weight gain is related to travel, and not being on your normal diet routine. I would be surprised to see such gain so quickly on such a low dose.  Hopefully, if you are traveling less, and back to your normal diet, we won't see ongoing weight gain.  Please call to schedule your mammogram.   Jaw Range of Motion Exercises Jaw  range of motion exercises are exercises that help your jaw to move better. These exercises can help to prevent:  Difficulty opening your mouth.  Pain in your jaw while it is both open and closed.  What should I be careful of when doing jaw exercises? Make sure that you only do jaw exercises as directed by your health care provider. You should only move your jaw as far as it can go in each direction, if told to do so by your health care provider. Do not move your jaw into positions that cause you any pain. What exercises should I do?  Stick your jaw forward. Hold this position for 1-2 seconds. Allow your jaw to return to its normal position and rest it there for 1-2 seconds. Do this exercise 8 times.  Stand or sit in front of a mirror. Place your tongue on the roof of your mouth, just behind your top teeth. Slowly open and close your jaw, keeping your tongue on the roof of your mouth. While you open and close your mouth, try to keep your jaw from moving toward one side or the other. Repeat this 8 times.  Move your jaw right. Hold this position for 1-2 seconds. Allow your jaw to return to its normal position, and rest it there for 1-2 seconds. Do this exercise 8 times.  Move your jaw left. Hold this position for 1-2 seconds. Allow your jaw to return to its normal position, and rest it there for 1-2 seconds.  Do this exercise 8 times.  Open your mouth as far as it is can comfortably go. Hold this position for 1-2 seconds. Then close your mouth and rest for 1-2 seconds. Do this exercise 8 times.  Move your jaw in a circular motion, starting toward the right (clockwise). Repeat this 8 times.  Move your jaw in a circular motion, starting toward the left (counterclockwise). Repeat this 8 times. Apply moist heat packs or ice packs to your jaw before or after performing your exercises as directed by your health care provider. What else can I do? Avoid the following, if they cause jaw pain or they  increase your jaw pain:  Chewing gum.  Clenching your jaw or teeth or keeping tension in your jaw muscles.  Leaning on your jaw, such as resting your jaw in your hand while leaning on a desk.  This information is not intended to replace advice given to you by your health care provider. Make sure you discuss any questions you have with your health care provider. Document Released: 06/13/2008 Document Revised: 12/07/2015 Document Reviewed: 06/01/2014 Elsevier Interactive Patient Education  [employer].

## 2017-02-27 ENCOUNTER — Encounter: Payer: Self-pay | Admitting: Family Medicine

## 2017-03-19 ENCOUNTER — Ambulatory Visit: Payer: 59 | Admitting: Family Medicine

## 2017-03-27 ENCOUNTER — Encounter: Payer: Self-pay | Admitting: Family Medicine

## 2017-04-10 ENCOUNTER — Other Ambulatory Visit (INDEPENDENT_AMBULATORY_CARE_PROVIDER_SITE_OTHER): Payer: 59

## 2017-04-10 DIAGNOSIS — Z23 Encounter for immunization: Secondary | ICD-10-CM | POA: Diagnosis not present

## 2017-06-16 ENCOUNTER — Ambulatory Visit (INDEPENDENT_AMBULATORY_CARE_PROVIDER_SITE_OTHER): Payer: 59 | Admitting: Family Medicine

## 2017-06-16 ENCOUNTER — Encounter: Payer: Self-pay | Admitting: Family Medicine

## 2017-06-16 VITALS — BP 124/72 | HR 80 | Ht 65.5 in | Wt 137.6 lb

## 2017-06-16 DIAGNOSIS — F411 Generalized anxiety disorder: Secondary | ICD-10-CM

## 2017-06-16 DIAGNOSIS — R197 Diarrhea, unspecified: Secondary | ICD-10-CM

## 2017-06-16 MED ORDER — ESCITALOPRAM OXALATE 5 MG PO TABS: 5.0000 mg | ORAL_TABLET | Freq: Every day | ORAL | 1 refills | Status: DC

## 2017-06-16 NOTE — Progress Notes (Signed)
Chief Complaint  Patient presents with  . Diarrhea    started on Augmentin last Wed and has had constant diarrhea since this past Sat (stopped taking Fri night). Feels a little nauseated.    She went to a [facility] while in [place] for a sinus infection. She had 10 days of respiratory symptoms, followed by sinus symptoms. She started Augmentin on 11/28. She took it only for 3 days, stopped it 2 days ago when she developed severe diarrhea. She reports her sinus symptoms have completely resolved. Denies any sinus pressure, discolored mucus, postnasal drainage (just slight).  Denies fever. Slight residual cough.  No shortness of breath, wheezing.  She has had persistent diarrhea since 12/1.  Stools are liquid, brown, very little solid portion, mostly watery.  Slight nausea, not vomiting.  Decreased appetite.  Denies abdominal pain, cramping, blood in the stool.  She went to the bathroom 8x/day, and is starting to feel weak.  She is going to [place] on Wednesday.  Lexapro is going to run out next week. She is asking for refill. We increased the dose to 10mg  at her visit in July, but she didn't tolerate it. Felt more nervous, groggy and had palpitations on the 10mg . Feels better on the 5mg , has been cutting the tablets in half, and feels like it is working well. Was able to cut back some on her Lorrin Mais use some.  PMH, PSH, SH reviewed  Outpatient Encounter Medications as of 06/16/2017  Medication Sig Note  . escitalopram (LEXAPRO) 5 MG tablet Take 1 tablet (5 mg total) by mouth daily.   Marland Kitchen zolpidem (AMBIEN) 5 MG tablet Take 0.5-1 tablets (2.5-5 mg total) by mouth at bedtime as needed for sleep.   . [DISCONTINUED] escitalopram (LEXAPRO) 10 MG tablet Take 1 tablet (10 mg total) by mouth daily. 06/16/2017: Taking 5mg   . ALPRAZolam (XANAX) 0.25 MG tablet Take 1 tablet (0.25 mg total) by mouth 3 (three) times daily as needed for anxiety. (Patient not taking: Reported on 02/03/2017)   . Multiple Vitamin (MULTI  VITAMIN PO) Take 1 tablet by mouth daily.   . Omega-3 Fatty Acids (OMEGA-3 FISH OIL PO) Take by mouth.   . TURMERIC PO Take 2 tablets by mouth 2 (two) times daily.    No facility-administered encounter medications on file as of 06/16/2017.    (taking 1/2 tablet of 10mg  lexapro prior to today's visit.  Allergies  Allergen Reactions  . Sulfa Antibiotics Swelling and Rash  . Zithromax [Azithromycin] Other (See Comments)    Abdominal pain/cramping   ROS: no fever, chills, headache, dizziness, URI symptoms.  Only slight residual cough, no chest pain, shortness of breath. No bleeding, bruising, rash. No nausea, vomiting.  Some decreased appetite.  +diarrhea per HPI. No urinary complaints.  +fatigue.  No dizziness or presyncope  PHYSICAL EXAM:  BP 124/72   Pulse 80   Ht 5' 5.5" (1.664 m)   Wt 137 lb 9.6 oz (62.4 kg)   BMI 22.55 kg/m   Well-appearing, pleasant female in no distress HEENT: PERRL, EOMI, conjunctiva an sclera clear. OP is clear, moist mucus membranes Neck: no lymphadenopathy or mass Heart: regular rate and rhythm Lungs: clear bilaterally Abdomen: normal bowel sounds, soft, nontender, no organomegaly or mass Extremities: no edema Skin: normal turgor, no rash Psych: normal mood, affect, hygiene and grooming  ASSESSMENT/PLAN:  Diarrhea, unspecified type - side effect from Augmentin, vs poss C.diff (suspect former). Probiotics, avoid dairy. Hold on using imodium until c.diff toxin back  Generalized anxiety  disorder - doing well on 5mg --change back to 5mg  tablet - Plan: escitalopram (LEXAPRO) 5 MG tablet  Leaves Wed for [place], contact on cell phone. OK to LM on VM Counseled re: BRAT diet, avoiding dairy, probiotics. Counseled to seek re-eval if increasing abdominal pain, fever, blood in stool, s/sx dehydration. Discussed fluid intake, electrolytes. Discussed her anxiety--doing better now, on just 5mg .  Refilled x 6 mos.  25-30 min visit, more than 1/2 spent  counseling.   Please return the stool study so we can rule out C.diff infection. If it is C.diff, you will need a different antibiotic to treat this infection. Sometimes this can be hard to clear, so be sure to let us know if you have any ongoing or recurrent symptoms of diarrhea.  Use strict infection protocols to try and prevent the spread.  If there is NO C.diff (which means that this is just a side effect from the Augmentin), then it is safe to use imodium, if needed.  You should take probiotics (ie Align).  Be sure to drink plenty of liquids to stay well hydrated. Eat a bland diet (BRAT--bananas, rice, applesauce, toast). Stick with bland diet, no spicy or greasy foods.  Soups (non-creamy/non-dairy) are also good, such as chicken noodle soup. Avoid dairy products for at least a week.  If your sinus infection symptoms recur, first use supportive measures (sinus rinses, mucinex, decongestants), and if not improving, contact us for a different antibiotic.

## 2017-06-16 NOTE — Patient Instructions (Addendum)
Please return the stool study so we can rule out C.diff infection. (FILL THE ORANGE CONTAINER)  If it is C.diff, you will need a different antibiotic to treat this infection. Sometimes this can be hard to clear, so be sure to let us know if you have any ongoing or recurrent symptoms of diarrhea.  Use strict infection protocols to try and prevent the spread.  If there is NO C.diff (which means that this is just a side effect from the Augmentin), then it is safe to use imodium, if needed.  You should take probiotics (ie Align).  Be sure to drink plenty of liquids to stay well hydrated. Eat a bland diet (BRAT--bananas, rice, applesauce, toast). Stick with bland diet, no spicy or greasy foods.  Soups (non-creamy/non-dairy) are also good, such as chicken noodle soup. Avoid dairy products for at least a week.  If your sinus infection symptoms recur, first use supportive measures (sinus rinses, mucinex, decongestants), and if not improving, contact us for a different antibiotic.   Diarrhea, Adult Diarrhea is frequent loose and watery bowel movements. Diarrhea can make you feel weak and cause you to become dehydrated. Dehydration can make you tired and thirsty, cause you to have a dry mouth, and decrease how often you urinate. Diarrhea typically lasts 2-3 days. However, it can last longer if it is a sign of something more serious. It is important to treat your diarrhea as told by your health care provider. Follow these instructions at home: Eating and drinking  Follow these recommendations as told by your health care provider:  Take an oral rehydration solution (ORS). This is a drink that is sold at pharmacies and retail stores.  Drink clear fluids, such as water, ice chips, diluted fruit juice, and low-calorie sports drinks.  Eat bland, easy-to-digest foods in small amounts as you are able. These foods include bananas, applesauce, rice, lean meats, toast, and crackers.  Avoid drinking fluids that  contain a lot of sugar or caffeine, such as energy drinks, sports drinks, and soda.  Avoid alcohol.  Avoid spicy or fatty foods.  General instructions  Drink enough fluid to keep your urine clear or pale yellow.  Wash your hands often. If soap and water are not available, use hand sanitizer.  Make sure that all people in your household wash their hands well and often.  Take over-the-counter and prescription medicines only as told by your health care provider.  Rest at home while you recover.  Watch your condition for any changes.  Take a warm bath to relieve any burning or pain from frequent diarrhea episodes.  Keep all follow-up visits as told by your health care provider. This is important. Contact a health care provider if:  You have a fever.  Your diarrhea gets worse.  You have new symptoms.  You cannot keep fluids down.  You feel light-headed or dizzy.  You have a headache  You have muscle cramps. Get help right away if:  You have chest pain.  You feel extremely weak or you faint.  You have bloody or black stools or stools that look like tar.  You have severe pain, cramping, or bloating in your abdomen.  You have trouble breathing or you are breathing very quickly.  Your heart is beating very quickly.  Your skin feels cold and clammy.  You feel confused.  You have signs of dehydration, such as: ? Dark urine, very little urine, or no urine. ? Cracked lips. ? Dry mouth. ? Sunken eyes. ?  Sleepiness. ? Weakness. This information is not intended to replace advice given to you by your health care provider. Make sure you discuss any questions you have with your health care provider. Document Released: 06/21/2002 Document Revised: 11/09/2015 Document Reviewed: 03/07/2015 Elsevier Interactive Patient Education  2017 [employer].

## 2017-06-17 ENCOUNTER — Encounter: Payer: Self-pay | Admitting: Family Medicine

## 2017-06-17 NOTE — Telephone Encounter (Signed)
Please have Kristie Howell look into this--it appears the lab told her she gave the specimen in the wrong bottle.  Sandy specifically told her that the ORANGE specimen bottle was the correct one for C.diff toxin, and the lab person told her this wasn't correct.

## 2017-06-17 NOTE — Telephone Encounter (Signed)
Given to [place] for review. Victorino December

## 2017-06-24 ENCOUNTER — Other Ambulatory Visit: Payer: Self-pay | Admitting: Family Medicine

## 2017-06-24 DIAGNOSIS — G4700 Insomnia, unspecified: Secondary | ICD-10-CM

## 2017-06-24 NOTE — Telephone Encounter (Signed)
Is this okay to call in? 

## 2017-09-15 ENCOUNTER — Other Ambulatory Visit: Payer: Self-pay | Admitting: Family Medicine

## 2017-09-15 DIAGNOSIS — G4700 Insomnia, unspecified: Secondary | ICD-10-CM

## 2017-09-15 NOTE — Telephone Encounter (Signed)
Is this okay to refill? 

## 2017-12-10 ENCOUNTER — Other Ambulatory Visit: Payer: Self-pay | Admitting: Family Medicine

## 2017-12-10 DIAGNOSIS — G4700 Insomnia, unspecified: Secondary | ICD-10-CM

## 2017-12-10 NOTE — Telephone Encounter (Signed)
[facility] is requesting to fill pt ambein. Please advise [facility]

## 2018-02-12 ENCOUNTER — Telehealth: Payer: Self-pay

## 2018-02-12 NOTE — Telephone Encounter (Signed)
Left message on voicemail for patient to call back and schedule CPE.   

## 2018-03-11 ENCOUNTER — Other Ambulatory Visit: Payer: Self-pay

## 2018-03-11 DIAGNOSIS — G4700 Insomnia, unspecified: Secondary | ICD-10-CM

## 2018-03-11 MED ORDER — ZOLPIDEM TARTRATE 5 MG PO TABS: ORAL_TABLET | ORAL | 0 refills | Status: DC

## 2018-03-11 NOTE — Telephone Encounter (Signed)
Please make sure that she is on the cancellation list. I see that she was scheduled for 08/2018, but is actually past due for wellness visit/med check.  Refill was done

## 2018-03-11 NOTE — Telephone Encounter (Signed)
Pt called stating she needs a refill on the following medication. Please advise.

## 2018-03-12 NOTE — Telephone Encounter (Signed)
Pt has been placed on the cancellation list.

## 2018-04-15 ENCOUNTER — Encounter: Payer: Self-pay | Admitting: Family Medicine

## 2018-05-14 ENCOUNTER — Other Ambulatory Visit: Payer: Self-pay | Admitting: Family Medicine

## 2018-05-14 DIAGNOSIS — G4700 Insomnia, unspecified: Secondary | ICD-10-CM

## 2018-06-17 ENCOUNTER — Encounter: Payer: Self-pay | Admitting: *Deleted

## 2018-07-20 ENCOUNTER — Other Ambulatory Visit: Payer: Self-pay | Admitting: Family Medicine

## 2018-07-20 DIAGNOSIS — G4700 Insomnia, unspecified: Secondary | ICD-10-CM

## 2018-07-20 NOTE — Telephone Encounter (Signed)
Is this okay to refill? 

## 2018-07-20 NOTE — Telephone Encounter (Signed)
Has appt scheduled 08/2018

## 2018-09-01 NOTE — Patient Instructions (Addendum)
  HEALTH MAINTENANCE RECOMMENDATIONS:  It is recommended that you get at least 30 minutes of aerobic exercise at least 5 days/week (for weight loss, you may need as much as 60-90 minutes). This can be any activity that gets your heart rate up. This can be divided in 10-15 minute intervals if needed, but try and build up your endurance at least once a week.  Weight bearing exercise is also recommended twice weekly.  Eat a healthy diet with lots of vegetables, fruits and fiber.  "Colorful" foods have a lot of vitamins (ie green vegetables, tomatoes, red peppers, etc).  Limit sweet tea, regular sodas and alcoholic beverages, all of which has a lot of calories and sugar.  Up to 1 alcoholic drink daily may be beneficial for women (unless trying to lose weight, watch sugars).  Drink a lot of water.  Calcium recommendations are 1200-1500 mg daily (1500 mg for postmenopausal women or women without ovaries), and vitamin D 1000 IU daily.  This should be obtained from diet and/or supplements (vitamins), and calcium should not be taken all at once, but in divided doses.  Monthly self breast exams and yearly mammograms for women over the age of 55 is recommended.  Sunscreen of at least SPF 30 should be used on all sun-exposed parts of the skin when outside between the hours of 10 am and 4 pm (not just when at beach or pool, but even with exercise, golf, tennis, and yard work!)  Use a sunscreen that says "broad spectrum" so it covers both UVA and UVB rays, and make sure to reapply every 1-2 hours.  Remember to change the batteries in your smoke detectors when changing your clock times in the spring and fall.  Use your seat belt every time you are in a car, and please drive safely and not be distracted with cell phones and texting while driving.  You are due for Prevnar-13; in 1 year you will get the pneumovax (series of 2 different pneumonia shots a year apart).  I recommend getting the new shingles vaccine  (Shingrix). You will need to check with your insurance to see if it is covered, and if covered by Medicare Part D, you need to get from the pharmacy rather than our office.  It is a series of 2 injections, spaced 2 months apart.    We are going to try Belsomra in place of zolpidem Lorrin Mais) for sleep.  This should be taken every night.  After 7-10 days, the dose can be increased, if needed.  We start at 10mg , increase to 15mg , and maximum dose is 20mg . Contact us if the 10mg  dose isn't effective, for [place] to give you the higher dose (samples versus prescription). This takes a little longer to work, and works differently, but is the safest medication for regular use for people 10 and older. If it isn't effective, I'm fine with you continuing the very low dose zolpidem. Continue to work on IT trainer and good sleep hygiene.  Please call the breast center and schedule BOTH your mammogram (okay to pre-medicate with alprazolam, but have someone drive you there) and your bone density test for the same day.

## 2018-09-01 NOTE — Progress Notes (Addendum)
Chief Complaint  Patient presents with  . Annual Exam    nonfasting CPE will come back for labs but did want to discuss possible having CA-125 drawn per GYN-oncologist. Was released from her care but did mention needing this lab test. Sees eye doctor regularly.     Kristie Howell is a 68 y.o. female who presents for annual physical exam with concerns about insomnia and anxiety, as previously treated and discussed.  Insomnia: Sometimes she is fine for long periods, but other times she has trouble getting to sleep. Recently she has been taking zolpidem (just half of 3m tablet) at least 3x/week, but some weeks uses it less.  She has trouble most nights, but sometimes will try other OTC medications. She has trouble falling asleep. Melatonin and various teas, and other OTC meds weren't helpful.   Anxiety: Was started on lexapro 550min 11/2016, when she had worsened anxiety (related to husband's dx of recurrent thyroid cancer). At that time she reported ongoing anxiety, constant worry, some irritability. She was also given alprazolam for prn use. She reports that she did fine on the Lexapro, anxiety improved, and she weaned herself off in the Spring (09/2017), and has been doing okay since then. She has xanax, but "I don't think it works for me"  Her prescription is from 2018, for 0.2537mand never tried taking two together. She wants something for prn, doesn't want to take something daily. She needs it before going to the dentist, and for flying. She is also past due for mammogram, which causes her great anxiety.  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 There was microinvasion on the left side. She last saw Dr. GehAlycia Rossetti June 2018, and after CA 125 was normal on 5 year follow-up, she was released from her care.  Pt was told to continue to have Ca-125 monitored. She denies any pelvic pain, bleeding or other GYN concerns.  She no  longer sees a gynecologist (they have all retired). She does to breast exams, notes some chronic mild tenderness in the left breast, unchanged, no other breast issues.  Vitamin D was checked 01/2017, it was normal at 33; she was taking MVI daily at that time. She has slacked off in taking vitamins, not taking currently.  Immunization History  Administered Date(s) Administered  . Influenza Split 05/05/2013  . Influenza,inj,Quad PF,6+ Mos 05/16/2014, 04/10/2017  . Influenza-Unspecified 05/05/2018  . Tdap 03/23/2007, 11/18/2016  . Zoster 03/28/2013   Last Pap smear: 07/2016 through Dr. GehAlycia Rossettist mammogram: 05/2013 (knows she is past due, too anxious to get) Last colonoscopy:03/2016, due again 2022  (Dr. MagWatt Climesue to family history Last DEXA: never Ophtho: yearly  Dentist: every 6 months Exercise:Usual routine is walking about 2 days/week with her husband after work, for 45 mins. [place], occasionally does bike/treadmill there, but sporadically.  Lifts one grandchild regularly (28-30#).  Other doctors caring for patient include:  GYN-onc Dr. GehAlycia Rossettio longer sees) GYN (Was Dr. GotCherylann Banasetired) Dentist: Dr. ChuKendrick Frieshtho: Dr. WeaVesta MixerSO dermatology GI: Dr MagWatt ClimesDepression screen:  negative Fall Screen negative Functional status survey: unremarkable Mini-Cog screen normal See full screens in Epic   End of Life Discussion:  Patient has a living will and medical power of attorney. She was asked to get us Koreacopy  Past Medical History:  Diagnosis Date  . Abnormal TSH  borderline; yearly checks  . Anxiety 12-04-11   prone to panic attacks if stressed, heart rate increases  . Diverticulosis 02/2011  . Hemorrhoids    internal/external seen on colonoscopy 02/2011  . Hyperplastic colon polyp 02/2011   Dr. Watt Climes q64yr(+FHx)  . Insomnia 2012  . Ovarian tumor of borderline malignancy   . PMB  (postmenopausal bleeding)   . PONV (postoperative nausea and vomiting)     Past Surgical History:  Procedure Laterality Date  . ABDOMINAL HYSTERECTOMY  11/2011   Total robotic hysterectomy bilateral salpingo-oophorectomy, left pelvic lymph node dissection and left para-aortic lymph node dissection, omental biopsy, peritoneal biopsy  . CCarroll  x2  . FINGER SURGERY     left middle finger (same as above)  . LYMPH NODE DISSECTION  12/10/2011   Procedure: LYMPH NODE DISSECTION;  Surgeon: PImagene GurneyA. GAlycia Rossetti MD;  Location: [facility];  Service: Gynecology;  Laterality: N/A;  Possible Lymph Nodes  . MASS EXCISION  06/26/2012   Procedure: MINOR EXCISION OF MASS;  Surgeon: RCammie Sickle, MD;  [facility]  Service: Orthopedics;  Laterality: Left;  mucoid cyst left long finger     Social History   Socioeconomic History  . Marital status: Married    Spouse name: Not on file  . Number of children: 2  . Years of education: Not on file  . Highest education level: Not on file  Occupational History  . Occupation: homemaker    Employer: NOT EMPLOYED  Social Needs  . Financial resource strain: Not on file  . Food insecurity:    Worry: Not on file    Inability: Not on file  . Transportation needs:    Medical: Not on file    Non-medical: Not on file  Tobacco Use  . Smoking status: Never Smoker  . Smokeless tobacco: Never Used  Substance and Sexual Activity  . Alcohol use: Yes    Alcohol/week: 2.0 standard drinks    Types: 2 Standard drinks or equivalent per week    Comment: 1-2 drinks per week.  . Drug use: No  . Sexual activity: Yes    Partners: Male    Birth control/protection: Post-menopausal  Lifestyle  . Physical activity:    Days per week: Not on file    Minutes per session: Not on file  . Stress: Not on file  Relationships  . Social connections:    Talks on phone: Not on file    Gets together: Not on file    Attends religious  service: Not on file    Active member of club or organization: Not on file    Attends meetings of clubs or organizations: Not on file    Relationship status: Not on file  . Intimate partner violence:    Fear of current or ex partner: Not on file    Emotionally abused: Not on file    Physically abused: Not on file    Forced sexual activity: Not on file  Other Topics Concern  . Not on file  Social History Narrative   [place]ives with husband.  [place] daughter, 2nd child expected 11/2018.    [place]    Family History  Problem Relation Age of Onset  . Thyroid disease Mother   . Melanoma Mother   . Diabetes Mother        borderline (in  her 90's)  . Stroke Mother 28  . Hypertension Mother   . Cancer Father 21       Colon cancer  . Ovarian cancer Paternal Grandmother        vs uterine--lived to be in her 45's  . Heart attack Maternal Grandmother   . Breast cancer Neg Hx     Outpatient Encounter Medications as of 09/02/2018  Medication Sig  . zolpidem (AMBIEN) 5 MG tablet TAKE 1/2 TO 1 TABLET AT BEDTIME AS NEEDED FOR SLEEP  . ALPRAZolam (XANAX) 0.25 MG tablet Take 1-2 tablets (0.25-0.5 mg total) by mouth 3 (three) times daily as needed for anxiety.  . Multiple Vitamin (MULTI VITAMIN PO) Take 1 tablet by mouth daily.  . Omega-3 Fatty Acids (OMEGA-3 FISH OIL PO) Take by mouth.  . Suvorexant (BELSOMRA) 10 MG TABS Take 1 tablet by mouth at bedtime.  . [DISCONTINUED] ALPRAZolam (XANAX) 0.25 MG tablet Take 1 tablet (0.25 mg total) by mouth 3 (three) times daily as needed for anxiety. (Patient not taking: Reported on 09/02/2018)  . [DISCONTINUED] escitalopram (LEXAPRO) 5 MG tablet Take 1 tablet (5 mg total) by mouth daily. (Patient not taking: Reported on 09/02/2018)  . [DISCONTINUED] TURMERIC PO Take 2 tablets by mouth 2 (two) times daily.   No facility-administered encounter medications on file as of  09/02/2018.    NOT taking Belsomra prior to today's visit.   Allergies  Allergen Reactions  . Sulfa Antibiotics Swelling and Rash  . Zithromax [Azithromycin] Other (See Comments)    Abdominal pain/cramping    ROS: The patient denies anorexia, fever, weight changes (slight gain since less active), headaches, vision changes, decreased hearing, ear pain, sore throat, breast concerns, chest pain, palpitations, dizziness, syncope, dyspnea on exertion, cough, swelling, nausea, vomiting, diarrhea, constipation, abdominal pain, melena, hematochezia, indigestion/heartburn, hematuria, incontinence, dysuria, vaginal bleeding, discharge, odor or itch, genital lesions, joint pains (mild finger arthritis, unchanged); no numbness, tingling, weakness, tremor, suspicious skin lesions, abnormal bleeding/bruising, or enlarged lymph nodes. Intermittent, mild palpitations (chronic), mostly at night, when anxious. Some arthritis developing in her fingers--noticing some deformity at her knuckles. Some stiffness and swelling in the mornings. +anxiety-now mostly situational (dentist/flying); not daily like when increased stressors in the past +vaginal dryness and discomfort with intercourse Mild hot flashes at night.   PHYSICAL EXAM:  BP 120/70   Pulse 80   Ht 5' 4.5" (1.638 m)   Wt 138 lb 6.4 oz (62.8 kg)   BMI 23.39 kg/m   Wt Readings from Last 3 Encounters:  09/02/18 138 lb 6.4 oz (62.8 kg)  06/16/17 137 lb 9.6 oz (62.4 kg)  02/03/17 135 lb 3.2 oz (61.3 kg)    General Appearance:   Alert, cooperative, no distress, appears stated ageor younger, in good spirits  Head:   Normocephalic, without obvious abnormality, atraumatic  Eyes:   PERRL, conjunctiva/corneas clear, EOM's intact, fundi benign  Ears:   Normal TM's and external ear canals  Nose:  Nares normal, mucosa normal, no drainage or sinus tenderness  Throat:  Lips, mucosa, and tongue normal; teeth and gums normal   Neck:  Supple, no lymphadenopathy; thyroid: no enlargement/tenderness/nodules; no carotid bruit or JVD  Back:  Spine nontender, no curvature, ROM normal, no CVA tenderness.  Lungs:   Clear to auscultation bilaterally without wheezes, rales or ronchi; respirations unlabored  Chest Wall:   No tenderness or deformity  Heart:   Regular rate and rhythm, S1 and S2 normal, no murmur, rub  or gallop  Breast  Exam:   No nipple inversion, discharge, skin dimpling or masses.  No axillary lymphadenopathy  Abdomen:   Soft, non-tender, nondistended, normoactive bowel sounds, no masses, no hepatosplenomegaly. WHSS  Genitalia:   Normal external genitalia, without lesions. No abnormal vaginal discharge. Uterus is surgically absent.  No adnexal masses or tenderness  Rectal:  Normal sphincter tone, no masses.  Heme negative light brown stool  Extremities:  No clubbing, cyanosis or edema. Left 2nd and third fingers have mild bony hypertrophy at the DIP joints.   Pulses:  2+ and symmetric all extremities  Skin:  Skin color, texture, turgor normal, no rashes or lesions.  Lymph nodes:  Cervical, supraclavicular, and axillary nodes normal  Neurologic:  CNII-XII intact, normal strength, sensation and gait; reflexes 2+ and symmetric throughout; mild delayed relaxation noted at patellar reflexes bilaterally  Psych: Normal mood, affect, hygiene and grooming   ASSESSMENT/PLAN:  Annual physical exam - Plan: POCT Urinalysis DIP (Proadvantage Device), CBC with Differential/Platelet, Lipid panel, TSH, Comprehensive metabolic panel, CA 081  Ovarian neoplasm with low malignant potential - with h/o elevated Ca-125, due for level for monitoring for recurrence - Plan: CA 125  Generalized anxiety disorder - no longer with chronic anxiety, just situational. Okay to remain off lexapro. Try 0.38m xanax if 0.25 ineffective. consider ativan  - Plan: ALPRAZolam (XANAX) 0.25 MG tablet  Insomnia, unspecified type - interested in trying Belsomra--discussed proper use. If ineffective, okay to go back to 2.532mzolpidem prn - Plan: Suvorexant (BELSOMRA) 10 MG TABS  Postmenopausal estrogen deficiency - check baseline DEXA.  Resume 1000 IU D3 daily. Discussed Ca, D and wt-bearing exercises - Plan: DG Bone Density  Immunization counseling - [id number], will do when she returns for labs. Risks/SE of prevnar and Shingrix reviewed. Will check insurance and schedule Shingrix when convenient  Past due for mammo--discussed importance in getting (she is afraid bc she had friends dx'd with breast cancer shortly after mammograms which didn't catch dx). Advised she can use xanax (and have someone drive her) if required, in order to have her mammo done. Rec she schedule mammo and DEXA for same day.  Atrophic vaginitis--discussed lubricants, consider trying coconut oil. Not interested in topical premarin (given in the past)    CBC, c-met, CA 125, TSH, lipid  Discussed monthly self breast exams and yearly mammograms (past due--risks/reasons for getting reviewed in detail and strongly encouraged her to schedule 3D mammo, even if she needs husband to drive her and take alprazolam prior); at least 30 minutes of aerobic activity at least 5 days/week, weight-bearing exercise at least 2x/week; proper sunscreen use reviewed; healthy diet, including goals of calcium and vitamin D intake and alcohol recommendations (less than or equal to 1 drink/day) reviewed; regular seatbelt use; changing batteries in smoke detectors. Immunization recommendations discussed--Prevnar-13 recommended today--she is going OOT tomorrow and prefers to return for shot when she gets her blood drawn. Shingrix recommended, risks, SE reviewed; needs to check insurance, then schedule NV. Continue yearly flu shots, now high dose. Colonoscopy recommendations reviewed--due again 03/2021     Significant counseling performed separate from routine HM, with regard to anxiety, risks/SE of meds, sleep hygiene, relaxation techniques (non-pharm treatment for insomnia).   We are going to try Belsomra in place of zolpidem (aLorrin Maisfor sleep.  This should be taken every night.  After 7-10 days, the dose can be increased, if needed.  We start at 106mincrease to 56m48mnd maximum dose is 20mg9mis takes a little longer to work,  and works differently, but is the safest medication for regular use for people 26 and older. If it isn't effective, I'm fine with you continuing the very low dose zolpidem. Continue to work on IT trainer and good sleep hygiene.

## 2018-09-02 ENCOUNTER — Ambulatory Visit (INDEPENDENT_AMBULATORY_CARE_PROVIDER_SITE_OTHER): Payer: 59 | Admitting: Family Medicine

## 2018-09-02 ENCOUNTER — Encounter: Payer: Self-pay | Admitting: Family Medicine

## 2018-09-02 VITALS — BP 120/70 | HR 80 | Ht 64.5 in | Wt 138.4 lb

## 2018-09-02 DIAGNOSIS — G4700 Insomnia, unspecified: Secondary | ICD-10-CM

## 2018-09-02 DIAGNOSIS — Z0000 Encounter for general adult medical examination without abnormal findings: Secondary | ICD-10-CM | POA: Diagnosis not present

## 2018-09-02 DIAGNOSIS — Z7189 Other specified counseling: Secondary | ICD-10-CM | POA: Diagnosis not present

## 2018-09-02 DIAGNOSIS — D3910 Neoplasm of uncertain behavior of unspecified ovary: Secondary | ICD-10-CM | POA: Diagnosis not present

## 2018-09-02 DIAGNOSIS — Z780 Asymptomatic menopausal state: Secondary | ICD-10-CM

## 2018-09-02 DIAGNOSIS — Z7185 Encounter for immunization safety counseling: Secondary | ICD-10-CM

## 2018-09-02 DIAGNOSIS — F411 Generalized anxiety disorder: Secondary | ICD-10-CM

## 2018-09-02 LAB — POCT URINALYSIS DIP (PROADVANTAGE DEVICE)
BILIRUBIN UA: NEGATIVE
BILIRUBIN UA: NEGATIVE mg/dL
GLUCOSE UA: NEGATIVE mg/dL
Leukocytes, UA: NEGATIVE
Nitrite, UA: NEGATIVE
Protein Ur, POC: NEGATIVE mg/dL
SPECIFIC GRAVITY, URINE: 1.02
Urobilinogen, Ur: NEGATIVE
pH, UA: 6 (ref 5.0–8.0)

## 2018-09-02 MED ORDER — SUVOREXANT 10 MG PO TABS: 1.0000 | ORAL_TABLET | Freq: Every day | ORAL | 0 refills | Status: DC

## 2018-09-02 MED ORDER — ALPRAZOLAM 0.25 MG PO TABS: 0.2500 mg | ORAL_TABLET | Freq: Three times a day (TID) | ORAL | 0 refills | Status: DC | PRN

## 2018-09-16 ENCOUNTER — Other Ambulatory Visit: Payer: 59

## 2018-09-16 DIAGNOSIS — Z0000 Encounter for general adult medical examination without abnormal findings: Secondary | ICD-10-CM

## 2018-09-16 DIAGNOSIS — D3910 Neoplasm of uncertain behavior of unspecified ovary: Secondary | ICD-10-CM

## 2018-09-17 LAB — CA 125: Cancer Antigen (CA) 125: 15.6 U/mL (ref 0.0–38.1)

## 2018-09-17 LAB — LIPID PANEL
Chol/HDL Ratio: 3 ratio (ref 0.0–4.4)
Cholesterol, Total: 229 mg/dL — ABNORMAL HIGH (ref 100–199)
HDL: 76 mg/dL (ref >39)
LDL Calculated: 136 mg/dL — ABNORMAL HIGH (ref 0–99)
Triglycerides: 87 mg/dL (ref 0–149)
VLDL Cholesterol Cal: 17 mg/dL (ref 5–40)

## 2018-09-17 LAB — CBC WITH DIFFERENTIAL/PLATELET
BASOS: 1 %
Basophils Absolute: 0 10*3/uL (ref 0.0–0.2)
EOS (ABSOLUTE): 0.1 10*3/uL (ref 0.0–0.4)
Eos: 3 %
Hematocrit: 41.2 % (ref 34.0–46.6)
Hemoglobin: 14.8 g/dL (ref 11.1–15.9)
Immature Grans (Abs): 0 10*3/uL (ref 0.0–0.1)
Immature Granulocytes: 0 %
Lymphocytes Absolute: 1.5 10*3/uL (ref 0.7–3.1)
Lymphs: 30 %
MCH: 31.8 pg (ref 26.6–33.0)
MCHC: 35.9 g/dL — ABNORMAL HIGH (ref 31.5–35.7)
MCV: 88 fL (ref 79–97)
Monocytes Absolute: 0.3 10*3/uL (ref 0.1–0.9)
Monocytes: 7 %
Neutrophils Absolute: 3 10*3/uL (ref 1.4–7.0)
Neutrophils: 59 %
Platelets: 209 10*3/uL (ref 150–450)
RBC: 4.66 x10E6/uL (ref 3.77–5.28)
RDW: 12.9 % (ref 11.7–15.4)
WBC: 4.9 10*3/uL (ref 3.4–10.8)

## 2018-09-17 LAB — COMPREHENSIVE METABOLIC PANEL
ALT: 22 IU/L (ref 0–32)
AST: 27 IU/L (ref 0–40)
Albumin/Globulin Ratio: 1.9 (ref 1.2–2.2)
Albumin: 4.5 g/dL (ref 3.8–4.8)
Alkaline Phosphatase: 90 IU/L (ref 39–117)
BUN/Creatinine Ratio: 14 (ref 12–28)
BUN: 11 mg/dL (ref 8–27)
Bilirubin Total: 0.8 mg/dL (ref 0.0–1.2)
CALCIUM: 9.8 mg/dL (ref 8.7–10.3)
CO2: 24 mmol/L (ref 20–29)
Chloride: 100 mmol/L (ref 96–106)
Creatinine, Ser: 0.81 mg/dL (ref 0.57–1.00)
GFR calc Af Amer: 88 mL/min/{1.73_m2} (ref >59)
GFR, EST NON AFRICAN AMERICAN: 76 mL/min/{1.73_m2} (ref >59)
Globulin, Total: 2.4 g/dL (ref 1.5–4.5)
Glucose: 87 mg/dL (ref 65–99)
Potassium: 4.5 mmol/L (ref 3.5–5.2)
Sodium: 140 mmol/L (ref 134–144)
Total Protein: 6.9 g/dL (ref 6.0–8.5)

## 2018-09-17 LAB — TSH: TSH: 3.81 u[IU]/mL (ref 0.450–4.500)

## 2018-09-24 ENCOUNTER — Encounter: Payer: Self-pay | Admitting: Family Medicine

## 2018-09-24 DIAGNOSIS — G4700 Insomnia, unspecified: Secondary | ICD-10-CM

## 2018-09-25 MED ORDER — ZOLPIDEM TARTRATE 5 MG PO TABS: 2.5000 mg | ORAL_TABLET | Freq: Every evening | ORAL | 1 refills | Status: DC | PRN

## 2018-10-12 ENCOUNTER — Encounter: Payer: Self-pay | Admitting: Family Medicine

## 2018-10-12 ENCOUNTER — Ambulatory Visit (INDEPENDENT_AMBULATORY_CARE_PROVIDER_SITE_OTHER): Payer: 59 | Admitting: Family Medicine

## 2018-10-12 ENCOUNTER — Other Ambulatory Visit: Payer: Self-pay

## 2018-10-12 VITALS — Ht 64.5 in | Wt 137.0 lb

## 2018-10-12 DIAGNOSIS — M545 Low back pain, unspecified: Secondary | ICD-10-CM

## 2018-10-12 MED ORDER — MELOXICAM 15 MG PO TABS: 15.0000 mg | ORAL_TABLET | Freq: Every day | ORAL | 0 refills | Status: DC

## 2018-10-12 MED ORDER — METHOCARBAMOL 500 MG PO TABS: 500.0000 mg | ORAL_TABLET | Freq: Three times a day (TID) | ORAL | 0 refills | Status: DC | PRN

## 2018-10-12 NOTE — Patient Instructions (Signed)
Take meloxicam once daily with food. This is an anti-inflammatory medication.  If it bothers your stomach, cut the dose in half. Do NOT take ibuprofen, aleve, naproxen, advil, motrin, Goody or BC powder along with this medication. You CAN take acetaminophen (tylenol) safely along with the meloxicam. We are also prescribing a muscle relaxant, to use as needed for muscle spasm and pain.  Take 1-2 pills every 8 hours as needed. Continue to use moist heat, do some gentle stretches, and try massage. You can continue the SalonPas patches.  If your pain isn't resolving with these measures, we can refer you to physical therapy.   Low Back Sprain  A sprain is a stretch or tear in the bands of tissue that hold bones and joints together (ligaments). Sprains of the lower back (lumbar spine) are a common cause of low back pain. A sprain occurs when ligaments are overextended or stretched beyond their limits. The ligaments can become inflamed, resulting in pain and sudden muscle tightening (spasms). A sprain can be caused by an injury (trauma), or it can develop gradually due to overuse. There are three types of sprains:  Grade 1 is a mild sprain involving an overstretched ligament or a very slight tear of the ligament.  Grade 2 is a moderate sprain involving a partial tear of the ligament.  Grade 3 is a severe sprain involving a complete tear of the ligament. What are the causes? This condition may be caused by:  Trauma, such as a fall or a hit to the body.  Twisting or overstretching the back. This may result from doing activities that require a lot of energy, such as lifting heavy objects. What increases the risk? The following factors may increase your risk of getting this condition:  Playing contact sports.  Participating in sports or activities that put excessive stress on the back and require a lot of bending and twisting, including: ? Lifting weights or heavy objects. ? Gymnastics. ?  Soccer. ? Figure skating. ? Snowboarding.  Being overweight or obese.  Having poor strength and flexibility. What are the signs or symptoms? Symptoms of this condition may include:  Sharp or dull pain in the lower back that does not go away. Pain may extend to the buttocks.  Stiffness.  Limited range of motion.  Inability to stand up straight due to stiffness or pain.  Muscle spasms. How is this diagnosed? This condition may be diagnosed based on:  Your symptoms.  Your medical history.  A physical exam. ? Your health care provider may push on certain areas of your back to determine the source of your pain. ? You may be asked to bend forward, backward, and side to side to assess the severity of your pain and your range of motion.  Imaging tests, such as: ? X-rays. ? MRI. How is this treated? Treatment for this condition may include:  Applying heat and cold to the affected area.  Medicines to help relieve pain and to relax your muscles (muscle relaxants).  NSAIDs to help reduce swelling and discomfort.  Physical therapy. When your symptoms improve, it is important to gradually return to your normal routine as soon as possible to reduce pain, avoid stiffness, and avoid loss of muscle strength. Generally, symptoms should improve within 6 weeks of treatment. However, recovery time varies. Follow these instructions at home: Managing pain, stiffness, and swelling   If directed, apply ice to the injured area during the first 24 hours after your injury. ? Put ice  in a plastic bag. ? Place a towel between your skin and the bag. ? Leave the ice on for 20 minutes, 2-3 times a day.  If directed, apply heat to the affected area as often as told by your health care provider. Use the heat source that your health care provider recommends, such as a moist heat pack or a heating pad. ? Place a towel between your skin and the heat source. ? Leave the heat on for 20-30 minutes. ?  Remove the heat if your skin turns bright red. This is especially important if you are unable to feel pain, heat, or cold. You may have a greater risk of getting burned. Activity  Rest and return to your normal activities as told by your health care provider. Ask your health care provider what activities are safe for you.  Avoid activities that take a lot of effort (are strenuous) for as long as told by your health care provider.  Do exercises as told by your health care provider. General instructions   Take over-the-counter and prescription medicines only as told by your health care provider.  If you have questions or concerns about safety while taking pain medicine, talk with your health care provider.  Do not drive or operate heavy machinery until you know how your pain medicine affects you.  Do not use any tobacco products, such as cigarettes, chewing tobacco, and e-cigarettes. Tobacco can delay bone healing. If you need help quitting, ask your health care provider.  Keep all follow-up visits as told by your health care provider. This is important. How is this prevented?  Warm up and stretch before being active.  Cool down and stretch after being active.  Give your body time to rest between periods of activity.  Avoid: ? Being physically inactive for long periods at a time. ? Exercising or playing sports when you are tired or in pain.  Use correct form when playing sports and lifting heavy objects.  Use good posture when sitting and standing.  Maintain a healthy weight.  Sleep on a mattress with medium firmness to support your back.  Make sure to use equipment that fits you, including shoes that fit well.  Be safe and responsible while being active to avoid falls.  Do at least 150 minutes of moderate-intensity exercise each week, such as brisk walking or water aerobics. Try a form of exercise that takes stress off your back, such as swimming or stationary cycling.   Maintain physical fitness, including: ? Strength. In particular, develop and maintain strong abdominal muscles. ? Flexibility. ? Cardiovascular fitness. ? Endurance. Contact a health care provider if:  Your back pain does not improve after 6 weeks of treatment.  Your symptoms get worse. Get help right away if:  Your back pain is severe.  You are unable to stand or walk.  You develop pain in your legs.  You develop weakness in your buttocks or legs.  You have difficulty controlling when you urinate or when you have a bowel movement. This information is not intended to replace advice given to you by your health care provider. Make sure you discuss any questions you have with your health care provider. Document Released: 07/01/2005 Document Revised: 03/07/2016 Document Reviewed: 04/12/2015 Elsevier Interactive Patient Education  2019 [employer].   Core Strength Exercises  Core exercises help to build strength in the muscles between your ribs and your hips (abdominal muscles). These muscles help to support your body and keep your spine stable.  It is important to maintain strength in your core to prevent injury and pain. Some activities, such as yoga and Pilates, can help to strengthen core muscles. You can also strengthen core muscles with exercises at home. It is important to talk to your health care provider before you start a new exercise routine. What are the benefits of core strength exercises? Core strength exercises can:  Reduce back pain.  Help to rebuild strength after a back or spine injury.  Help to prevent injury during physical activity, especially injuries to the back and knees. How to do core strength exercises Repeat these exercises 10-15 times, or until you are tired. Do exercises exactly as told by your health care provider and adjust them as directed. It is normal to feel mild stretching, pulling, tightness, or discomfort as you do these exercises. If you feel  any pain while doing these exercises, stop. If your pain continues or gets worse when doing core exercises, contact your health care provider. You may want to use a padded yoga or exercise mat for strength exercises that are done on the floor. Bridging  1. Lie on your back on a firm surface with your knees bent and your feet flat on the floor. 2. Raise your hips so that your knees, hips, and shoulders form a straight line together. Keep your abdominal muscles tight. 3. Hold this position for 3-5 seconds. 4. Slowly lower your hips to the starting position. 5. Let your muscles relax completely between repetitions. Single-leg bridge 1. Lie on your back on a firm surface with your knees bent and your feet flat on the floor. 2. Raise your hips so that your knees, hips, and shoulders form a straight line together. Keep your abdominal muscles tight. 3. Lift one foot off the floor, then completely straighten that leg. 4. Hold this position for 3-5 seconds. 5. Put the straight leg back down in the bent position. 6. Slowly lower your hips to the starting position. 7. Repeat these steps using your other leg. Side bridge 1. Lie on your side with your knees bent. Prop yourself up on the elbow that is near the floor. 2. Using your abdominal muscles and your elbow that is on the floor, raise your body off the floor. Raise your hip so that your shoulder, hip, and foot form a straight line together. 3. Hold this position for 10 seconds. Keep your head and neck raised and away from your shoulder (in their normal, neutral position). Keep your abdominal muscles tight. 4. Slowly lower your hip to the starting position. 5. Repeat and try to hold this position longer, working your way up to 30 seconds. Abdominal crunch 1. Lie on your back on a firm surface. Bend your knees and keep your feet flat on the floor. 2. Cross your arms over your chest. 3. Without bending your neck, tip your chin slightly toward your  chest. 4. Tighten your abdominal muscles as you lift your chest just high enough to lift your shoulder blades off of the floor. Do not hold your breath. You can do this with short lifts or long lifts. 5. Slowly return to the starting position. Bird dog 1. Get on your hands and knees, with your legs shoulder-width apart and your arms under your shoulders. Keep your back straight. 2. Tighten your abdominal muscles. 3. Raise one of your legs off the floor and straighten it. Try to keep it parallel to the floor. 4. Slowly lower your leg to the starting position.  5. Raise one of your arms off the floor and straighten it. Try to keep it parallel to the floor. 6. Slowly lower your arm to the starting position. 7. Repeat with the other arm and leg. If possible, try raising a leg and arm at the same time, on opposite sides of the body. For example, raise your left hand and your right leg. Plank 1. Lie on your belly. 2. Prop up your body onto your forearms and your feet, keeping your legs straight. Your body should make a straight line between your shoulders and feet. 3. Hold this position for 10 seconds while keeping your abdominal muscles tight. 4. Lower your body to the starting position. 5. Repeat and try to hold this position longer, working your way up to 30 seconds. Cross-core strengthening 1. Stand with your feet shoulder-width apart. 2. Hold a ball out in front of you. Keep your arms straight. 3. Tighten your abdominal muscles and slowly rotate at your waist from side to side. Keep your feet flat. 4. Once you are comfortable, try repeating this exercise with a heavier ball. Top core strengthening 1. Stand about 18 inches (46 cm) in front of a wall, with your back to the wall. 2. Keep your feet flat and shoulder-width apart. 3. Tighten your abdominal muscles. 4. Bend your hips and knees. 5. Slowly reach between your legs to touch the wall behind you. 6. Slowly stand back up. 7. Raise your  arms over your head and reach behind you. 8. Return to the starting position. General tips  Do not do any exercises that cause pain. If you have pain while exercising, talk to your health care provider.  Always stretch before and after doing these exercises. This can help prevent injury.  Maintain a healthy weight. Ask your health care provider what weight is healthy for you. Contact a health care provider if:  You have back pain that gets worse or does not go away.  You feel pain while doing core strength exercises. Get help right away if:  You have severe pain that does not get better with medicine. Summary  Core exercises help to build strength in the muscles between your ribs and your waist.  Core muscles help to support your body and keep your spine stable.  Some activities, such as yoga and Pilates, can help to strengthen core muscles.  Core strength exercises can help back pain and can prevent injury.  If you feel any pain while doing core strength exercises, stop. This information is not intended to replace advice given to you by your health care provider. Make sure you discuss any questions you have with your health care provider. Document Released: 11/20/2016 Document Revised: 11/20/2016 Document Reviewed: 11/20/2016 Elsevier Interactive Patient Education  2019 [employer].

## 2018-10-12 NOTE — Progress Notes (Signed)
Chief Complaint  Patient presents with  . Back Pain    lower left side. Originally started bothering her 10/03/2018. Eased up some this Sat. Then went to grocery store yesterday and bent over to pick up a bag of lettuce and made pain worse. Has tried aleve, advil and tylenol-none have helpled so she stopped.    Visit was performed virtually, due to the COVID-19 pandemic. Start time: 11:50 End time: 12:15  Set up as WebEx, but patient tried and was not able to get it working, unable to join the meeting, so visit was performed as a telephone visit.  She states she "threw" her back out 10/03/2018 when she tried lifting something heavy from her attic. The pain lasted for a week.  She reports she tried tylenol, advil, aleve, nothing helped, but then woke up Saturday (3/28) and it felt better. Yesterday she leaned forward to pick up a bag of lettuce at the store, and her pain recurred. Today, while just sitting, it started hurting again, causing her to break out into a sweat.  Heat helps, SalonPas has also helped some, temporarily.  Denies radiation of the pain. Denies numbness, tingling or weakness in the leg. No fever, chills, urinary or bowel problems.  PMH, [place] SH reviewed Outpatient Encounter Medications as of 10/12/2018  Medication Sig  . ALPRAZolam (XANAX) 0.25 MG tablet Take 1-2 tablets (0.25-0.5 mg total) by mouth 3 (three) times daily as needed for anxiety.  . Multiple Vitamin (MULTI VITAMIN PO) Take 1 tablet by mouth daily.  . Omega-3 Fatty Acids (OMEGA-3 FISH OIL PO) Take by mouth.  . zolpidem (AMBIEN) 5 MG tablet Take 0.5-1 tablets (2.5-5 mg total) by mouth at bedtime as needed for sleep.  . meloxicam (MOBIC) 15 MG tablet Take 1 tablet (15 mg total) by mouth daily. Stop when pain has completely resolved  . methocarbamol (ROBAXIN) 500 MG tablet Take 1-2 tablets (500-1,000 mg total) by mouth every 8 (eight) hours as needed for muscle spasms.   No facility-administered encounter  medications on file as of 10/12/2018.    (not taking meloxicam or robaxin prior to today's visit)  Allergies  Allergen Reactions  . Sulfa Antibiotics Swelling and Rash  . Zithromax [Azithromycin] Other (See Comments)    Abdominal pain/cramping    ROS: no fever, chills, URI symptoms, nausea, vomiting, bowel changes, bleeding, bruising, rash, urinary or bowel complaints.  +back pain per HPI.  No paresthesias, weakness, numbness or tingling.  PHYSICAL EXAM: Ht 5' 4.5" (1.638 m)   Wt 137 lb (62.1 kg)   BMI 23.15 kg/m  Exam limited, due to being a telephone encounter.   She sounds comfortable over the phone, in no distress. Normal speech, alert and oriented.  She reports her pain is in the lower left back, denies spinal pain. Negative SLR on the left (had her do over the phone). Pain only if she lifts leg up (thigh off the chair).   ASSESSMENT/PLAN:  Acute left-sided low back pain without sciatica - Plan: meloxicam (MOBIC) 15 MG tablet, methocarbamol (ROBAXIN) 500 MG tablet   Risks/SE of medications reviewed in detail. Discussed heat, massage, stretches, core strengthening and working on hamstring flexibility to prevent further back problems. Discussed next step of PT if not improving.  25" visit, performed via telephone due to patient being unable to join the WebEx meeting that was arranged. More than 1/2 was spent counseling (exercises, stretches, medications, next steps)    Take meloxicam once daily with food. This is an anti-inflammatory  medication.  If it bothers your stomach, cut the dose in half. Do NOT take ibuprofen, aleve, naproxen, advil, motrin, Goody or BC powder along with this medication. You CAN take acetaminophen (tylenol) safely along with the meloxicam. We are also prescribing a muscle relaxant, to use as needed for muscle spasm and pain.  Take 1-2 pills every 8 hours as needed. Continue to use moist heat, do some gentle stretches, and try massage. You can  continue the SalonPas patches.  If your pain isn't resolving with these measures, we can refer you to physical therapy.

## 2019-02-02 ENCOUNTER — Encounter: Payer: Self-pay | Admitting: Family Medicine

## 2019-02-02 DIAGNOSIS — G4700 Insomnia, unspecified: Secondary | ICD-10-CM

## 2019-02-02 MED ORDER — ZOLPIDEM TARTRATE 5 MG PO TABS: 2.5000 mg | ORAL_TABLET | Freq: Every evening | ORAL | 1 refills | Status: DC | PRN

## 2019-02-03 NOTE — Progress Notes (Signed)
Start time: 8:26 End time: 9:06  Virtual Visit via Video Note  I connected with Kristie Howell on 02/04/2019 by a video enabled telemedicine application and verified that I am speaking with the correct person using two identifiers.  Location: Patient: home, husband in another room Provider: office   I discussed the limitations of evaluation and management by telemedicine and the availability of in person appointments. The patient expressed understanding and agreed to proceed. She consents to insurance being filed for this visit.  History of Present Illness: Chief Complaint  Patient presents with  . Consult    restart lexapro due to anxiety increasing and hard to manage   Message from patient 02/02/2019, asking to discuss getting-- "back on Lexapro for my anxiety issues. I've tried to manage it without medication, and trying to exercise and stay busy, but I'm finding that it is taking over more and more and starts when I get up in the morning. Just like with every family right now, we have had a lot going on with job changes, family issues and health issues that has brought on more than normal stress. I remember you asking me once if I thought I needed a drug to take occasionally, like zanax, for anxiety or one that I take daily. I've thought about that a lot, and I think the answer right now is one to take daily to get me more on an even keel."  She previously was started on lexapro 5mg  in 11/2016, when she had worsened anxiety (related to husband's dx ofrecurrent thyroid cancer). At that time she reported ongoing anxiety, constant worry, some irritability. She was also given alprazolam for prn use. She reports that she did fine on the Lexapro, anxiety improved, and she weaned herself off in the Spring (09/2017). She had been doing well since then, and at her last wellness visit in February she was asking for something to have on hand just in case, not needing a daily medicine. She was given a  prescription for 0.25mg , with instructions she could take two if 0.25mg  wasn't effective. She reports she never picked this prescription up, still taking from the old bottle.  She has used alprazolam on occasion, and it does help, just afraid to use it often.  She reports increased anxiety since March, husband has been working from home, feels like she has to tiptoe around.   [place]off's at his son's office (so far has "made the cut"), helping to watch the grandkids. Daughter was pregnant, had baby in May, 2 yo sibling had COVID, recovered. Really misses going out to eat and socializing. Really got hard in June, recognizing that it isn't "going away", didn't think it would last this long.  PMH, PSH, SH reviewed  Current Outpatient Medications on File Prior to Visit  Medication Sig Dispense Refill  . Multiple Vitamin (MULTI VITAMIN PO) Take 1 tablet by mouth daily.    Marland Kitchen zolpidem (AMBIEN) 5 MG tablet Take 0.5-1 tablets (2.5-5 mg total) by mouth at bedtime as needed for sleep. 30 tablet 1  . ALPRAZolam (XANAX) 0.25 MG tablet Take 1-2 tablets (0.25-0.5 mg total) by mouth 3 (three) times daily as needed for anxiety. (Patient not taking: Reported on 02/04/2019) 30 tablet 0  . meloxicam (MOBIC) 15 MG tablet Take 1 tablet (15 mg total) by mouth daily. Stop when pain has completely resolved 15 tablet 0  . methocarbamol (ROBAXIN) 500 MG tablet Take 1-2 tablets (500-1,000 mg total) by mouth every 8 (eight) hours as  needed for muscle spasms. (Patient not taking: Reported on 02/04/2019) 20 tablet 0  . Omega-3 Fatty Acids (OMEGA-3 FISH OIL PO) Take by mouth.     No current facility-administered medications on file prior to visit.    Allergies  Allergen Reactions  . Sulfa Antibiotics Swelling and Rash  . Zithromax [Azithromycin] Other (See Comments)    Abdominal pain/cramping   ROS: no fever, chills, URI symptoms, headaches, dizziness, chest pain, GI complaints, weight changes.  +anxiety, trouble falling  asleep. See HPI.    Observations/Objective:  Temp 97.8 F (36.6 C) (Oral)   Ht 5\' 4"  (1.626 m)   Wt 137 lb (62.1 kg)   BMI 23.52 kg/m   Wt Readings from Last 3 Encounters:  10/12/18 137 lb (62.1 kg)  09/02/18 138 lb 6.4 oz (62.8 kg)  06/16/17 137 lb 9.6 oz (62.4 kg)   Well-appearing, pleasant female, in good spirits. She is alert, oriented. She is well-dressed, wearing makeup, normal grooming Mood is anxious, full range of affect. Normal eye contact and speech.  GAD-7 score of 14  Assessment and Plan:  Generalized anxiety disorder - start Lexapro; encouraged counseling. Discussed relaxation techniques/mindfulness - Plan: escitalopram (LEXAPRO) 5 MG tablet  F/u 6 weeks, sooner prn   Start lexapro at 1/2 tablet by mouth once daily.  If no side effects, you can increase up to the full tablet after just a few days.  If you don't notice any significant response to the 5mg  after a couple of weeks, you can further titrate up (by taking 1.5 tablets for a week, and then up to 2 tablets (10mg )). We will follow up in 6 weeks, but feel free to reach out sooner. You may continue to use the alprazolam as needed (and expect the possibility of slight increased anxiety temporarily as we start and increase the dose of the lexapro. Check with your pharmacy--we did write a prescription for #30 of alprazolam at your February 2020 visit. If they won't fill it (and you never picked it up), let me know and I'll send another.    Follow Up Instructions:    I discussed the assessment and treatment plan with the patient. The patient was provided an opportunity to ask questions and all were answered. The patient agreed with the plan and demonstrated an understanding of the instructions.   The patient was advised to call back or seek an in-person evaluation if the symptoms worsen or if the condition fails to improve as anticipated.  I provided 40 minutes of non-face-to-face time during this  encounter.   Vikki Ports, MD

## 2019-02-04 ENCOUNTER — Encounter: Payer: Self-pay | Admitting: Family Medicine

## 2019-02-04 ENCOUNTER — Other Ambulatory Visit: Payer: Self-pay

## 2019-02-04 ENCOUNTER — Ambulatory Visit (INDEPENDENT_AMBULATORY_CARE_PROVIDER_SITE_OTHER): Payer: 59 | Admitting: Family Medicine

## 2019-02-04 VITALS — Temp 97.8°F | Ht 64.0 in | Wt 137.0 lb

## 2019-02-04 DIAGNOSIS — F411 Generalized anxiety disorder: Secondary | ICD-10-CM

## 2019-02-04 MED ORDER — ESCITALOPRAM OXALATE 5 MG PO TABS: ORAL_TABLET | ORAL | 1 refills | Status: DC

## 2019-02-04 NOTE — Progress Notes (Signed)
Called and left VM for pt

## 2019-02-04 NOTE — Patient Instructions (Signed)
Consider reaching out to a therapist for counseling. Work on breathing techniques and visualization in the evenings, as well as other calming measures (warm bath, herbal tea, lavendar essential oil).  Start lexapro at 1/2 tablet by mouth once daily.  If no side effects, you can increase up to the full tablet after just a few days.  If you don't notice any significant response to the 5mg  after a couple of weeks, you can further titrate up (by taking 1.5 tablets for a week, and then up to 2 tablets (10mg )). We will follow up in 6 weeks, but feel free to reach out sooner. You may continue to use the alprazolam as needed (and expect the possibility of slight increased anxiety temporarily as we start and increase the dose of the lexapro. Check with your pharmacy--we did write a prescription for #30 of alprazolam at your February 2020 visit. If they won't fill it (and you never picked it up), let me know and I'll send another.   Mindfulness-Based Stress Reduction Mindfulness-based stress reduction (MBSR) is a program that helps people learn to practice mindfulness. Mindfulness is the practice of intentionally paying attention to the present moment. It can be learned and practiced through techniques such as education, breathing exercises, meditation, and yoga. MBSR includes several mindfulness techniques in one program. MBSR works best when you understand the treatment, are willing to try new things, and can commit to spending time practicing what you learn. MBSR training may include learning about:  How your emotions, thoughts, and reactions affect your body.  New ways to respond to things that cause negative thoughts to start (triggers).  How to notice your thoughts and let go of them.  Practicing awareness of everyday things that you normally do without thinking.  The techniques and goals of different types of meditation. What are the benefits of MBSR? MBSR can have many benefits, which include  helping you to:  Develop self-awareness. This refers to knowing and understanding yourself.  Learn skills and attitudes that help you to participate in your own health care.  Learn new ways to care for yourself.  Be more accepting about how things are, and let things go.  Be less judgmental and approach things with an open mind.  Be patient with yourself and trust yourself more. MBSR has also been shown to:  Reduce negative emotions, such as depression and anxiety.  Improve memory and focus.  Change how you sense and approach pain.  Boost your body's ability to fight infections.  Help you connect better with other people.  Improve your sense of well-being. Follow these instructions at home:   Find a local in-person or online MBSR program.  Set aside some time regularly for mindfulness practice.  Find a mindfulness practice that works best for you. This may include one or more of the following: ? Meditation. Meditation involves focusing your mind on a certain thought or activity. ? Breathing awareness exercises. These help you to stay present by focusing on your breath. ? Body scan. For this practice, you lie down and pay attention to each part of your body from head to toe. You can identify tension and soreness and intentionally relax parts of your body. ? Yoga. Yoga involves stretching and breathing, and it can improve your ability to move and be flexible. It can also provide an experience of testing your body's limits, which can help you release stress. ? Mindful eating. This way of eating involves focusing on the taste, texture, color, and  smell of each bite of food. Because this slows down eating and helps you feel full sooner, it can be an important part of a weight-loss plan.  Find a podcast or recording that provides guidance for breathing awareness, body scan, or meditation exercises. You can listen to these any time when you have a free moment to rest without  distractions.  Follow your treatment plan as told by your health care provider. This may include taking regular medicines and making changes to your diet or lifestyle as recommended. How to practice mindfulness To do a basic awareness exercise:  Find a comfortable place to sit.  Pay attention to the present moment. Observe your thoughts, feelings, and surroundings just as they are.  Avoid placing judgment on yourself, your feelings, or your surroundings. Make note of any judgment that comes up, and let it go.  Your mind may wander, and that is okay. Make note of when your thoughts drift, and return your attention to the present moment. To do basic mindfulness meditation:  Find a comfortable place to sit. This may include a stable chair or a firm floor cushion. ? Sit upright with your back straight. Let your arms fall next to your side with your hands resting on your legs. ? If sitting in a chair, rest your feet flat on the floor. ? If sitting on a cushion, cross your legs in front of you.  Keep your head in a neutral position with your chin dropped slightly. Relax your jaw and rest the tip of your tongue on the roof of your mouth. Drop your gaze to the floor. You can close your eyes if you like.  Breathe normally and pay attention to your breath. Feel the air moving in and out of your nose. Feel your belly expanding and relaxing with each breath.  Your mind may wander, and that is okay. Make note of when your thoughts drift, and return your attention to your breath.  Avoid placing judgment on yourself, your feelings, or your surroundings. Make note of any judgment or feelings that come up, let them go, and bring your attention back to your breath.  When you are ready, lift your gaze or open your eyes. Pay attention to how your body feels after the meditation. Where to find more information You can find more information about MBSR from:  Your health care provider.  Community-based  meditation centers or programs.  Programs offered near you. Summary  Mindfulness-based stress reduction (MBSR) is a program that teaches you how to intentionally pay attention to the present moment. It is used with other treatments to help you cope better with daily stress, emotions, and pain.  MBSR focuses on developing self-awareness, which allows you to respond to life stress without judgment or negative emotions.  MBSR programs may involve learning different mindfulness practices, such as breathing exercises, meditation, yoga, body scan, or mindful eating. Find a mindfulness practice that works best for you, and set aside time for it on a regular basis. This information is not intended to replace advice given to you by your health care provider. Make sure you discuss any questions you have with your health care provider. Document Released: 11/07/2016 Document Revised: 06/13/2017 Document Reviewed: 11/07/2016 Elsevier Patient Education  2020 [employer].

## 2019-02-09 NOTE — Progress Notes (Signed)
done

## 2019-03-17 DIAGNOSIS — F411 Generalized anxiety disorder: Secondary | ICD-10-CM

## 2019-03-23 NOTE — Progress Notes (Signed)
Start time: 11:45 End time: 12:10  Virtual Visit via Video Note  I connected with Kristie Howell on 03/23/19  by a video enabled telemedicine application and verified that I am speaking with the correct person using two identifiers.  Location: Patient:  Home, alone Provider: office   I discussed the limitations of evaluation and management by telemedicine and the availability of in person appointments. The patient expressed understanding and agreed to proceed. She consents to insurance being filed for this visit.   History of Present Illness:   Chief Complaint  Patient presents with  . Anxiety    follow up on anxiety. Would like to come today for flu shot. Has noticed more bruising since taking lexapro.     Patient presents to follow-up on anxiety.  At her July 2020 visit she reported: "increased anxiety since March, husband has been working from home, feels like she has to tiptoe around.   Layoff's at his son's office (so far has "made the cut"), helping to watch the grandkids. Daughter was pregnant, had baby in May, 2 yo sibling had COVID, recovered. Really misses going out to eat and socializing. Really got hard in June, recognizing that it isn't "going away", didn't think it would last this long."  GAD-7 score was 14  She was restarted on lexapro 5mg .  We discussed counseling and stress-reduction/anxiety-reducing techniques.  She was reminded that she had rx for alprazolam (at pharmacy if she had never picked it up).  "I think I'm doing much better" She had some stomach issues just at first, resolved.  She feels calmer, husband has noticed a difference.    Son ended up losing his job, got another one.  Medication helped her handle things much better.  She still worries about things, but worry is not taking over her life.  She is getting out and doing things, as this makes her feel much better.  She dwells on things when she is stuck in her house all the time. She reports being  careful, wearing mask. Still has some irritability (mostly related to her husband working at home, working too much) She is able to relax a lot better, her mind isn't racing as much when she goes to bed.  Has needed ambien sometimes, but much less. She filled the xanax, but hasn't needed to take it.  She has noticed easy bruising since being on the medication.  Mainly on her legs, after bumping them.  No bleeding noted.   Outpatient Encounter Medications as of 03/24/2019  Medication Sig  . escitalopram (LEXAPRO) 5 MG tablet Start 1/2 tablet by mouth once daily, increase to full tab after 4-7 days, and may further titrate up to 2 tablets over the next 3-4 wks.  . Multiple Vitamin (MULTI VITAMIN PO) Take 1 tablet by mouth daily.  Marland Kitchen zolpidem (AMBIEN) 5 MG tablet Take 0.5-1 tablets (2.5-5 mg total) by mouth at bedtime as needed for sleep.  Marland Kitchen ALPRAZolam (XANAX) 0.25 MG tablet Take 1-2 tablets (0.25-0.5 mg total) by mouth 3 (three) times daily as needed for anxiety. (Patient not taking: Reported on 02/04/2019)  . [DISCONTINUED] meloxicam (MOBIC) 15 MG tablet Take 1 tablet (15 mg total) by mouth daily. Stop when pain has completely resolved  . [DISCONTINUED] methocarbamol (ROBAXIN) 500 MG tablet Take 1-2 tablets (500-1,000 mg total) by mouth every 8 (eight) hours as needed for muscle spasms. (Patient not taking: Reported on 02/04/2019)  . [DISCONTINUED] Omega-3 Fatty Acids (OMEGA-3 FISH OIL PO) Take by mouth.  No facility-administered encounter medications on file as of 03/24/2019.     Allergies  Allergen Reactions  . Sulfa Antibiotics Swelling and Rash  . Zithromax [Azithromycin] Other (See Comments)    Abdominal pain/cramping   ROS:  No fever, chills, URI symptoms, headaches, dizziness, chest pain, nausea, vomiting, diarrhea.  Anxiety and insomnia are improved, still has some worries and anxiety per HPI and GAD-7 screening. Easy bruising, per HPI.   Observations/Objective:  BP 111/75   Pulse 97    Ht 5\' 4"  (1.626 m)   Wt 137 lb (62.1 kg)   BMI 23.52 kg/m  Wt Readings from Last 3 Encounters:  03/24/19 137 lb (62.1 kg)  02/04/19 137 lb (62.1 kg)  10/12/18 137 lb (62.1 kg)   Well-appearing, very pleasant female, in good spirits, in no distress. She has normal mood, affect, grooming. Normal eye contact and speech. She is alert, oriented.  GAD-7 score of 6 (down from 14)  Assessment and Plan:  Generalized anxiety disorder - significantly improved--I suspect she would benefit from titrating up the dose, she is hesitant, may consider in winter - Plan: escitalopram (LEXAPRO) 5 MG tablet  Need for influenza vaccination - Plan: Flu Vaccine QUAD High Dose(Fluad)  Need for pneumococcal vaccination - Plan: Pneumococcal conjugate vaccine 13-valent   She prefers to stay on the 5mg  dose for now, will consider increasing by 1/2 tablet if needed, most likely in the winter, when her moods tend to get a little worse.  Her concern is the bruising.  She will consider counseling, is "keeping it in the back of her mind."  Needs flu and prevnar-13, will come to office later today for these vaccines.  F/u as scheduled in 08/2019, sooner prn.   Follow Up Instructions:    I discussed the assessment and treatment plan with the patient. The patient was provided an opportunity to ask questions and all were answered. The patient agreed with the plan and demonstrated an understanding of the instructions.   The patient was advised to call back or seek an in-person evaluation if the symptoms worsen or if the condition fails to improve as anticipated.  I provided 25 minutes of non-face-to-face time during this encounter.   Vikki Ports, MD

## 2019-03-24 ENCOUNTER — Ambulatory Visit (INDEPENDENT_AMBULATORY_CARE_PROVIDER_SITE_OTHER): Payer: 59 | Admitting: Family Medicine

## 2019-03-24 ENCOUNTER — Encounter: Payer: Self-pay | Admitting: Family Medicine

## 2019-03-24 ENCOUNTER — Other Ambulatory Visit: Payer: Self-pay

## 2019-03-24 VITALS — BP 111/75 | HR 97 | Ht 64.0 in | Wt 137.0 lb

## 2019-03-24 DIAGNOSIS — Z23 Encounter for immunization: Secondary | ICD-10-CM

## 2019-03-24 DIAGNOSIS — F411 Generalized anxiety disorder: Secondary | ICD-10-CM

## 2019-03-24 MED ORDER — ESCITALOPRAM OXALATE 5 MG PO TABS: 5.0000 mg | ORAL_TABLET | Freq: Every day | ORAL | 1 refills | Status: DC

## 2019-03-24 NOTE — Patient Instructions (Addendum)
Today we discussed that your anxiety is much better since being on the Lexapro. You still have some mild anxiety, and likely could benefit from titrating up the dose. We discussed increasing by 1/2 tablet (and if needed, could further increase to a total dose of 10mg ). You were hesitant to do so today, mainly due to concern of the bruising.  If your moods (depression or anxiety) worsen, as you mentioned sometimes happens in the winter, then feel free to increase it on your own. Give me a heads up so that I know you've increased it, so we can arrange to follow up (and because you will need refill earlier, with change in the prescription quantity to accommodate the higher dose).  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 It is okay to be out, but please continue to try and be very safe, to avoid COVID. (as we discussed--best to be outside, wearing masks when not eating, if in close proximity to others not in your household, etc).  I'm so glad that you are feeling better.  Please feel free to contact me at any time if you have any questions/concerns. If you're doing well, I'll see you in February!

## 2019-06-02 ENCOUNTER — Other Ambulatory Visit: Payer: Self-pay | Admitting: Family Medicine

## 2019-06-02 DIAGNOSIS — G4700 Insomnia, unspecified: Secondary | ICD-10-CM

## 2019-06-02 NOTE — Telephone Encounter (Signed)
Is this okay to refill? 

## 2019-08-16 ENCOUNTER — Other Ambulatory Visit: Payer: Self-pay | Admitting: Family Medicine

## 2019-08-16 DIAGNOSIS — G4700 Insomnia, unspecified: Secondary | ICD-10-CM

## 2019-08-16 NOTE — Telephone Encounter (Signed)
Is this okay to refill? 

## 2019-09-07 NOTE — Patient Instructions (Addendum)
HEALTH MAINTENANCE RECOMMENDATIONS:  It is recommended that you get at least 30 minutes of aerobic exercise at least 5 days/week (for weight loss, you may need as much as 60-90 minutes). This can be any activity that gets your heart rate up. This can be divided in 10-15 minute intervals if needed, but try and build up your endurance at least once a week.  Weight bearing exercise is also recommended twice weekly.  Eat a healthy diet with lots of vegetables, fruits and fiber.  "Colorful" foods have a lot of vitamins (ie green vegetables, tomatoes, red peppers, etc).  Limit sweet tea, regular sodas and alcoholic beverages, all of which has a lot of calories and sugar.  Up to 1 alcoholic drink daily may be beneficial for women (unless trying to lose weight, watch sugars).  Drink a lot of water.  Calcium recommendations are 1200-1500 mg daily (1500 mg for postmenopausal women or women without ovaries), and vitamin D 1000 IU daily.  This should be obtained from diet and/or supplements (vitamins), and calcium should not be taken all at once, but in divided doses.  Monthly self breast exams and yearly mammograms for women over the age of 62 is recommended.  Sunscreen of at least SPF 30 should be used on all sun-exposed parts of the skin when outside between the hours of 10 am and 4 pm (not just when at beach or pool, but even with exercise, golf, tennis, and yard work!)  Use a sunscreen that says "broad spectrum" so it covers both UVA and UVB rays, and make sure to reapply every 1-2 hours.  Remember to change the batteries in your smoke detectors when changing your clock times in the spring and fall. Carbon monoxide detectors are recommended for your home.  Use your seat belt every time you are in a car, and please drive safely and not be distracted with cell phones and texting while driving.  I recommend getting the new shingles vaccine (Shingrix). You will need to check with your insurance to see if it  is covered, and if covered by Medicare Part D, you need to get from the pharmacy rather than our office.  It is a series of 2 injections, spaced 2 months apart. You will need to get it from the pharmacy once on Medicare, can get it in our office if you have commercial insurance. You need to wait 4 weeks after any other vaccine before getting Shingrix.  I also recommend that you get COVID-19 vaccine.  You need to wait 2 weeks after any other vaccine before getting the COVID vaccine.  To schedule appointments for COVID vaccine, you can look into [email or web address], and also through [employer].  You can also check through [facility] and [facility].  COVID-19 Vaccine Information can be found at: [email or web address] For questions related to vaccine distribution or appointments, please email [email or web address] or call [phone].   You will be due for pneumovax in September (2nd pneumonia vaccine, you got prevnar-13 last year).  Continue yearly high dose flu shots in the Fall.   Please call the breast center and schedule BOTH your mammogram (okay to pre-medicate with alprazolam, but have someone drive you there) and your bone density test for the same day.  Please get [place] a copy of your living will and healthcare power of attorney to be scanned into your chart.  Increase your lexapro to 1.5 tablets for a week.  If tolerated, further increase to 2 tablets, and let us know if you aren't  tolerating that.  It takes up to 6 weeks to see the full effect (benefit on moods), but if you are tolerating it, I can change the prescription to a 10mg  tablet.  Try stopping the yogurt for a week to see if your gas is related to that (vs if your gas gets worse with the higher dose lexapro).

## 2019-09-07 NOTE — Progress Notes (Signed)
Chief Complaint  Patient presents with  . Annual Exam    fasting annual exam with pelvic. No new concerns.     Kristie Howell is a 69 y.o. female who presents for a complete physical.   She has the following concerns:  Follow up on Anxiety: Seen in July with worsening anxiety during the pandemic, after being off Lexapro.  GAD-7 score at that time was 14.  She was restarted on lexapro 30m and at follow-up visit in 03/2019 she reported she was doing much better. GAD-7score at that time was 6.  She had stated she had some stomach issues just at first, resolved. She felt calmer, husband has noticed a difference.  She worries still, but didn't let the worry take over her life.  She does better when she can get out of the house, dwells on thing more when stuck at home.  She was able to relax more and sleep better.  (not the case now, see below).  She preferred to stay on the 531mdose, but we discussed her considering increasing by 1/2 tablet if needed during the winter when her moods tend to get a little worse.  Her concern was the bruising she had noticed since starting the lexapro (mainly on legs, after bumping them, no bleeding). She also said at that time she would consider counseling, is "keeping it in the back of her mind."  Today she reports she is doing "okay", admits that she had a harder time in the winter, but has managed, didn't want to go up on the lexapro.  Symptoms aren't daily, just "some bad days". She reports having "really bad gas" from the lexapro,which is why she hasn't gone up on the dose (no longer complaining of bruising). Has been eating yogurt daily which is new, no other changes in her diet (as cause for gas).  She has been struggling with sleep. She is having trouble falling asleep. She can't shut down her brain.  She now is anxious about whether or not she can sleep. She has ambien to use prn.  This was recently refilled (#30 on 2/1, prior to that was filled 11/19 #30).  She  uses 1/2 tablet or less, needing to use it 3-4 nights/week. Melatonin wasn't effective. Benadryl makes her hyper.  She needs refill on alprazolam--rarely uses it, should have used it yesterday for dental work, but didn't and regrets that.  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 There was microinvasion on the left side.She last saw Dr. GeAlycia Rossettin June 2018, and after CA 125 was normal on 5 year follow-up, she was released from her care. Pt was told to continue to have Ca-125 monitored. Last done 09/2018 and was normal at 15.6. She denies any pelvic pain, bleeding or other GYN concerns.  She no longer sees a gynecologist (they have all retired). She does do self breast exams, notes some chronic mild tenderness in the left breast, unchanged, no other breast issues.  Vitamin D was checked 01/2017, it was normal at 33; she was taking MVI daily at that time. She is currently taking  She just bought vitamin D3 1000 IU and zinc, and wonders what she should take (hasn't started).  Immunization History  Administered Date(s) Administered  . Fluad Quad(high Dose 65+) 03/24/2019  . Influenza Split 05/05/2013  . Influenza,inj,Quad PF,6+ Mos 05/16/2014, 04/10/2017  . Influenza-Unspecified 05/05/2018  . Pneumococcal Conjugate-13 03/24/2019  . Tdap 03/23/2007, 11/18/2016  .  Zoster 03/28/2013   Last Pap smear: 07/2016 through Dr. Alycia Rossetti Last mammogram: 05/2013 (knows she is past due, too anxious to get) Last colonoscopy:03/2016, due again 2022 (Dr. Watt Climes) due to family history Last DEXA: never (ordered last year, not scheduled) Ophtho: yearly  Dentist: every 6 months Exercise:Walks with husband in the afternoons 45 minutes 3-4x/week. Uses some 2# hand weights and does squats.  Lipid screen: Lab Results  Component Value Date   CHOL 229 (H) 09/16/2018   HDL 76 09/16/2018   LDLCALC 136 (H) 09/16/2018   TRIG 87 09/16/2018    CHOLHDL 3.0 09/16/2018    Other doctors caring for patient include:  GYN-onc Dr. Alycia Rossetti (no longer sees) GYN (Was Dr. Cherylann Banas, retired) Dentist: Dr. Daneil Dolin Ophtho: Dr. Vesta Mixer: [facility] [facility] GI: Dr Watt Climes   Depression screen:  negative Fall Screen negative Functional status survey: unremarkable Mini-Cog screen normal See full screens in Epic   End of Life Discussion:  Patient has a living will and medical power of attorney. She was asked to get [place] a copy   PMH, PSH, SH and FH were reviewed and updated  Outpatient Encounter Medications as of 09/08/2019  Medication Sig  . escitalopram (LEXAPRO) 5 MG tablet Take 1 tablet (5 mg total) by mouth daily.  . Multiple Vitamin (MULTI VITAMIN PO) Take 1 tablet by mouth daily.  Marland Kitchen zolpidem (AMBIEN) 5 MG tablet TAKE 1/2 TO 1 TABLET AT BEDTIME AS NEEDED FOR SLEEP  . ALPRAZolam (XANAX) 0.25 MG tablet Take 1-2 tablets (0.25-0.5 mg total) by mouth 3 (three) times daily as needed for anxiety. (Patient not taking: Reported on 09/08/2019)   No facility-administered encounter medications on file as of 09/08/2019.   Allergies  Allergen Reactions  . Sulfa Antibiotics Swelling and Rash  . Zithromax [Azithromycin] Other (See Comments)    Abdominal pain/cramping     ROS: The patient denies anorexia, fever, weight changes(slight gain since less active), headaches, vision changes, decreased hearing, ear pain, sore throat, breast concerns, chest pain, palpitations, dizziness, syncope, dyspnea on exertion, cough, swelling, nausea, vomiting, diarrhea, constipation, abdominal pain, melena, hematochezia, indigestion/heartburn, hematuria, incontinence, dysuria, vaginal bleeding, discharge, odor or itch, genital lesions; no numbness, tingling, weakness, tremor, suspicious skin lesions, abnormal bleeding/bruising, or enlarged lymph nodes. Intermittent, mild palpitations (chronic), mostly at night, when anxious. Some arthritis developing in her  fingers. Hand stiffness isn't as bad as in the past. +anxiety per HPI, and insomnia +vaginal dryness and discomfort with intercourse Mild hot flashes at night, only rarely   PHYSICAL EXAM:  BP 128/76   Pulse 80   Temp (!) 96.9 F (36.1 C) (Other (Comment))   Ht 5' 4.5" (1.638 m)   Wt 141 lb (64 kg)   BMI 23.83 kg/m   Wt Readings from Last 3 Encounters:  09/08/19 141 lb (64 kg)  03/24/19 137 lb (62.1 kg)  02/04/19 137 lb (62.1 kg)    General Appearance:   Alert, cooperative, no distress, appears stated age, in good spirits  Head:   Normocephalic, without obvious abnormality, atraumatic  Eyes:   PERRL, conjunctiva/corneas clear, EOM's intact, fundi benign  Ears:   Normal TM's and external ear canals  Nose:  Not examined, wearing mask due to COVID-19 pandemic  Throat:  Not examined, wearing mask due to COVID-19 pandemic  Neck:  Supple, no lymphadenopathy; thyroid: noenlargement/ tenderness/nodules; no carotid bruit or JVD  Back:  Spine nontender, no curvature, ROM normal, noCVA tenderness.  Lungs:   Clear to auscultation bilaterally without wheezes,  rales or ronchi; respirations unlabored  Chest Wall:   No tenderness or deformity  Heart:   Regular rate and rhythm, S1 and S2 normal, no murmur, rub  or gallop  Breast Exam:   No nipple inversion, discharge, skin dimpling or masses. No axillary lymphadenopathy  Abdomen:   Soft, non-tender, nondistended, normoactive bowel sounds, no masses, no hepatosplenomegaly. WHSS  Genitalia:   Normal external genitalia, without lesions. No abnormal vaginal discharge. Uterus is surgically absent.  No adnexal masses or tenderness  Rectal:  Normal sphincter tone, no masses.  Heme negative light brown stool  Extremities:  No clubbing, cyanosis or edema.Left 2nd and third fingers have mild bonyhypertrophyat the DIP joints, and arthritic changes of L 5th digit as well.    Pulses:  2+ and symmetric all extremities  Skin:  Skin color, texture, turgor normal, no rashes or lesions.  Lymph nodes:  Cervical, supraclavicular, and axillary nodes normal  Neurologic:  CNII-XII intact, normal strength, sensation and gait; reflexes 2+ and symmetric throughout; mild delayed relaxation noted at patellar reflexes bilaterally  Psych: Normal mood, affect, hygiene and grooming  PHQ-9 score of 4 (trouble sleeping, and moods related to all the recent rain) GAD-7 score of 6    ASSESSMENT/PLAN:  Increase your lexapro to 1.5 tablets for a week.  If tolerated, further increase to 2 tablets, and let us know if you aren't tolerating that.  It takes up to 6 weeks to see the full effect (benefit on moods), but if you are tolerating it, I can change the prescription to a 30m tablet.  Try stopping the yogurt for a week to see if your gas is related to that (vs if your gas gets worse with the higher dose lexapro).  Consider zoloft or paxil if not tolerating lexapro.   Pneumovax--due in September Shingrix recommended, COVID-19 vaccine recommended. Risks/SE and timing issues all reviewed.   Ca 125, c-met, CBC, consider D (last level 33 in July 2018, likely lower now) TSH requested to be checked by pt.   Past due for mammo and DEXA (ordered last year, not done, doesn't expire until 10/2019).  Asked to schedule, can do on same day, can take alprazolam and have husband drive her. Encouraged monthly self breast exams; at least 30 minutes of aerobic activity at least 5 days/week, weight-bearing exercise at least 2x/week; proper sunscreen use reviewed; healthy diet, including goals of calcium and vitamin D intake and alcohol recommendations (less than or equal to 1 drink/day) reviewed; regular seatbelt use; changing batteries in smoke detectors. Immunization recommendations discussed--continue yearly flu shots.  Pneumovax is due in 03/2020. Shingrix is  recommended, risks, SE reviewed; needs to check insurance, then schedule NV (needs to get from pharmacy when on Medicare).  COVID-19 vaccine recommended and discussed how to get. Timing of vaccines reviewed. Colonoscopy recommendations reviewed--dueagain 03/2021   F/u 6 wks on anxiety and insomnia  Over 60 mins FTF time spent with pt, plus additional time reviewing chart and documentation.

## 2019-09-08 ENCOUNTER — Ambulatory Visit (INDEPENDENT_AMBULATORY_CARE_PROVIDER_SITE_OTHER): Payer: BC Managed Care – PPO | Admitting: Family Medicine

## 2019-09-08 ENCOUNTER — Other Ambulatory Visit: Payer: Self-pay

## 2019-09-08 ENCOUNTER — Encounter: Payer: Self-pay | Admitting: Family Medicine

## 2019-09-08 VITALS — BP 128/76 | HR 80 | Temp 96.9°F | Ht 64.5 in | Wt 141.0 lb

## 2019-09-08 DIAGNOSIS — D3910 Neoplasm of uncertain behavior of unspecified ovary: Secondary | ICD-10-CM

## 2019-09-08 DIAGNOSIS — E559 Vitamin D deficiency, unspecified: Secondary | ICD-10-CM | POA: Diagnosis not present

## 2019-09-08 DIAGNOSIS — G4700 Insomnia, unspecified: Secondary | ICD-10-CM | POA: Diagnosis not present

## 2019-09-08 DIAGNOSIS — Z7189 Other specified counseling: Secondary | ICD-10-CM

## 2019-09-08 DIAGNOSIS — R635 Abnormal weight gain: Secondary | ICD-10-CM | POA: Diagnosis not present

## 2019-09-08 DIAGNOSIS — Z0000 Encounter for general adult medical examination without abnormal findings: Secondary | ICD-10-CM | POA: Diagnosis not present

## 2019-09-08 DIAGNOSIS — Z780 Asymptomatic menopausal state: Secondary | ICD-10-CM

## 2019-09-08 DIAGNOSIS — Z7185 Encounter for immunization safety counseling: Secondary | ICD-10-CM

## 2019-09-08 DIAGNOSIS — F411 Generalized anxiety disorder: Secondary | ICD-10-CM

## 2019-09-08 LAB — POCT URINALYSIS DIP (PROADVANTAGE DEVICE)
Bilirubin, UA: NEGATIVE
Blood, UA: NEGATIVE
Glucose, UA: NEGATIVE mg/dL
Ketones, POC UA: NEGATIVE mg/dL
Leukocytes, UA: NEGATIVE
Nitrite, UA: NEGATIVE
Protein Ur, POC: NEGATIVE mg/dL
Specific Gravity, Urine: 1.01
Urobilinogen, Ur: NEGATIVE
pH, UA: 6.5 (ref 5.0–8.0)

## 2019-09-08 MED ORDER — ESCITALOPRAM OXALATE 5 MG PO TABS: ORAL_TABLET | ORAL | 0 refills | Status: DC

## 2019-09-08 MED ORDER — ALPRAZOLAM 0.25 MG PO TABS: 0.2500 mg | ORAL_TABLET | Freq: Three times a day (TID) | ORAL | 0 refills | Status: DC | PRN

## 2019-09-09 LAB — LIPID PANEL
Chol/HDL Ratio: 3.2 ratio (ref 0.0–4.4)
Cholesterol, Total: 222 mg/dL — ABNORMAL HIGH (ref 100–199)
HDL: 70 mg/dL (ref >39)
LDL Chol Calc (NIH): 126 mg/dL — ABNORMAL HIGH (ref 0–99)
Triglycerides: 150 mg/dL — ABNORMAL HIGH (ref 0–149)
VLDL Cholesterol Cal: 26 mg/dL (ref 5–40)

## 2019-09-09 LAB — COMPREHENSIVE METABOLIC PANEL
ALT: 19 IU/L (ref 0–32)
AST: 23 IU/L (ref 0–40)
Albumin/Globulin Ratio: 1.8 (ref 1.2–2.2)
Albumin: 4.6 g/dL (ref 3.8–4.8)
Alkaline Phosphatase: 97 IU/L (ref 39–117)
BUN/Creatinine Ratio: 10 — ABNORMAL LOW (ref 12–28)
BUN: 8 mg/dL (ref 8–27)
Bilirubin Total: 0.8 mg/dL (ref 0.0–1.2)
CO2: 25 mmol/L (ref 20–29)
Calcium: 9.2 mg/dL (ref 8.7–10.3)
Chloride: 100 mmol/L (ref 96–106)
Creatinine, Ser: 0.77 mg/dL (ref 0.57–1.00)
GFR calc Af Amer: 93 mL/min/{1.73_m2} (ref >59)
GFR calc non Af Amer: 81 mL/min/{1.73_m2} (ref >59)
Globulin, Total: 2.6 g/dL (ref 1.5–4.5)
Glucose: 90 mg/dL (ref 65–99)
Potassium: 3.8 mmol/L (ref 3.5–5.2)
Sodium: 139 mmol/L (ref 134–144)
Total Protein: 7.2 g/dL (ref 6.0–8.5)

## 2019-09-09 LAB — CA 125: Cancer Antigen (CA) 125: 18.3 U/mL (ref 0.0–38.1)

## 2019-09-09 LAB — CBC WITH DIFFERENTIAL/PLATELET
Basophils Absolute: 0 10*3/uL (ref 0.0–0.2)
Basos: 1 %
EOS (ABSOLUTE): 0.1 10*3/uL (ref 0.0–0.4)
Eos: 2 %
Hematocrit: 43.5 % (ref 34.0–46.6)
Hemoglobin: 15 g/dL (ref 11.1–15.9)
Immature Grans (Abs): 0 10*3/uL (ref 0.0–0.1)
Immature Granulocytes: 0 %
Lymphocytes Absolute: 1.5 10*3/uL (ref 0.7–3.1)
Lymphs: 24 %
MCH: 31.4 pg (ref 26.6–33.0)
MCHC: 34.5 g/dL (ref 31.5–35.7)
MCV: 91 fL (ref 79–97)
Monocytes Absolute: 0.3 10*3/uL (ref 0.1–0.9)
Monocytes: 6 %
Neutrophils Absolute: 4 10*3/uL (ref 1.4–7.0)
Neutrophils: 67 %
Platelets: 205 10*3/uL (ref 150–450)
RBC: 4.78 x10E6/uL (ref 3.77–5.28)
RDW: 12.4 % (ref 11.7–15.4)
WBC: 6 10*3/uL (ref 3.4–10.8)

## 2019-09-09 LAB — VITAMIN D 25 HYDROXY (VIT D DEFICIENCY, FRACTURES): Vit D, 25-Hydroxy: 33 ng/mL (ref 30.0–100.0)

## 2019-09-09 LAB — TSH: TSH: 2.4 u[IU]/mL (ref 0.450–4.500)

## 2019-10-18 ENCOUNTER — Other Ambulatory Visit: Payer: Self-pay | Admitting: Family Medicine

## 2019-10-18 DIAGNOSIS — G4700 Insomnia, unspecified: Secondary | ICD-10-CM

## 2019-10-18 NOTE — Telephone Encounter (Signed)
Is this okay to refill? 

## 2019-10-21 ENCOUNTER — Telehealth: Payer: Self-pay | Admitting: Family Medicine

## 2019-10-21 NOTE — Telephone Encounter (Signed)
Pt called and cancelled her med check virtual for next week as she has jury duty.  She stated the reason for the visit was she wanted to increase her Lexapro and decided not to increase it.

## 2019-10-27 ENCOUNTER — Encounter: Payer: BC Managed Care – PPO | Admitting: Family Medicine

## 2019-11-22 DIAGNOSIS — J342 Deviated nasal septum: Secondary | ICD-10-CM | POA: Diagnosis not present

## 2019-11-22 DIAGNOSIS — J343 Hypertrophy of nasal turbinates: Secondary | ICD-10-CM | POA: Diagnosis not present

## 2019-12-20 ENCOUNTER — Other Ambulatory Visit: Payer: Self-pay | Admitting: Family Medicine

## 2019-12-20 DIAGNOSIS — G4700 Insomnia, unspecified: Secondary | ICD-10-CM

## 2019-12-20 NOTE — Telephone Encounter (Signed)
Pt. Is requesting a refill on her Ambien which was last filled 10/18/19, last apt was 09/08/19 pt. Next apt was 04/24/20.

## 2020-02-09 ENCOUNTER — Other Ambulatory Visit: Payer: Self-pay | Admitting: Family Medicine

## 2020-02-09 DIAGNOSIS — F411 Generalized anxiety disorder: Secondary | ICD-10-CM

## 2020-02-09 NOTE — Telephone Encounter (Signed)
Called patient and she is still only taking 5mg , and wants to continue taking that dosage. Does need this refill as she only has 7 left.

## 2020-02-21 ENCOUNTER — Other Ambulatory Visit: Payer: Self-pay | Admitting: Family Medicine

## 2020-02-21 DIAGNOSIS — G4700 Insomnia, unspecified: Secondary | ICD-10-CM

## 2020-02-21 NOTE — Telephone Encounter (Signed)
Is this okay to refill? 

## 2020-03-08 DIAGNOSIS — Z03818 Encounter for observation for suspected exposure to other biological agents ruled out: Secondary | ICD-10-CM | POA: Diagnosis not present

## 2020-03-08 DIAGNOSIS — M65352 Trigger finger, left little finger: Secondary | ICD-10-CM | POA: Diagnosis not present

## 2020-03-08 DIAGNOSIS — M79641 Pain in right hand: Secondary | ICD-10-CM | POA: Diagnosis not present

## 2020-03-08 DIAGNOSIS — M79642 Pain in left hand: Secondary | ICD-10-CM | POA: Diagnosis not present

## 2020-04-18 ENCOUNTER — Other Ambulatory Visit: Payer: Self-pay | Admitting: Family Medicine

## 2020-04-18 DIAGNOSIS — G4700 Insomnia, unspecified: Secondary | ICD-10-CM

## 2020-04-18 NOTE — Telephone Encounter (Signed)
Is this okay to refill? 

## 2020-04-22 NOTE — Progress Notes (Signed)
Chief Complaint  Patient presents with  . Anxiety    6 month follow up. No new concerns.   . Other    depression screen-(1)    Patient presents for 6 month follow up on anxiety. She was restarted on lexapro 5mg  in 01/2019 due to worsening anxiety during the pandemic. GAD-7 score prior to restarting lexapro was 14, improved to score of 6 at her f/u visit.  She always worries, but wasn't letting it take over her life.  She does better when able to get out of the house, dwells on things when stuck at home. At her physical in 08/2019 she reported having more "bad days", always has a harder time in the winter.  We previously dsicussed bumping the lexapro up by 1/2 tablet in the winter, and also discussed counseling, which she was "keeping in the back of her mind."    Today she presents with worsening anxiety/depression, exacerbated by the loss of 2 friends recently. (friend in [place] last week, one in August). She feels she is now ready to try the higher dose of lexapro (never increased after her last visit), but she is worried about "feeling like a zombie" on the higher dose, as she heard friends report this, and report trouble feeling any emotions.  She has never tried the higher dose herself. She reports decreased motivation, feels blue 2-3x/week, trouble falling asleep. Sometimes has palpitations. She continues to have trouble with sleep, taking ambien 3-4 times/week, sleeping 7 hours when she takes it, and only 4-5 hours if she doesn't.  She tried her husband's lorazepam once, didn't help her sleep.  Walks 3-4days/week x 45 minutes, which helps with her moods/anxiety.  She had mentioned in 08/2019 having "really bad gas" from the lexapro,which is why she hadn't gone up on the dose (at one point had also complained of bruising, but she states that was no longer an issue).  She had started eating yogurt daily as a change in her diet. We suggested stopping the yogurt for a week to see if gas is related to  that. Gas resolved after changing to dairy-free yogurt.  Zolpidem 5mg  was last filled 04/18/20 #30. Alprazolam is rarely needed, last filled 08/2019.   PMH, PSH, SH reviewed  Outpatient Encounter Medications as of 04/24/2020  Medication Sig  . ALPRAZolam (XANAX) 0.25 MG tablet Take 1-2 tablets (0.25-0.5 mg total) by mouth 3 (three) times daily as needed for anxiety.  Marland Kitchen escitalopram (LEXAPRO) 5 MG tablet Take 1.5 tablets by mouth once daily.  If tolerated, increase to 2 tablets once daily  . zolpidem (AMBIEN) 5 MG tablet TAKE 1/2 TO 1 TABLET AT BEDTIME AS NEEDED FOR SLEEP  . [DISCONTINUED] escitalopram (LEXAPRO) 5 MG tablet Take one daily  . Multiple Vitamin (MULTI VITAMIN PO) Take 1 tablet by mouth daily.   No facility-administered encounter medications on file as of 04/24/2020.   (taking 5mg  lexapro prior to today's visit)  Allergies  Allergen Reactions  . Sulfa Antibiotics Swelling and Rash  . Zithromax [Azithromycin] Other (See Comments)    Abdominal pain/cramping    ROS: no fever, chills, URI symptoms, headaches, dizziness, chest pain, shortness of breath.  No bleeding, bruising rashes. No nausea, vomiting, bowel changes. No longer complaining of gas. No urinary complaints. Insomnia and anxiety per HPI   PHYSICAL EXAM:  BP 124/80   Pulse 80   Ht 5' 4.5" (1.638 m)   Wt 137 lb 9.6 oz (62.4 kg)   BMI 23.25 kg/m  Wt Readings from Last 3 Encounters:  04/24/20 137 lb 9.6 oz (62.4 kg)  09/08/19 141 lb (64 kg)  03/24/19 137 lb (62.1 kg)   Pleasant, well-appearing female, in no distress. HEENT: conjunctiva and sclera are clear, EOMI, wearing mask She is alert, oriented, normal gait. She has anxious mood, intermittently sad/down, with full range of affect. She has normal eye contact, speech, hygiene and grooming  PHQ-9 score of 7 (some grief, some chronic insomnia; up from 4 in 08/2019) GAD-7 score of 13 (up from 6 in 08/2019)   ASSESSMENT/PLAN:  Generalized  anxiety disorder - suboptimally controlled on 5mg  lexapro. Titrate up to 10mg , staying at 7.5mg  for 1-2 weeks. counseling recommended - Plan: escitalopram (LEXAPRO) 5 MG tablet  Insomnia, unspecified type - discussed primary insomnia vs related to anxiety. Hope to see improvement as anxiety improves. Can continue ambien prn  Need for influenza vaccination - Plan: Flu Vaccine QUAD High Dose(Fluad)  Need for pneumococcal vaccination - Plan: Pneumococcal polysaccharide vaccine 23-valent greater than or equal to 2yo subcutaneous/IM  Grief reaction - sudden loss of 2 friends.  encouraged counseling   HD flu shot and Pneumovax given today Encouraged/reminded to get shingrix in future.  Increase the lexapro to 1.5 tablets daily.  If it makes you at all tired, change it to bedtime dosing. If you don't have any side effects after a week (headache, nausea, etc), then you can further increase to 2 tablets (10mg ).  If you can't tolerate 2 tablets, go back to 1.5 tablets.  I highly encourage counseling to help with your anxiety and working through the grief process.  Consider Shingrix at the pharmacy (series of 2 injections)--wait 4 weeks before starting this.

## 2020-04-24 ENCOUNTER — Other Ambulatory Visit: Payer: Self-pay

## 2020-04-24 ENCOUNTER — Ambulatory Visit (INDEPENDENT_AMBULATORY_CARE_PROVIDER_SITE_OTHER): Payer: BC Managed Care – PPO | Admitting: Family Medicine

## 2020-04-24 ENCOUNTER — Encounter: Payer: Self-pay | Admitting: Family Medicine

## 2020-04-24 VITALS — BP 124/80 | HR 80 | Ht 64.5 in | Wt 137.6 lb

## 2020-04-24 DIAGNOSIS — F411 Generalized anxiety disorder: Secondary | ICD-10-CM | POA: Diagnosis not present

## 2020-04-24 DIAGNOSIS — G4700 Insomnia, unspecified: Secondary | ICD-10-CM

## 2020-04-24 DIAGNOSIS — F4321 Adjustment disorder with depressed mood: Secondary | ICD-10-CM | POA: Diagnosis not present

## 2020-04-24 DIAGNOSIS — Z23 Encounter for immunization: Secondary | ICD-10-CM

## 2020-04-24 MED ORDER — ESCITALOPRAM OXALATE 5 MG PO TABS: ORAL_TABLET | ORAL | 0 refills | Status: DC

## 2020-04-24 NOTE — Patient Instructions (Signed)
  Increase the lexapro to 1.5 tablets daily.  If it makes you at all tired, change it to bedtime dosing. If you don't have any side effects after a week (headache, nausea, etc), then you can further increase to 2 tablets (10mg ).  If you can't tolerate 2 tablets, go back to 1.5 tablets.  I highly encourage counseling to help with your anxiety and working through the grief process.  Consider Shingrix at the pharmacy (series of 2 injections)--wait 4 weeks before starting this.

## 2020-06-05 ENCOUNTER — Encounter: Payer: BC Managed Care – PPO | Admitting: Family Medicine

## 2020-06-15 ENCOUNTER — Other Ambulatory Visit: Payer: Self-pay | Admitting: Family Medicine

## 2020-06-15 DIAGNOSIS — G4700 Insomnia, unspecified: Secondary | ICD-10-CM

## 2020-06-15 NOTE — Telephone Encounter (Signed)
Refill sent. But I saw that she cancelled her 6 week f/u on anxiety (after we increased her med) on 11/22.  Did she mention that?  She may need to r/s f/u if still taking the higher dose med or having any issues with moods.

## 2020-06-15 NOTE — Telephone Encounter (Signed)
Is this okay to refill? I did call patient, she did request.

## 2020-07-13 DIAGNOSIS — H43811 Vitreous degeneration, right eye: Secondary | ICD-10-CM | POA: Diagnosis not present

## 2020-08-18 ENCOUNTER — Other Ambulatory Visit: Payer: Self-pay | Admitting: Family Medicine

## 2020-08-18 DIAGNOSIS — G4700 Insomnia, unspecified: Secondary | ICD-10-CM

## 2020-08-18 NOTE — Telephone Encounter (Signed)
Is this okay to refill? 

## 2020-09-12 NOTE — Patient Instructions (Addendum)
HEALTH MAINTENANCE RECOMMENDATIONS:  It is recommended that you get at least 30 minutes of aerobic exercise at least 5 days/week (for weight loss, you may need as much as 60-90 minutes). This can be any activity that gets your heart rate up. This can be divided in 10-15 minute intervals if needed, but try and build up your endurance at least once a week.  Weight bearing exercise is also recommended twice weekly.  Eat a healthy diet with lots of vegetables, fruits and fiber.  "Colorful" foods have a lot of vitamins (ie green vegetables, tomatoes, red peppers, etc).  Limit sweet tea, regular sodas and alcoholic beverages, all of which has a lot of calories and sugar.  Up to 1 alcoholic drink daily may be beneficial for women (unless trying to lose weight, watch sugars).  Drink a lot of water.  Calcium recommendations are 1200-1500 mg daily (1500 mg for postmenopausal women or women without ovaries), and vitamin D 1000 IU daily.  This should be obtained from diet and/or supplements (vitamins), and calcium should not be taken all at once, but in divided doses.  Monthly self breast exams and yearly mammograms for women over the age of 51 is recommended.  Sunscreen of at least SPF 30 should be used on all sun-exposed parts of the skin when outside between the hours of 10 am and 4 pm (not just when at beach or pool, but even with exercise, golf, tennis, and yard work!)  Use a sunscreen that says "broad spectrum" so it covers both UVA and UVB rays, and make sure to reapply every 1-2 hours.  Remember to change the batteries in your smoke detectors when changing your clock times in the spring and fall. Carbon monoxide detectors are recommended for your home.  Use your seat belt every time you are in a car, and please drive safely and not be distracted with cell phones and texting while driving.  At your convenience, please get [place] copies of your living will and healthcare power of attorney, so they can be  scanned into your medical chart.  I recommend getting the new shingles vaccine (Shingrix). Once you have Medicare, you will need to get this from the pharmacy, as it is covered by Part D. When you have commercial insurance, check with your insurance to verify what your out of pocket cost may be (usually covered as preventative, but better to verify to avoid any surprises, as this vaccine is expensive), and then schedule a nurse visit at our office when convenient (based on the possible side effects as discussed).   This is a series of 2 injections, spaced 2 months apart.  It doesn't have to be exactly 2 months apart (but can't be under 2 months), if that isn't feasible for your schedule, but try and get them close to 2 months (and definitely within 6 months of each other, or else the efficacy of the vaccine drops off).  I highly encourage you to schedule your mammogram and bone density test (at the [facility]). You can get these done on the same day.  If you are very anxious, you can take an alprazolam prior to going, and have your husband or a friend drive you there. If you won't do the mammogram, at least consider the bone density test.  COVID booster is recommended now.  It is 1/2 dose of the Moderna, or you can switch it up and get Coca-Cola.  You can schedule this at your convenience.  For hand  arthritis, continue tylenol as needed (arthritis version lasts longer) Consider glucosamine/chondroitin supplement (ie Osteo biflex), or Turmeric. You can use topical creams and/or voltaren gel as needed.  Insomnia Insomnia is a sleep disorder that makes it difficult to fall asleep or stay asleep. Insomnia can cause fatigue, low energy, difficulty concentrating, mood swings, and poor performance at work or school. There are three different ways to classify insomnia:  Difficulty falling asleep.  Difficulty staying asleep.  Waking up too early in the morning. Any type of insomnia can be long-term  (chronic) or short-term (acute). Both are common. Short-term insomnia usually lasts for three months or less. Chronic insomnia occurs at least three times a week for longer than three months. What are the causes? Insomnia may be caused by another condition, situation, or substance, such as:  Anxiety.  Certain medicines.  Gastroesophageal reflux disease (GERD) or other gastrointestinal conditions.  Asthma or other breathing conditions.  Restless legs syndrome, sleep apnea, or other sleep disorders.  Chronic pain.  Menopause.  Stroke.  Abuse of alcohol, tobacco, or illegal drugs.  Mental health conditions, such as depression.  Caffeine.  Neurological disorders, such as Alzheimer's disease.  An overactive thyroid (hyperthyroidism). Sometimes, the cause of insomnia may not be known. What increases the risk? Risk factors for insomnia include:  Gender. Women are affected more often than men.  Age. Insomnia is more common as you get older.  Stress.  Lack of exercise.  Irregular work schedule or working night shifts.  Traveling between different time zones.  Certain medical and mental health conditions. What are the signs or symptoms? If you have insomnia, the main symptom is having trouble falling asleep or having trouble staying asleep. This may lead to other symptoms, such as:  Feeling fatigued or having low energy.  Feeling nervous about going to sleep.  Not feeling rested in the morning.  Having trouble concentrating.  Feeling irritable, anxious, or depressed. How is this diagnosed? This condition may be diagnosed based on:  Your symptoms and medical history. Your health care provider may ask about: ? Your sleep habits. ? Any medical conditions you have. ? Your mental health.  A physical exam. How is this treated? Treatment for insomnia depends on the cause. Treatment may focus on treating an underlying condition that is causing insomnia. Treatment may  also include:  Medicines to help you sleep.  Counseling or therapy.  Lifestyle adjustments to help you sleep better. Follow these instructions at home: Eating and drinking  Limit or avoid alcohol, caffeinated beverages, and cigarettes, especially close to bedtime. These can disrupt your sleep.  Do not eat a large meal or eat spicy foods right before bedtime. This can lead to digestive discomfort that can make it hard for you to sleep.   Sleep habits  Keep a sleep diary to help you and your health care provider figure out what could be causing your insomnia. Write down: ? When you sleep. ? When you wake up during the night. ? How well you sleep. ? How rested you feel the next day. ? Any side effects of medicines you are taking. ? What you eat and drink.  Make your bedroom a dark, comfortable place where it is easy to fall asleep. ? Put up shades or blackout curtains to block light from outside. ? Use a white noise machine to block noise. ? Keep the temperature cool.  Limit screen use before bedtime. This includes: ? Watching TV. ? Using your smartphone, tablet, or computer.  Stick to a routine that includes going to bed and waking up at the same times every day and night. This can help you fall asleep faster. Consider making a quiet activity, such as reading, part of your nighttime routine.  Try to avoid taking naps during the day so that you sleep better at night.  Get out of bed if you are still awake after 15 minutes of trying to sleep. Keep the lights down, but try reading or doing a quiet activity. When you feel sleepy, go back to bed.   General instructions  Take over-the-counter and prescription medicines only as told by your health care provider.  Exercise regularly, as told by your health care provider. Avoid exercise starting several hours before bedtime.  Use relaxation techniques to manage stress. Ask your health care provider to suggest some techniques that may  work well for you. These may include: ? Breathing exercises. ? Routines to release muscle tension. ? Visualizing peaceful scenes.  Make sure that you drive carefully. Avoid driving if you feel very sleepy.  Keep all follow-up visits as told by your health care provider. This is important. Contact a health care provider if:  You are tired throughout the day.  You have trouble in your daily routine due to sleepiness.  You continue to have sleep problems, or your sleep problems get worse. Get help right away if:  You have serious thoughts about hurting yourself or someone else. If you ever feel like you may hurt yourself or others, or have thoughts about taking your own life, get help right away. You can go to your nearest emergency department or call:  Your local emergency services (911 in the [place]  A suicide crisis helpline, such as the [place] at 308-403-5106. This is open 24 hours a day. Summary  Insomnia is a sleep disorder that makes it difficult to fall asleep or stay asleep.  Insomnia can be long-term (chronic) or short-term (acute).  Treatment for insomnia depends on the cause. Treatment may focus on treating an underlying condition that is causing insomnia.  Keep a sleep diary to help you and your health care provider figure out what could be causing your insomnia. This information is not intended to replace advice given to you by your health care provider. Make sure you discuss any questions you have with your health care provider. Document Revised: 05/11/2020 Document Reviewed: 05/11/2020 Elsevier Patient Education  [place].   Calcium Content in Foods Calcium is the most abundant mineral in the body. Most of the body's calcium supply is stored in bones and teeth. Calcium helps many parts of the body function normally, including:  Blood and blood vessels.  Nerves.  Hormones.  Muscles.  Bones and teeth. When your  calcium stores are low, you may be at risk for low bone mass, bone loss, and broken bones (fractures). When you get enough calcium, it helps to support strong bones and teeth throughout your life. Calcium is especially important for:  Children during growth spurts.  Girls during adolescence.  Women who are pregnant or breastfeeding.  Women after their menstrual cycle stops (postmenopause).  Women whose menstrual cycle has stopped due to anorexia nervosa or regular intense exercise.  People who cannot eat or digest dairy products.  Vegans. Recommended daily amounts of calcium:  Women (ages 62 to 41): 1,000 mg per day.  Women (ages 14 and older): 1,200 mg per day.  Men (ages 110 to 65): 1,000 mg per  day.  Men (ages 75 and older): 1,200 mg per day.  Women (ages 73 to 39): 1,300 mg per day.  Men (ages 4 to 31): 1,300 mg per day. General information  Eat foods that are high in calcium. Try to get most of your calcium from food.  Some people may benefit from taking calcium supplements. Check with your health care provider or diet and nutrition specialist (dietitian) before starting any calcium supplements. Calcium supplements may interact with certain medicines. Too much calcium may cause other health problems, such as constipation and kidney stones.  For the body to absorb calcium, it needs vitamin D. Sources of vitamin D include: ? Skin exposure to direct sunlight. ? Foods, such as egg yolks, liver, mushrooms, saltwater fish, and fortified milk. ? Vitamin D supplements. Check with your health care provider or dietitian before starting any vitamin D supplements. What foods are high in calcium? Foods that are high in calcium contain more than 100 milligrams per serving. Fruits  Fortified orange juice or other fruit juice, 300 mg per 8 oz serving. Vegetables  Collard greens, 360 mg per 8 oz serving.  Kale, 100 mg per 8 oz serving.  Bok choy, 160 mg per 8 oz  serving. Grains  Fortified ready-to-eat cereals, 100 to 1,000 mg per 8 oz serving.  Fortified frozen waffles, 200 mg in 2 waffles.  Oatmeal, 140 mg in 1 cup. Meats and other proteins  Sardines, canned with bones, 325 mg per 3 oz serving.  Salmon, canned with bones, 180 mg per 3 oz serving.  Canned shrimp, 125 mg per 3 oz serving.  Baked beans, 160 mg per 4 oz serving.  Tofu, firm, made with calcium sulfate, 253 mg per 4 oz serving. Dairy  Yogurt, plain, low-fat, 310 mg per 6 oz serving.  Nonfat milk, 300 mg per 8 oz serving.  American cheese, 195 mg per 1 oz serving.  Cheddar cheese, 205 mg per 1 oz serving.  Cottage cheese 2%, 105 mg per 4 oz serving.  Fortified soy, rice, or almond milk, 300 mg per 8 oz serving.  Mozzarella, part skim, 210 mg per 1 oz serving. The items listed above may not be a complete list of foods high in calcium. Actual amounts of calcium may be different depending on processing. Contact a dietitian for more information.   What foods are lower in calcium? Foods that are lower in calcium contain 50 mg or less per serving. Fruits  Apple, about 6 mg.  Banana, about 12 mg. Vegetables  Lettuce, 19 mg per 2 oz serving.  Tomato, about 11 mg. Grains  Rice, 4 mg per 6 oz serving.  Boiled potatoes, 14 mg per 8 oz serving.  White bread, 6 mg per slice. Meats and other proteins  Egg, 27 mg per 2 oz serving.  Red meat, 7 mg per 4 oz serving.  Chicken, 17 mg per 4 oz serving.  Fish, cod, or trout, 20 mg per 4 oz serving. Dairy  Cream cheese, regular, 14 mg per 1 Tbsp serving.  Brie cheese, 50 mg per 1 oz serving.  Parmesan cheese, 70 mg per 1 Tbsp serving. The items listed above may not be a complete list of foods lower in calcium. Actual amounts of calcium may be different depending on processing. Contact a dietitian for more information. Summary  Calcium is an important mineral in the body because it affects many functions. Getting  enough calcium helps support strong bones and teeth throughout your life.  Try to get most of your calcium from food.  Calcium supplements may interact with certain medicines. Check with your health care provider or dietitian before starting any calcium supplements. This information is not intended to replace advice given to you by your health care provider. Make sure you discuss any questions you have with your health care provider. Document Revised: 10/27/2019 Document Reviewed: 10/27/2019 Elsevier Patient Education  [place].

## 2020-09-12 NOTE — Progress Notes (Signed)
Chief Complaint  Patient presents with  . Annual Exam    Nonfasting annual exam with pelvic. No new concerns. Doesn't want booster or shingles vaccines today. Will schedule mammo and DEXA. PHQ-9(3).    Kristie Howell is a 70 y.o. female who presents for a complete physical.   She has the following concerns:  Anxiety follow-up: She was restarted on lexapro 79m in 01/2019 due to worsening anxiety during the pandemic.  At her physical in 08/2019 she reported having more "bad days", always has a harder time in the winter. We had discussed increasing the dose (to 1.5 tablets through the winter), but she hadn't done this.  In October 2021 she was ready to increase the dose (had stressors [place] 2 [place]  At that time she reported decreased motivation, feeling blue 2-3x/week, trouble falling asleep, sometimes having palpitations. She went up to 1.5 tablets only for 1.5 weeks, didn't like how she felt (can't recall, maybe a little lightheaded, "it wasn't me"). She went back down to 538mand has been doing okay.  She uses alprazolam only rarely (mainly for the dentist).  She needs a refill (last filled a year ago).  Insomnia--taking ambien 3-4 times/week, sometimes up to 5x/week. She usually just takes 1/2 tablet, sometimes needs full tablet. She has trouble falling asleep, can't shut her mind down.  This has been worse in the last year.  No side effects. Zolpidem 29m58mas last filled 08/18/20 #30, filling every 2 months.  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 There was microinvasion on the left side.She last saw Dr. GehAlycia Rossetti June 2018, and after CA 125 was normal on 5 year follow-up, she was released from her care. Pt was told to continue to have Ca-125 monitored. Last done 08/2019, normal at 18.3. She denies any pelvic pain, bleeding or other GYN concerns.  She no longer sees a gynecologist (they have  all retired). She does do self breast exams, doesn't notice the tenderness in the left breast as often as in the past.  Vitamin D was checked 08/2019, normall at 33;43he was taking MVI daily at that time. She continues to take daily MVI.  Immunization History  Administered Date(s) Administered  . Fluad Quad(high Dose 65+) 03/24/2019, 04/24/2020  . Influenza Split 05/05/2013  . Influenza,inj,Quad PF,6+ Mos 05/16/2014, 04/10/2017  . Influenza-Unspecified 05/05/2018  . Moderna Sars-Covid-2 Vaccination 02/10/2020, 03/21/2020  . Pneumococcal Conjugate-13 03/24/2019  . Pneumococcal Polysaccharide-23 04/24/2020  . Tdap 03/23/2007, 11/18/2016  . Zoster 03/28/2013   Last Pap smear: 07/2016 through Dr. GehAlycia Rossettist mammogram: 05/2013 (knows she is past due, too anxious to get) Last colonoscopy:03/2016, due again 2022 (Dr. MagWatt Climesue to family history Last DEXA: never (ordered last year, not scheduled) Ophtho: yearly  Dentist: every 6 months Exercise:Walks with husband in the afternoons 45 minutes 3-4x/week, weather-permitting. Also dances to bea[employer] Palmoliveses some 2# hand weights and does squats, and holding grandchildren.  Eats yogurt daily for breakfast (4 oz), doesn't drink milk; some cheese.  Lipid screen: Lab Results  Component Value Date   CHOL 222 (H) 09/08/2019   HDL 70 09/08/2019   LDLCALC 126 (H) 09/08/2019   TRIG 150 (H) 09/08/2019   CHOLHDL 3.2 09/08/2019    Other doctors caring for patient include:  GYN-onc Dr. GehAlycia Rossettio longer sees) GYN (Was Dr. GotCherylann Banasetired) Dentist: Dr. MatDaneil Dolinhtho: Dr. WeaVesta MixerSO dermatology GI: Dr MagWatt Climes  Depression screen:  negative Fall Screen negative Functional status survey: unremarkable Mini-Cog screen normal See full screens in Epic   End of Life Discussion:  Patient has a living will and medical power of attorney. She was asked to get [place] a copy   PMH, PSH, SH and FH were reviewed and updated  Outpatient  Encounter Medications as of 09/13/2020  Medication Sig Note  . escitalopram (LEXAPRO) 5 MG tablet Take 1.5 tablets by mouth once daily.  If tolerated, increase to 2 tablets once daily 09/13/2020: Takes 61m   . Multiple Vitamin (MULTI VITAMIN PO) Take 1 tablet by mouth daily.   .Marland Kitchenzolpidem (AMBIEN) 5 MG tablet TAKE 1/2 TO 1 TABLET AT BEDTIME AS NEEDED FOR SLEEP   . ALPRAZolam (XANAX) 0.25 MG tablet Take 1-2 tablets (0.25-0.5 mg total) by mouth 3 (three) times daily as needed for anxiety. (Patient not taking: Reported on 09/13/2020) 09/13/2020: Uses prn (dentist)   No facility-administered encounter medications on file as of 09/13/2020.   Allergies  Allergen Reactions  . Sulfa Antibiotics Swelling and Rash  . Zithromax [Azithromycin] Other (See Comments)    Abdominal pain/cramping    ROS: The patient denies anorexia, fever, weight changes, headaches, vision changes, decreased hearing, ear pain, sore throat, breast concerns, chest pain, dizziness, syncope, dyspnea on exertion, cough, swelling, nausea, vomiting, diarrhea, constipation, abdominal pain, melena, hematochezia, indigestion/heartburn, hematuria, incontinence, dysuria, vaginal bleeding, discharge, odor or itch, genital lesions; no numbness, tingling, weakness, tremor, suspicious skin lesions, abnormal bleeding/bruising, or enlarged lymph nodes. Intermittent, mild palpitations (chronic), mostly at night, when anxious.  Some arthritis developing in her fingers. Hand stiffness has been worse this winter. Uses tylenol prn, which helps. Saw hand surgeon recently, no treatment recommended. +anxiety per HPI, and insomnia +vaginal dryness and discomfort with intercourse No hot flashes (very rare)   PHYSICAL EXAM:  BP 124/78   Pulse 80   Ht 5' 4.5" (1.638 m)   Wt 136 lb 9.6 oz (62 kg)   BMI 23.09 kg/m   Wt Readings from Last 3 Encounters:  09/13/20 136 lb 9.6 oz (62 kg)  04/24/20 137 lb 9.6 oz (62.4 kg)  09/08/19 141 lb (64 kg)    General  Appearance:   Alert, cooperative, no distress, appears stated age, in good spirits  Head:   Normocephalic, without obvious abnormality, atraumatic  Eyes:   PERRL, conjunctiva/corneas clear, EOM's intact, fundi benign  Ears:   Normal TM's and external ear canals  Nose:  Not examined, wearing mask due to COVID-19 pandemic  Throat:  Not examined, wearing mask due to COVID-19 pandemic  Neck:  Supple, no lymphadenopathy; thyroid: noenlargement/ tenderness/nodules; no carotid bruit or JVD  Back:  Spine nontender, no curvature, ROM normal, noCVA tenderness.  Lungs:   Clear to auscultation bilaterally without wheezes, rales or ronchi; respirations unlabored  Chest Wall:   No tenderness or deformity  Heart:   Regular rate and rhythm, S1 and S2 normal, no murmur, rub  or gallop  Breast Exam:   No nipple inversion, discharge, skin dimpling or masses. No axillary lymphadenopathy  Abdomen:   Soft, non-tender, nondistended, normoactive bowel sounds, no masses, no hepatosplenomegaly. WHSS  Genitalia:   Normal external genitalia, without lesions. No abnormal vaginal discharge. Uterus is surgically absent.  No adnexal masses or tenderness  Rectal:  Normal sphincter tone, no masses.  Heme negative light brown stool  Extremities:  No clubbing, cyanosis or edema.Left 2nd and third fingers have mild bonyhypertrophyat the DIP joints, and arthritic changes  of L 5th digit as well.  Pulses:  2+ and symmetric all extremities  Skin:  Skin color, texture, turgor normal, no rashes or lesions.  Lymph nodes:  Cervical, supraclavicular, and axillary nodes normal  Neurologic:  Normal strength, sensation and gait; reflexes 2+ and symmetric throughout  Psych: Normal mood, affect, hygiene and grooming  PHQ-9 score of 3 (little energy, trouble sleeping)   ASSESSMENT/PLAN:  Annual physical exam - Plan:  POCT Urinalysis DIP (Proadvantage Device), Comprehensive metabolic panel, CBC with Differential/Platelet, CA 125  Generalized anxiety disorder - on Lexapro; somewhat controlled, but has insomnia related to anxiety. Consider re-try increasing lexapro dose; consider Buspar in future if worse - Plan: ALPRAZolam (XANAX) 0.25 MG tablet, escitalopram (LEXAPRO) 5 MG tablet  Insomnia, unspecified type - risks of meds reviewed, disc is a sx of anxiety.  Reviewed sleep hygiene  Personal history of ovarian cancer - was a borderline tumor, with elevated CA-125. Due for monitoring - Plan: CA 125  History of endometrial cancer   Ca 125, c-met (not fasting), CBC   Past due for mammo and DEXA.  Asked to schedule, can do on same day, can take alprazolam and have husband drive her. Encouraged monthly self breast exams; at least 30 minutes of aerobic activity at least 5 days/week, weight-bearing exercise at least 2x/week; proper sunscreen use reviewed; healthy diet, including goals of calcium and vitamin D intake and alcohol recommendations (less than or equal to 1 drink/day) reviewed; regular seatbelt use; changing batteries in smoke detectors. Immunization recommendations discussed--continue yearly flu shots.  Shingrix is recommended, risks, SE reviewed; needs to check insurance, then schedule NV (needs to get from pharmacy when on Medicare).  Timing of vaccines reviewed. Colonoscopy recommendations reviewed--dueagain 03/2021   Patient was asked to get [place] copies of living will and healthcare POA Full Code, Full Care

## 2020-09-13 ENCOUNTER — Ambulatory Visit (INDEPENDENT_AMBULATORY_CARE_PROVIDER_SITE_OTHER): Payer: BC Managed Care – PPO | Admitting: Family Medicine

## 2020-09-13 ENCOUNTER — Encounter: Payer: Self-pay | Admitting: Family Medicine

## 2020-09-13 ENCOUNTER — Other Ambulatory Visit: Payer: Self-pay

## 2020-09-13 VITALS — BP 124/78 | HR 80 | Ht 64.5 in | Wt 136.6 lb

## 2020-09-13 DIAGNOSIS — F411 Generalized anxiety disorder: Secondary | ICD-10-CM

## 2020-09-13 DIAGNOSIS — Z0000 Encounter for general adult medical examination without abnormal findings: Secondary | ICD-10-CM

## 2020-09-13 DIAGNOSIS — G4700 Insomnia, unspecified: Secondary | ICD-10-CM | POA: Diagnosis not present

## 2020-09-13 DIAGNOSIS — Z8543 Personal history of malignant neoplasm of ovary: Secondary | ICD-10-CM

## 2020-09-13 DIAGNOSIS — Z8542 Personal history of malignant neoplasm of other parts of uterus: Secondary | ICD-10-CM | POA: Diagnosis not present

## 2020-09-13 LAB — POCT URINALYSIS DIP (PROADVANTAGE DEVICE)
Bilirubin, UA: NEGATIVE
Glucose, UA: NEGATIVE mg/dL
Ketones, POC UA: NEGATIVE mg/dL
Leukocytes, UA: NEGATIVE
Nitrite, UA: NEGATIVE
Protein Ur, POC: NEGATIVE mg/dL
Specific Gravity, Urine: 1.01
Urobilinogen, Ur: NEGATIVE
pH, UA: 6.5 (ref 5.0–8.0)

## 2020-09-13 MED ORDER — ESCITALOPRAM OXALATE 5 MG PO TABS: 5.0000 mg | ORAL_TABLET | Freq: Every day | ORAL | 3 refills | Status: DC

## 2020-09-13 MED ORDER — ALPRAZOLAM 0.25 MG PO TABS: 0.2500 mg | ORAL_TABLET | Freq: Three times a day (TID) | ORAL | 0 refills | Status: DC | PRN

## 2020-09-14 LAB — CA 125: Cancer Antigen (CA) 125: 17.4 U/mL (ref 0.0–38.1)

## 2020-09-14 LAB — COMPREHENSIVE METABOLIC PANEL
ALT: 37 IU/L — ABNORMAL HIGH (ref 0–32)
AST: 29 IU/L (ref 0–40)
Albumin/Globulin Ratio: 1.8 (ref 1.2–2.2)
Albumin: 4.8 g/dL (ref 3.8–4.8)
Alkaline Phosphatase: 107 IU/L (ref 44–121)
BUN/Creatinine Ratio: 19 (ref 12–28)
BUN: 13 mg/dL (ref 8–27)
Bilirubin Total: 0.6 mg/dL (ref 0.0–1.2)
CO2: 22 mmol/L (ref 20–29)
Calcium: 9.5 mg/dL (ref 8.7–10.3)
Chloride: 103 mmol/L (ref 96–106)
Creatinine, Ser: 0.7 mg/dL (ref 0.57–1.00)
Globulin, Total: 2.6 g/dL (ref 1.5–4.5)
Glucose: 98 mg/dL (ref 65–99)
Potassium: 4.4 mmol/L (ref 3.5–5.2)
Sodium: 144 mmol/L (ref 134–144)
Total Protein: 7.4 g/dL (ref 6.0–8.5)
eGFR: 95 mL/min/{1.73_m2} (ref >59)

## 2020-09-14 LAB — CBC WITH DIFFERENTIAL/PLATELET
Basophils Absolute: 0 10*3/uL (ref 0.0–0.2)
Basos: 0 %
EOS (ABSOLUTE): 0.1 10*3/uL (ref 0.0–0.4)
Eos: 1 %
Hematocrit: 42.2 % (ref 34.0–46.6)
Hemoglobin: 14.6 g/dL (ref 11.1–15.9)
Immature Grans (Abs): 0 10*3/uL (ref 0.0–0.1)
Immature Granulocytes: 0 %
Lymphocytes Absolute: 1.7 10*3/uL (ref 0.7–3.1)
Lymphs: 23 %
MCH: 31.3 pg (ref 26.6–33.0)
MCHC: 34.6 g/dL (ref 31.5–35.7)
MCV: 91 fL (ref 79–97)
Monocytes Absolute: 0.4 10*3/uL (ref 0.1–0.9)
Monocytes: 5 %
Neutrophils Absolute: 5 10*3/uL (ref 1.4–7.0)
Neutrophils: 71 %
Platelets: 249 10*3/uL (ref 150–450)
RBC: 4.66 x10E6/uL (ref 3.77–5.28)
RDW: 12.4 % (ref 11.7–15.4)
WBC: 7.1 10*3/uL (ref 3.4–10.8)

## 2020-11-08 DIAGNOSIS — H25013 Cortical age-related cataract, bilateral: Secondary | ICD-10-CM | POA: Diagnosis not present

## 2020-11-08 DIAGNOSIS — H2513 Age-related nuclear cataract, bilateral: Secondary | ICD-10-CM | POA: Diagnosis not present

## 2020-11-08 DIAGNOSIS — H35363 Drusen (degenerative) of macula, bilateral: Secondary | ICD-10-CM | POA: Diagnosis not present

## 2020-11-08 DIAGNOSIS — H40033 Anatomical narrow angle, bilateral: Secondary | ICD-10-CM | POA: Diagnosis not present

## 2020-12-19 ENCOUNTER — Other Ambulatory Visit: Payer: Self-pay | Admitting: Family Medicine

## 2020-12-19 DIAGNOSIS — G4700 Insomnia, unspecified: Secondary | ICD-10-CM

## 2020-12-19 NOTE — Telephone Encounter (Signed)
Ok to refill 

## 2021-01-08 ENCOUNTER — Telehealth: Payer: Self-pay | Admitting: Family Medicine

## 2021-01-08 NOTE — Telephone Encounter (Signed)
Pt called and is on the schedule for wed she has right ear pain and states it is going down to her jaw, and has been going on for over 3 weeks, and has some nasal drip and has caused a sore throat but she states that the sore throat is gone,  no other symptoms and has not been tested for covid, She has been at her house on [place] for the past 3 weeks  Do you want to do a virtual or a in office I informed her that I would have to ask you first

## 2021-01-08 NOTE — Telephone Encounter (Signed)
Can be in person unless the upper respiratory symptoms are new (you said jaw pain was a few weeks), or if her grandchildren or anyone she has been around has been sick. Then should be virtual.

## 2021-01-10 ENCOUNTER — Other Ambulatory Visit: Payer: Self-pay

## 2021-01-10 ENCOUNTER — Ambulatory Visit (INDEPENDENT_AMBULATORY_CARE_PROVIDER_SITE_OTHER): Payer: BC Managed Care – PPO | Admitting: Family Medicine

## 2021-01-10 ENCOUNTER — Encounter: Payer: Self-pay | Admitting: Family Medicine

## 2021-01-10 VITALS — BP 120/70 | HR 68 | Temp 98.2°F | Ht 64.5 in | Wt 136.0 lb

## 2021-01-10 DIAGNOSIS — H9201 Otalgia, right ear: Secondary | ICD-10-CM | POA: Diagnosis not present

## 2021-01-10 DIAGNOSIS — K1321 Leukoplakia of oral mucosa, including tongue: Secondary | ICD-10-CM

## 2021-01-10 NOTE — Progress Notes (Signed)
Chief Complaint  Patient presents with   Ear Pain    Right ear pain and pressure down her right jaw x 4 weeks. Feels somewhat better today.    She has had discomfort in the right ear for about a month.  Sometimes it throbs, aches, sometimes it feels like there is fluid in there.  She has a known deviated septum (saw Dr. Redmond Baseman for this last year).  Sometimes she feels like there is drainage from the nose, post-nasal.  Sometimes she has discomfort in front of the ear when her ear is hurting. She thinks she may see some white spots on the right side of the throat.  Denies runny nose, sneezing, itchy/watery eyes. Denies sinus pain.  Mucus is clear. The week before last pain was daily; usually it comes and goes.  No pain with eating/chewing. +known clenching of teeth, has a bite guard which she hasn't been using. She reports usually the L side of her jaw bothers her from not wearing it.  That pain was more of a jaw pain, rather than her ear. She has clicking and popping of the L jaw  PMH, PSH, SH reviewed  Outpatient Encounter Medications as of 01/10/2021  Medication Sig   escitalopram (LEXAPRO) 5 MG tablet Take 1 tablet (5 mg total) by mouth daily.   Multiple Vitamin (MULTI VITAMIN PO) Take 1 tablet by mouth daily.   zolpidem (AMBIEN) 5 MG tablet TAKE 1/2 TO 1 TABLET AT BEDTIME AS NEEDED FOR SLEEP   ALPRAZolam (XANAX) 0.25 MG tablet Take 1-2 tablets (0.25-0.5 mg total) by mouth 3 (three) times daily as needed for anxiety. (Patient not taking: Reported on 01/10/2021)   No facility-administered encounter medications on file as of 01/10/2021.   Allergies  Allergen Reactions   Sulfa Antibiotics Swelling and Rash   Zithromax [Azithromycin] Other (See Comments)    Abdominal pain/cramping   ROS:  No fever, chills, URI symptoms, cough, shortness of breath, chest pain, palpitations, n/v/d, bleeding, rashes. She noticed bruising after taking advil for her ear pain. R ear pain per HPI, see  HPI   PHYSICAL EXAM:  BP 120/70   Pulse 68   Temp 98.2 F (36.8 C) (Tympanic)   Ht 5' 4.5" (1.638 m)   Wt 136 lb (61.7 kg)   BMI 22.98 kg/m   Pleasant, well-appearing female in no distress HEENT: conjunctiva and sclera are clear, EOMI. Nose: only mild deviation of septum, no erythema or drainage noted. Sinuses nontender. Normal TM and EAC on the left. Right ear: no cerumen. TM is normal, slightly more diffuse light reflex, no erythema. OP: very faint area of possible leukoplakia at soft palate on the right.  Otherwise OP is completely normal. Nontender at TMJ.  +clicking palpable with mouth opening on the left only. Neck: no cervical or supraclavicular lymphadenopathy Heart: regular rate and rhythm Lungs: clear bilaterally Neuro: alert and oriented, cranial nerves intact, normal strength, gait Psych: normal mood, affect, hygiene and grooming   ASSESSMENT/PLAN:  Right ear pain - Ddx reviewed--poss ETD, poss related ot TMJ. Trial claritin daily, sudafed prn pain.  ENT eval given mucosal abnormality on soft palate. - Plan: Ambulatory referral to ENT  Leukoplakia of oral cavity - vague paleness/white area on the R soft palate. To see ENT if this area persists, especially if also having ongoing ear pain. - Plan: Ambulatory referral to ENT  I suggest trying claritin daily, to treat any underlying allergy which may be contributing to postnasal drainage and eustachian tube  dysfunction (which can cause ear pain). You can try taking sudafed when the ear is hurting. You may continue tylenol and/or advil if needed for pain. I'm not sure if the very faint possible white area on the right side is something or not.  I'm going to refer you to ENT for further evaluation.  If your symptoms and white area completely resolve within 1-2 weeks, you can cancel the appointment.

## 2021-01-10 NOTE — Patient Instructions (Signed)
  I suggest trying claritin daily, to treat any underlying allergy which may be contributing to postnasal drainage and eustachian tube dysfunction (which can cause ear pain). You can try taking sudafed when the ear is hurting. You may continue tylenol and/or advil if needed for pain. I'm not sure if the very faint possible white area on the right side is something or not.  I'm going to refer you to ENT for further evaluation.  If your symptoms and white area completely resolve within 1-2 weeks, you can cancel the appointment.

## 2021-05-01 ENCOUNTER — Other Ambulatory Visit: Payer: Self-pay | Admitting: Family Medicine

## 2021-05-01 DIAGNOSIS — G4700 Insomnia, unspecified: Secondary | ICD-10-CM

## 2021-06-06 DIAGNOSIS — D2262 Melanocytic nevi of left upper limb, including shoulder: Secondary | ICD-10-CM | POA: Diagnosis not present

## 2021-06-06 DIAGNOSIS — D2261 Melanocytic nevi of right upper limb, including shoulder: Secondary | ICD-10-CM | POA: Diagnosis not present

## 2021-06-06 DIAGNOSIS — L570 Actinic keratosis: Secondary | ICD-10-CM | POA: Diagnosis not present

## 2021-06-06 DIAGNOSIS — D2239 Melanocytic nevi of other parts of face: Secondary | ICD-10-CM | POA: Diagnosis not present

## 2021-06-13 DIAGNOSIS — M779 Enthesopathy, unspecified: Secondary | ICD-10-CM | POA: Diagnosis not present

## 2021-06-13 DIAGNOSIS — M7911 Myalgia of mastication muscle: Secondary | ICD-10-CM | POA: Diagnosis not present

## 2021-06-13 DIAGNOSIS — M2669 Other specified disorders of temporomandibular joint: Secondary | ICD-10-CM | POA: Diagnosis not present

## 2021-06-13 DIAGNOSIS — M26632 Articular disc disorder of left temporomandibular joint: Secondary | ICD-10-CM | POA: Diagnosis not present

## 2021-07-03 ENCOUNTER — Other Ambulatory Visit: Payer: Self-pay | Admitting: Family Medicine

## 2021-07-03 DIAGNOSIS — G4700 Insomnia, unspecified: Secondary | ICD-10-CM

## 2021-07-03 NOTE — Telephone Encounter (Signed)
[facility] is requesting to fill pt ambien. Please advise [facility]

## 2021-09-19 NOTE — Progress Notes (Signed)
Chief Complaint  Patient presents with   Annual Exam    Fasting annual exam with pelvic, states her GYN retired. Has not yet gotten Shingrix, hesitant to have today will discuss with you. Right sided sciatic pain for couple of months. Colonoscopy is scheduled for mid April with Dr. Watt Climes. Not sure if she in willing to have DEXA right now.     Kristie Howell is a 71 y.o. female who presents for a complete physical.   She has the following concerns:  R low back/hip pain since January.  Sometimes she can feel it down the back of her R calf and into her foot.  Feels better when walking.  Hurts after prolonged sitting. Advil or Aleve helps temporarily, not taking regularly.   No fall, trauma/injury or change in activity.  Anxiety follow-up: She was restarted on lexapro 17m in 01/2019 due to worsening anxiety during the pandemic. Dose was increased to 1.5 tablets in October 2021 (had stressors including losing 2 friends, [place].  At that time she reported decreased motivation, feeling blue 2-3x/week, trouble falling asleep, sometimes having palpitations. She went up to 1.5 tablets only for 1.5 weeks, didn't like how she felt, and has been doing well on the 556mdose since then.   She uses alprazolam only rarely (mainly for the dentist).  Last rx'd #30 in 09/2020. She has taken some with travel.  She has about 6-7 tablets left. She reports still doing well on 41m69mose, it keeps her even and she wants to stay at this dose. She does also reports increased insomnia, trouble shutting her mind down, see below. She needs this refilled.  Insomnia--taking ambien 3-4 times/week, sometimes up to 5x/week. She usually just takes 1/2 tablet, sometimes needs full tablet. She has trouble falling asleep, can't shut her mind down. She denies side effects.  She sleeps very well when she takes 1/2 tablet, and feels great the next day.  Her daughter calls at night, "unloads", and hard for her to shut that out.   She tried some exercises, relaxation. She has had counseling in the past, twice. Zolpidem 41mg541ms last filled 07/03/21 #30, and needs a refill today.  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 There was microinvasion on the left side. She last saw Dr. GehrAlycia RossettiJune 2018, and after CA 125 was normal on 5 year follow-up, she was released from her care.  Pt was told to continue to have Ca-125 monitored. Last done 09/2020, normal at 17.4. She denies any pelvic pain, bleeding or other GYN concerns.  She no longer sees a gynecologist (they have all retired).    Vitamin D was checked 08/2019, normall at 33; she was taking MVI daily at that time. She continues to take MVI, but admits she doesn't take it daily.  Immunization History  Administered Date(s) Administered   Fluad Quad(high Dose 65+) 03/24/2019, 04/24/2020   Influenza Split 05/05/2013   Influenza,inj,Quad PF,6+ Mos 05/16/2014, 04/10/2017   Influenza-Unspecified 05/05/2018, 04/14/2021   Moderna Sars-Covid-2 Vaccination 02/10/2020, 03/21/2020   Pneumococcal Conjugate-13 03/24/2019   Pneumococcal Polysaccharide-23 04/24/2020   Tdap 03/23/2007, 11/18/2016   Zoster, Live 03/28/2013   Declines COVID boosters Last Pap smear: 07/2016 through Dr. GehrAlycia Rossettit mammogram: 05/2013 (knows she is past due, too anxious to get) Last colonoscopy:03/2016, due again 2022  (Dr. MagoWatt Climese to family history. Scheduled for mid-April Last DEXA: never (ordered, never scheduled) Ophtho: yearly  Dentist: every 6 months Exercise: Walks with husband in the afternoons 45 minutes 3-4x/week, weather-permitting. Also dances to beach music, less often than in the past. Uses some 2# hand weights and does squats, and holding grandchildren. Stretches/exercises on yoga mat in the evenings.    Eats yogurt daily for breakfast (4 oz), doesn't drink milk; some cheese.  Lipid screen: Lab Results   Component Value Date   CHOL 222 (H) 09/08/2019   HDL 70 09/08/2019   LDLCALC 126 (H) 09/08/2019   TRIG 150 (H) 09/08/2019   CHOLHDL 3.2 09/08/2019    Other doctors caring for patient include:   GYN-onc Dr. Alycia Rossetti (no longer sees) GYN (Was Dr. Cherylann Banas, retired) Dentist: Dr. Daneil Dolin Ophtho: Dr. Vesta Mixer: [place] [facility] GI: Dr Watt Climes   Fall Risk 09/02/2018 09/08/2019 09/13/2020 01/10/2021 09/20/2021  Falls in the past year? 0 0 0 0 0  Was there an injury with Fall? - - - 0 0  Fall Risk Category Calculator - - - 0 0  Fall Risk Category - - - Low Low  Patient Fall Risk Level - - - Low fall risk Low fall risk  Patient at Risk for Falls Due to - - - No Fall Risks No Fall Risks  Fall risk Follow up - - - Falls evaluation completed Falls evaluation completed      End of Life Discussion:  Patient has a living will and medical power of attorney. She was asked to get [place] a copy   PMH, PSH, SH and FH were reviewed and updated  Outpatient Encounter Medications as of 09/20/2021  Medication Sig Note   ALPRAZolam (XANAX) 0.25 MG tablet Take 1-2 tablets (0.25-0.5 mg total) by mouth 3 (three) times daily as needed for anxiety. 09/20/2021: Took one last night   escitalopram (LEXAPRO) 5 MG tablet Take 1 tablet (5 mg total) by mouth daily.    Multiple Vitamin (MULTI VITAMIN PO) Take 1 tablet by mouth daily. 09/20/2021: Occasionally misses doses   zolpidem (AMBIEN) 5 MG tablet TAKE 1/2 TO 1 TABLET AT BEDTIME AS NEEDED FOR SLEEP (Patient not taking: Reported on 09/20/2021) 09/20/2021: Takes 1/2 tablet 3-5x/week   [DISCONTINUED] progesterone (PROMETRIUM) 100 MG capsule 1 tablet    No facility-administered encounter medications on file as of 09/20/2021.   Allergies  Allergen Reactions   Sulfa Antibiotics Swelling and Rash   Zithromax [Azithromycin] Other (See Comments)    Abdominal pain/cramping    ROS: The patient denies anorexia, fever, weight changes, headaches,  vision changes, decreased hearing,  ear pain, sore throat, breast concerns, chest pain, dizziness, syncope, dyspnea on exertion, cough, swelling, nausea, vomiting, diarrhea, constipation, abdominal pain, melena, hematochezia, indigestion/heartburn, hematuria, incontinence, dysuria, vaginal bleeding, discharge, odor or itch, genital lesions; no numbness, tingling, weakness, tremor, suspicious skin lesions, abnormal bleeding/bruising, or enlarged lymph nodes.  Some arthritis developing in her fingers, noticing some deformity in her thumbs.  Some finger stiffness. Uses tylenol prn, which helps.  +anxiety per HPI, and insomnia +vaginal dryness and discomfort with intercourse No hot flashes (very rare) R LBP per HPI    PHYSICAL EXAM:  BP 110/60    Pulse 80    Ht 5' 4.5" (1.638 m)    Wt 138 lb 6.4 oz (62.8 kg)    BMI 23.39 kg/m   Wt Readings from Last 3 Encounters:  09/20/21 138 lb 6.4 oz (62.8 kg)  01/10/21 136 lb (61.7 kg)  09/13/20 136 lb 9.6 oz (62 kg)    General Appearance:  Alert, cooperative, no distress, appears stated age, in good spirits  Head:      Normocephalic, without obvious abnormality, atraumatic    Eyes:      PERRL, conjunctiva/corneas clear, EOM's intact, fundi benign    Ears:      Normal TM's and external ear canals    Nose:     Not examined, wearing mask due to COVID-19 pandemic    Throat:     Not examined, wearing mask due to COVID-19 pandemic    Neck:     Supple, no lymphadenopathy;  thyroid:  no enlargement/ tenderness/nodules; no carotid bruit or JVD    Back:      No CVA  tenderness.  Slight tenderness at mid lumbar spine. Nontender at [facility] joints, no paraspinous muscle tenderness or spasm.  She is tender at gluteal notch and pyriformis muscles, pain with pyriformis and gluteal stretches.  Lungs:       Clear to auscultation bilaterally without wheezes, rales or ronchi; respirations unlabored    Chest Wall:      No tenderness or deformity     Heart:      Regular rate and rhythm, S1 and S2 normal, no  murmur, rub   or gallop    Breast Exam:      No nipple inversion, discharge, skin dimpling or masses.  No axillary lymphadenopathy   Abdomen:       Soft, non-tender, nondistended, normoactive bowel sounds, no masses, no hepatosplenomegaly. WHSS  Genitalia:      Normal external genitalia, without lesions. No abnormal vaginal discharge. Uterus is surgically absent.  No adnexal masses or tenderness  Rectal:      Normal sphincter tone, no masses.  Heme negative stool  Extremities:     No clubbing, cyanosis or edema. Left 2nd and third fingers have mild bony hypertrophy at the DIP joints, and arthritic changes of L 5th digit as well.  Pulses:     2+ and symmetric all extremities    Skin:     Skin color, texture, turgor normal, no rashes or lesions.  Lymph nodes:     Cervical, supraclavicular, inguinal and axillary nodes normal    Neurologic:     Normal strength, sensation and gait; reflexes 2+ and symmetric throughout. Negative SLR                         Psych:   Normal mood (slightly nervous-appearing), affect, hygiene and grooming   Urine dip: mod blood, otherwise clear  Depression screen [facility] 2/9 09/20/2021 09/13/2020 04/24/2020 04/24/2020 09/08/2019  Decreased Interest 0 0 1 0 0  Down, Depressed, Hopeless 0 0 '1 1 1  ' PHQ - 2 Score 0 0 '2 1 1  ' Altered sleeping '2 2 3 ' - 3  Tired, decreased energy '1 1 2 ' - 0  Change in appetite 0 0 0 - 0  Feeling bad or failure about yourself  0 0 0 - 0  Trouble concentrating 0 0 0 - 0  Moving slowly or fidgety/restless 0 0 0 - 0  Suicidal thoughts 0 0 0 - 0  PHQ-9 Score '3 3 7 ' - 4  Difficult doing work/chores Not difficult at all Not difficult at all - - -   GAD 7 : Generalized Anxiety Score 09/20/2021 04/24/2020 09/08/2019 03/24/2019  Nervous, Anxious, on Edge '1 1 1 1  ' Control/stop worrying '1 3 1 2  ' Worry too much - different things '1 3 1 2  ' Trouble  relaxing '1 1 1 ' 0  Restless 0 0 0 0  Easily annoyed or irritable '1 2 1 1  ' Afraid - awful might happen '1 3 1 ' 0   Total GAD 7 Score '6 13 6 6  ' Anxiety Difficulty - - - -     ASSESSMENT/PLAN:  Annual physical exam - Plan: POCT Urinalysis DIP (Proadvantage Device), Comprehensive metabolic panel, Lipid panel, CBC with Differential/Platelet, VITAMIN D 25 Hydroxy (Vit-D Deficiency, Fractures), CA 125  History of endometrial cancer - Plan: CA 125  Personal history of ovarian cancer - Plan: CA 125  Postmenopausal estrogen deficiency - Plan: DG Bone Density  Generalized anxiety disorder - on low doseLexapro; somewhat controlled, but has insomnia related to anxiety. Discussed counseling in detail, consider 7.68m lexapro trial again - Plan: escitalopram (LEXAPRO) 5 MG tablet  Insomnia, unspecified type - anxiety contributing. Cont zolpidem--low dose is very effective; reviewed relaxation techniques, sleep hygiene. Consider increase lexapro dose - Plan: zolpidem (AMBIEN) 5 MG tablet  Piriformis syndrome of right side - Risks/SE of NSAIDs reviewed.  Heat, stretches, massage.  PT vs chiro if not improving. - Plan: meloxicam (MOBIC) 15 MG tablet  Ca 125, c-met, CBC, vit D, lipid  Extensive conversations regarding anxiety, insomnia, as well as reasons for mammo, DEXA, as has been recommended but not done for many years. Also discussed her hand/finger arthritis, and her LBP/pyriformis syndrome in detail.  Past due for mammo and DEXA.  Asked to schedule, can do on same day, can take alprazolam and have husband drive her. Discussed reasons for mammo (earlier diagnosis, potentially better prognosis then waiting for palpable concern).  If unwilling to get mammo, still encouraged to get bone density, reasons why discussed. Encouraged monthly self breast exams; at least 30 minutes of aerobic activity at least 5 days/week, weight-bearing exercise at least 2x/week; proper sunscreen use reviewed; healthy diet, including goals of calcium and vitamin D intake and alcohol recommendations (less than or equal to 1 drink/day)  reviewed; regular seatbelt use; changing batteries in smoke detectors.  Immunization recommendations discussed--continue yearly flu shots.  Shingrix is recommended, risks, SE reviewed, discussed commercial vs Medicare insurance.  She doesn't want today due to upcoming plans, will schedule NV. Colonoscopy recommendations reviewed--due, scheduled for next month.   Patient was asked to get [place] of living will and healthcare POA Full Code, Full Care  Total FTF time was 60 minutes or greater, with additional 15-20 mins spent in chart review, documentation, lab f/u, etc.

## 2021-09-19 NOTE — Patient Instructions (Addendum)
?HEALTH MAINTENANCE RECOMMENDATIONS: ? ?It is recommended that you get at least 30 minutes of aerobic exercise at least 5 days/week (for weight loss, you may need as much as 60-90 minutes). This can be any activity that gets your heart rate up. This can be divided in 10-15 minute intervals if needed, but try and build up your endurance at least once a week.  Weight bearing exercise is also recommended twice weekly. ? ?Eat a healthy diet with lots of vegetables, fruits and fiber.  "Colorful" foods have a lot of vitamins (ie green vegetables, tomatoes, red peppers, etc).  Limit sweet tea, regular sodas and alcoholic beverages, all of which has a lot of calories and sugar.  Up to 1 alcoholic drink daily may be beneficial for women (unless trying to lose weight, watch sugars).  Drink a lot of water. ? ?Calcium recommendations are 1200-1500 mg daily (1500 mg for postmenopausal women or women without ovaries), and vitamin D 1000 IU daily.  This should be obtained from diet and/or supplements (vitamins), and calcium should not be taken all at once, but in divided doses. ? ?Monthly self breast exams and yearly mammograms for women over the age of 74 is recommended. ? ?Sunscreen of at least SPF 30 should be used on all sun-exposed parts of the skin when outside between the hours of 10 am and 4 pm (not just when at beach or pool, but even with exercise, golf, tennis, and yard work!)  Use a sunscreen that says "broad spectrum" so it covers both UVA and UVB rays, and make sure to reapply every 1-2 hours. ? ?Remember to change the batteries in your smoke detectors when changing your clock times in the spring and fall. Carbon monoxide detectors are recommended for your home. ? ?Use your seat belt every time you are in a car, and please drive safely and not be distracted with cell phones and texting while driving. ? ?I recommend getting the new shingles vaccine (Shingrix). Once you have Medicare, you will need to get this from  the pharmacy, as it is covered by Part D. ?When you have commercial insurance, check with your insurance to verify what your out of pocket cost may be (usually covered as preventative, but better to verify to avoid any surprises, as this vaccine is expensive), and then schedule a nurse visit at our office when convenient (based on the possible side effects as discussed).   ?This is a series of 2 injections, spaced 2 months apart.  It doesn't have to be exactly 2 months apart (but can't be under 2 months), if that isn't feasible for your schedule, but try and get them close to 2 months (and definitely within 6 months of each other, or else the efficacy of the vaccine drops off). ?  ?I highly encourage you to schedule your mammogram and bone density test (at [facility] You can get these done on the same day.  If you are very anxious, you can take an alprazolam prior to going, and have your husband or a friend drive you there. ?If you won't do the mammogram, at least consider the bone density test. ? ?I encourage you to look into counseling (we briefly mentioned [facility], [facility], [place], though there are many others, just depends who takes your insurance).  ? ?Consider re-trying the 1.5 tablets of the lexapro, since the insomnia is a symptom of anxiety, and it sounds as though you still have increased stressors.  You may  continue to use the 1/2 tablet of zolpidem (ambien) if/when needed, though if your anxiety is better controlled, I suspect you wouldn't need it as often. ? ?Consider a separate vitamin D3 (1000-2000 IU) if you'd prefer that in place of a daily multivitamin.  Goal is 7-10,000 IU weekly. ? ?I encourage you to consider getting a bivalent COVID booster (your choice as to [employer]). ? ?Please call the [facility] to schedule a bone density test to evaluate for osteoporosis. I would love for you to also schedule your mammogram (can be done the same day).  It  is fine if you need to take xanax in order to get the mammogram, just have someone else drive you. ? ?At your convenience, please get [place] a copy of your living will and healthcare power of attorney so it can be scanned into your chart. ? ?Consider glucosamine/chondroitin supplements to help with arthritis. ?Topical voltaren gel may also help, vs other topical medications. ? ?Take the meloxicam once daily with food. Cut the dose in half if it bothers your stomach. Take it until your pain has resolved.  ?Heat, massage, and stretches will also help. ?Next step would be chiropractor or physical therapy, if needed. ? ?

## 2021-09-20 ENCOUNTER — Other Ambulatory Visit: Payer: Self-pay

## 2021-09-20 ENCOUNTER — Encounter: Payer: Self-pay | Admitting: Family Medicine

## 2021-09-20 ENCOUNTER — Ambulatory Visit (INDEPENDENT_AMBULATORY_CARE_PROVIDER_SITE_OTHER): Payer: BC Managed Care – PPO | Admitting: Family Medicine

## 2021-09-20 VITALS — BP 110/60 | HR 80 | Ht 64.5 in | Wt 138.4 lb

## 2021-09-20 DIAGNOSIS — G4700 Insomnia, unspecified: Secondary | ICD-10-CM

## 2021-09-20 DIAGNOSIS — F411 Generalized anxiety disorder: Secondary | ICD-10-CM | POA: Diagnosis not present

## 2021-09-20 DIAGNOSIS — Z780 Asymptomatic menopausal state: Secondary | ICD-10-CM | POA: Diagnosis not present

## 2021-09-20 DIAGNOSIS — Z8542 Personal history of malignant neoplasm of other parts of uterus: Secondary | ICD-10-CM | POA: Diagnosis not present

## 2021-09-20 DIAGNOSIS — Z0000 Encounter for general adult medical examination without abnormal findings: Secondary | ICD-10-CM | POA: Diagnosis not present

## 2021-09-20 DIAGNOSIS — Z8543 Personal history of malignant neoplasm of ovary: Secondary | ICD-10-CM | POA: Diagnosis not present

## 2021-09-20 DIAGNOSIS — G5701 Lesion of sciatic nerve, right lower limb: Secondary | ICD-10-CM

## 2021-09-20 LAB — POCT URINALYSIS DIP (PROADVANTAGE DEVICE)
Bilirubin, UA: NEGATIVE
Glucose, UA: NEGATIVE mg/dL
Ketones, POC UA: NEGATIVE mg/dL
Leukocytes, UA: NEGATIVE
Nitrite, UA: NEGATIVE
Protein Ur, POC: NEGATIVE mg/dL
Specific Gravity, Urine: 1.015
Urobilinogen, Ur: NEGATIVE
pH, UA: 6 (ref 5.0–8.0)

## 2021-09-20 LAB — LIPID PANEL

## 2021-09-20 MED ORDER — MELOXICAM 15 MG PO TABS: 15.0000 mg | ORAL_TABLET | Freq: Every day | ORAL | 0 refills | Status: DC

## 2021-09-20 MED ORDER — ZOLPIDEM TARTRATE 5 MG PO TABS: ORAL_TABLET | ORAL | 0 refills | Status: DC

## 2021-09-20 MED ORDER — ESCITALOPRAM OXALATE 5 MG PO TABS: 5.0000 mg | ORAL_TABLET | Freq: Every day | ORAL | 3 refills | Status: DC

## 2021-09-21 LAB — COMPREHENSIVE METABOLIC PANEL
ALT: 18 IU/L (ref 0–32)
AST: 22 IU/L (ref 0–40)
Albumin/Globulin Ratio: 2 (ref 1.2–2.2)
Albumin: 4.8 g/dL (ref 3.8–4.8)
Alkaline Phosphatase: 99 IU/L (ref 44–121)
BUN/Creatinine Ratio: 14 (ref 12–28)
BUN: 11 mg/dL (ref 8–27)
Bilirubin Total: 0.8 mg/dL (ref 0.0–1.2)
CO2: 24 mmol/L (ref 20–29)
Calcium: 9.8 mg/dL (ref 8.7–10.3)
Chloride: 99 mmol/L (ref 96–106)
Creatinine, Ser: 0.8 mg/dL (ref 0.57–1.00)
Globulin, Total: 2.4 g/dL (ref 1.5–4.5)
Glucose: 91 mg/dL (ref 70–99)
Potassium: 4.6 mmol/L (ref 3.5–5.2)
Sodium: 139 mmol/L (ref 134–144)
Total Protein: 7.2 g/dL (ref 6.0–8.5)
eGFR: 80 mL/min/{1.73_m2} (ref >59)

## 2021-09-21 LAB — LIPID PANEL
Chol/HDL Ratio: 3.2 ratio (ref 0.0–4.4)
Cholesterol, Total: 253 mg/dL — ABNORMAL HIGH (ref 100–199)
HDL: 78 mg/dL (ref >39)
LDL Chol Calc (NIH): 156 mg/dL — ABNORMAL HIGH (ref 0–99)
Triglycerides: 108 mg/dL (ref 0–149)
VLDL Cholesterol Cal: 19 mg/dL (ref 5–40)

## 2021-09-21 LAB — CBC WITH DIFFERENTIAL/PLATELET
Basophils Absolute: 0 10*3/uL (ref 0.0–0.2)
Basos: 1 %
EOS (ABSOLUTE): 0.1 10*3/uL (ref 0.0–0.4)
Eos: 2 %
Hematocrit: 44.4 % (ref 34.0–46.6)
Hemoglobin: 15.3 g/dL (ref 11.1–15.9)
Immature Grans (Abs): 0 10*3/uL (ref 0.0–0.1)
Immature Granulocytes: 0 %
Lymphocytes Absolute: 1.5 10*3/uL (ref 0.7–3.1)
Lymphs: 27 %
MCH: 31.2 pg (ref 26.6–33.0)
MCHC: 34.5 g/dL (ref 31.5–35.7)
MCV: 90 fL (ref 79–97)
Monocytes Absolute: 0.4 10*3/uL (ref 0.1–0.9)
Monocytes: 7 %
Neutrophils Absolute: 3.5 10*3/uL (ref 1.4–7.0)
Neutrophils: 63 %
Platelets: 205 10*3/uL (ref 150–450)
RBC: 4.91 x10E6/uL (ref 3.77–5.28)
RDW: 12.4 % (ref 11.7–15.4)
WBC: 5.6 10*3/uL (ref 3.4–10.8)

## 2021-09-21 LAB — CA 125: Cancer Antigen (CA) 125: 18.9 U/mL (ref 0.0–38.1)

## 2021-09-21 LAB — VITAMIN D 25 HYDROXY (VIT D DEFICIENCY, FRACTURES): Vit D, 25-Hydroxy: 37.2 ng/mL (ref 30.0–100.0)

## 2021-09-25 ENCOUNTER — Telehealth: Payer: Self-pay | Admitting: Family Medicine

## 2021-10-24 DIAGNOSIS — K649 Unspecified hemorrhoids: Secondary | ICD-10-CM | POA: Diagnosis not present

## 2021-10-24 DIAGNOSIS — Z800 Family history of malignant neoplasm of digestive organs: Secondary | ICD-10-CM | POA: Diagnosis not present

## 2021-10-24 DIAGNOSIS — Z86010 Personal history of colonic polyps: Secondary | ICD-10-CM | POA: Diagnosis not present

## 2021-10-24 DIAGNOSIS — K5730 Diverticulosis of large intestine without perforation or abscess without bleeding: Secondary | ICD-10-CM | POA: Diagnosis not present

## 2021-10-24 LAB — HM COLONOSCOPY

## 2021-10-29 ENCOUNTER — Encounter: Payer: Self-pay | Admitting: *Deleted

## 2021-10-30 ENCOUNTER — Other Ambulatory Visit: Payer: Self-pay | Admitting: Family Medicine

## 2021-10-30 ENCOUNTER — Encounter: Payer: Self-pay | Admitting: Family Medicine

## 2021-10-30 DIAGNOSIS — Z1231 Encounter for screening mammogram for malignant neoplasm of breast: Secondary | ICD-10-CM

## 2021-10-31 ENCOUNTER — Encounter: Payer: Self-pay | Admitting: Family Medicine

## 2021-11-05 ENCOUNTER — Ambulatory Visit
Admission: RE | Admit: 2021-11-05 | Discharge: 2021-11-05 | Disposition: A | Discharge status: Home / Self Care | Payer: BC Managed Care – PPO | Source: Ambulatory Visit | Attending: Family Medicine | Admitting: Family Medicine

## 2021-11-05 DIAGNOSIS — Z1231 Encounter for screening mammogram for malignant neoplasm of breast: Secondary | ICD-10-CM

## 2021-11-19 ENCOUNTER — Telehealth: Payer: Self-pay | Admitting: Family Medicine

## 2021-11-19 DIAGNOSIS — G4700 Insomnia, unspecified: Secondary | ICD-10-CM

## 2021-11-19 MED ORDER — ZOLPIDEM TARTRATE 5 MG PO TABS: ORAL_TABLET | ORAL | 0 refills | Status: DC

## 2021-11-19 NOTE — Telephone Encounter (Signed)
Pharmacy sent refill request for zolpidem please send to the [place], [place] - [id number] ?

## 2021-11-21 NOTE — Telephone Encounter (Signed)
Error

## 2022-01-16 DIAGNOSIS — L308 Other specified dermatitis: Secondary | ICD-10-CM | POA: Diagnosis not present

## 2022-01-16 DIAGNOSIS — L299 Pruritus, unspecified: Secondary | ICD-10-CM | POA: Diagnosis not present

## 2022-01-16 DIAGNOSIS — B353 Tinea pedis: Secondary | ICD-10-CM | POA: Diagnosis not present

## 2022-01-21 ENCOUNTER — Other Ambulatory Visit: Payer: Self-pay | Admitting: Family Medicine

## 2022-01-21 DIAGNOSIS — G4700 Insomnia, unspecified: Secondary | ICD-10-CM

## 2022-01-21 NOTE — Telephone Encounter (Signed)
Is this okay to refill? 

## 2022-03-20 ENCOUNTER — Other Ambulatory Visit: Payer: Self-pay | Admitting: Family Medicine

## 2022-03-20 ENCOUNTER — Encounter: Payer: Self-pay | Admitting: Internal Medicine

## 2022-03-20 DIAGNOSIS — G4700 Insomnia, unspecified: Secondary | ICD-10-CM

## 2022-03-20 NOTE — Telephone Encounter (Signed)
Is this okay to refill? 

## 2022-04-02 ENCOUNTER — Other Ambulatory Visit (INDEPENDENT_AMBULATORY_CARE_PROVIDER_SITE_OTHER): Payer: BC Managed Care – PPO

## 2022-04-02 DIAGNOSIS — Z23 Encounter for immunization: Secondary | ICD-10-CM | POA: Diagnosis not present

## 2022-04-03 ENCOUNTER — Other Ambulatory Visit: Payer: BC Managed Care – PPO

## 2022-04-23 ENCOUNTER — Other Ambulatory Visit: Payer: 59

## 2022-04-23 ENCOUNTER — Encounter: Payer: Self-pay | Admitting: Internal Medicine

## 2022-05-22 ENCOUNTER — Other Ambulatory Visit: Payer: Self-pay | Admitting: Family Medicine

## 2022-05-22 DIAGNOSIS — G4700 Insomnia, unspecified: Secondary | ICD-10-CM

## 2022-05-22 NOTE — Telephone Encounter (Signed)
Is this okay to refill? 

## 2022-07-22 ENCOUNTER — Other Ambulatory Visit: Payer: Self-pay | Admitting: Family Medicine

## 2022-07-22 DIAGNOSIS — G4700 Insomnia, unspecified: Secondary | ICD-10-CM

## 2022-07-22 NOTE — Telephone Encounter (Signed)
Is this okay to refill? 

## 2022-08-06 DIAGNOSIS — L720 Epidermal cyst: Secondary | ICD-10-CM | POA: Diagnosis not present

## 2022-08-06 DIAGNOSIS — L814 Other melanin hyperpigmentation: Secondary | ICD-10-CM | POA: Diagnosis not present

## 2022-08-06 DIAGNOSIS — L570 Actinic keratosis: Secondary | ICD-10-CM | POA: Diagnosis not present

## 2022-08-06 DIAGNOSIS — D2272 Melanocytic nevi of left lower limb, including hip: Secondary | ICD-10-CM | POA: Diagnosis not present

## 2022-08-06 DIAGNOSIS — D225 Melanocytic nevi of trunk: Secondary | ICD-10-CM | POA: Diagnosis not present

## 2022-08-06 DIAGNOSIS — L820 Inflamed seborrheic keratosis: Secondary | ICD-10-CM | POA: Diagnosis not present

## 2022-09-03 DIAGNOSIS — H40033 Anatomical narrow angle, bilateral: Secondary | ICD-10-CM | POA: Diagnosis not present

## 2022-09-03 DIAGNOSIS — H35363 Drusen (degenerative) of macula, bilateral: Secondary | ICD-10-CM | POA: Diagnosis not present

## 2022-09-03 DIAGNOSIS — H2513 Age-related nuclear cataract, bilateral: Secondary | ICD-10-CM | POA: Diagnosis not present

## 2022-09-03 DIAGNOSIS — H02831 Dermatochalasis of right upper eyelid: Secondary | ICD-10-CM | POA: Diagnosis not present

## 2022-09-03 DIAGNOSIS — H524 Presbyopia: Secondary | ICD-10-CM | POA: Diagnosis not present

## 2022-09-11 DIAGNOSIS — M13842 Other specified arthritis, left hand: Secondary | ICD-10-CM | POA: Diagnosis not present

## 2022-09-11 DIAGNOSIS — M13841 Other specified arthritis, right hand: Secondary | ICD-10-CM | POA: Diagnosis not present

## 2022-09-25 NOTE — Patient Instructions (Incomplete)
HEALTH MAINTENANCE RECOMMENDATIONS:  It is recommended that you get at least 30 minutes of aerobic exercise at least 5 days/week (for weight loss, you may need as much as 60-90 minutes). This can be any activity that gets your heart rate up. This can be divided in 10-15 minute intervals if needed, but try and build up your endurance at least once a week.  Weight bearing exercise is also recommended twice weekly.  Eat a healthy diet with lots of vegetables, fruits and fiber.  "Colorful" foods have a lot of vitamins (ie green vegetables, tomatoes, red peppers, etc).  Limit sweet tea, regular sodas and alcoholic beverages, all of which has a lot of calories and sugar.  Up to 1 alcoholic drink daily may be beneficial for women (unless trying to lose weight, watch sugars).  Drink a lot of water.  Calcium recommendations are 1200-1500 mg daily (1500 mg for postmenopausal women or women without ovaries), and vitamin D 1000 IU daily.  This should be obtained from diet and/or supplements (vitamins), and calcium should not be taken all at once, but in divided doses.  Monthly self breast exams and yearly mammograms for women over the age of 49 is recommended.  Sunscreen of at least SPF 30 should be used on all sun-exposed parts of the skin when outside between the hours of 10 am and 4 pm (not just when at beach or pool, but even with exercise, golf, tennis, and yard work!)  Use a sunscreen that says "broad spectrum" so it covers both UVA and UVB rays, and make sure to reapply every 1-2 hours.  Remember to change the batteries in your smoke detectors when changing your clock times in the spring and fall. Carbon monoxide detectors are recommended for your home.  Use your seat belt every time you are in a car, and please drive safely and not be distracted with cell phones and texting while driving.  [facility] to [facility] your bone density test. It is fine if you prefer to wait another  year for your mammogram (getting it every other year, rather than yearly).  I recommend getting the new shingles vaccine (Shingrix). You can get it at our office while you have commercial insurance. You will need to get it from the pharmacy once you are on Medicare.  This is a series of 2 injections, spaced 2 months apart.  It doesn't have to be exactly 2 months apart (but can't be sooner), if that isn't feasible for your schedule, but try and get them close to 2 months (and definitely within 6 months of each other, or else the efficacy of the vaccine drops off). This should be separated from other vaccines by at least 2 weeks.  RSV vaccine is recommended.  I recommend getting it in the Fall with your flu shot. You will need to get this from the pharmacy.  Turmeric, glucosamine and chondroitin, Voltaren gel, and Tylenol Arthritis are options to help treat your hand/wrist arthritis (and back). These treatments may be helpful in avoiding chronic/regular use of oral anti-inflammatories (like the meloxicam prescribed by Dr. Amedeo Plenty), which are not good for your kidneys, and can increase your risk for ulcers. To use these for up to 2 weeks at a time (taken with food, and stopped if it bothers your stomach) is okay, if you have having significant pain/inflammation.  Consider counseling if anxiety isn't as well treated as you'd like, if any stressors occur, etc. We briefly spoke about [facility]  [employer], books, etc).  I encourage you to work on your evening routine, to try and reduce the ambien frequency of use.  Try meditation, Calm App, other relaxation techniques. Consider increasing the lexapro, as this is a symptom of your anxiety (not being able to quiet your mind).  Counseling is also very effective at helping with his.  Consider getting your hearing evaluated, due to your ringing in the ears. We briefly discussed Belsomra as a medication that is safer if needed to be used every day (remember it  works very differently, and might be an adjustment).

## 2022-09-25 NOTE — Progress Notes (Signed)
Chief Complaint  Patient presents with   Annual Exam    Fasting annual exam with pelvic. Will come back for NV for shingrix next week. Does not want covid booster and had RSV virus in Nov. Taking 1/2 tab ambien nightly and needs refill, also on xanax and lexapro. Still having right sided lbp/hip pain, ongoing.    Kristie Howell is a 72 y.o. female who presents for a complete physical.    Wrist and hand arthritis:  She saw Dr. Amedeo Plenty to have this evaluated 2 weeks ago. She was treated with a medrol dosepak, followed by meloxicam. She was given rx with 3 refills. She reports the steroids helped her hands (all but the R pinkie, and she was told she could return for an injection).  Her L thumb pain is still doing much better, has some recurrent pain in the R hand.  She is using meloxicam just prn, not daily.  Denies any abdominal pain.  She is still having R sided LBP and hip pain. We had discussed this at her physical last year-- right sided LB/hip pain, sometimes down the back of the R calf into her foot--worse after prolonged sitting, improved with walking. Today she reports no longer having any pain in the calf or foot, it is limited to the hip/buttock.  The pain comes and goes.  Prednisone resolved the pain, but it came back. She uses the meloxicam just prn, and it helps some.  Anxiety follow-up: She was restarted on lexapro 5mg  in 01/2019 due to worsening anxiety during the pandemic. Dose was increased to 1.5 tablets in October 2021 (had stressors including losing 2 friends, another friend in [place]).  At that time she reported decreased motivation, feeling blue 2-3x/week, trouble falling asleep, sometimes having palpitations. She went up to 1.5 tablets only for 1.5 weeks, didn't like how she felt, and has been doing well on the 5mg  dose since then.   She uses alprazolam only rarely (mainly for the dentist).  Last rx'd #30 in 09/2020. She has taken some with travel.  She finished these related to dental  work, and is requesting a refill. She feels good on the 5 mg of Lexapro--"I like being on it"--it "makes me feel more normal". She does also still have ongoing insomnia, trouble shutting her mind down.  Insomnia--taking ambien 1/2 tablet most nights.  She has trouble falling asleep, especially if she gets a call from one of her kids, something that "gets on my mind". She has had counseling in the past, twice (when on Prozac, not on her current medication). Zolpidem 5mg  was last filled 07/22/22 #30, gets it filled every 2 months. She reports that if she doesn't take ambien, sometimes she will wake up at 3 am and can't get back to sleep. She takes 1/2 tablet most nights, as this helps her fall asleep, and also stay asleep.  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 There was microinvasion on the left side. She last saw Dr. Alycia Rossetti in June 2018, and after CA 125 was normal on 5 year follow-up, she was released from her care.  Pt was told to continue to have Ca-125 monitored. Last done 09/2021, due now.  She denies any pelvic pain, bleeding or other GYN concerns.  She no longer sees a gynecologist (they have all retired).   Hyperlipidemia: Cholesterol was up significantly last year compared to prior check. Due for recheck. Current diet includes red meat  once a week. Infrequent eggs.  Mostly eats chicken and salmon, yogurts for breakfast. Some cheese with wine at night, cheese on salads or sandwiches. No creamy dressings/sauces/soups, no mayonnaise.  Component Ref Range & Units 1 yr ago 3 yr ago 4 yr ago 5 yr ago 7 yr ago 9 yr ago  Cholesterol, Total 100 - 199 mg/dL 253 High  222 High  229 High      Triglycerides 0 - 149 mg/dL 108 150 High  87 74 R 81 R 100 R  HDL >39 mg/dL 78 70 76 79 R 67 R 58  VLDL Cholesterol Cal 5 - 40 mg/dL 19 26 17      LDL Chol Calc (NIH) 0 - 99 mg/dL 156 High  126 High       Chol/HDL Ratio 0.0 - 4.4 ratio 3.2  3.2 CM 3.0 CM 2.9 R 3.1 R 3.6 R     Immunization History  Administered Date(s) Administered   Fluad Quad(high Dose 65+) 03/24/2019, 04/24/2020, 04/02/2022   Influenza Split 05/05/2013   Influenza,inj,Quad PF,6+ Mos 05/16/2014, 04/10/2017   Influenza-Unspecified 05/05/2018, 04/14/2021   Moderna Sars-Covid-2 Vaccination 02/10/2020, 03/21/2020   Pneumococcal Conjugate-13 03/24/2019   Pneumococcal Polysaccharide-23 04/24/2020   Tdap 03/23/2007, 11/18/2016   Zoster, Live 03/28/2013   Declines COVID boosters Had RSV recently (illness, didn't have vaccine) Last Pap smear: 07/2016 through Dr. Alycia Rossetti Last mammogram: 10/2021 Last colonoscopy: 10/2021, hemorrhoids and diverticulosis. 5 yr f/u recommended due to FHx. Last DEXA: never (ordered, scheduled for October, had to cancel, hasn't rescheduled) Ophtho: yearly  Dentist: every 6 months Exercise: Walks with husband in the afternoons 30-45 minutes 3x/week, weather-permitting. Also dances to beach music, less often than in the past. Uses some 2# hand weights and does squats, and holding grandchildren (not lifting them as often, hurts her back). Stretches/exercises on yoga mat some  Vitamin D-OH 37.2 in 09/2021.  Eats yogurt daily for breakfast (4 oz), doesn't drink milk; some cheese.  Other doctors caring for patient include:   GYN-onc Dr. Alycia Rossetti (no longer sees) GYN (Was Dr. Cherylann Banas, retired) Dentist: Dr. Daneil Dolin Ophtho: Dr. Norris Cross: Dr. Rowland Lathe: [place] [facility] GI: Dr Watt Climes      09/08/2019    9:49 AM 09/13/2020    9:41 AM 01/10/2021    9:19 AM 09/20/2021    9:42 AM 09/26/2022    8:39 AM  [place] in the past year? 0 0 0 0 0  Was there an injury with Fall?   0 0 0  Fall Risk Category Calculator   0 0 0  Fall Risk Category (Retired)   Low Low   (RETIRED) Patient Fall Risk Level   Low fall risk Low fall risk   Patient at Risk for Falls Due to   No Fall Risks No Fall Risks No Fall Risks  Fall risk Follow up    Falls evaluation completed Falls evaluation completed Falls evaluation completed      End of Life Discussion:  Patient has a living will and medical power of attorney. She was asked to get [place] a copy, not received yet.   PMH, PSH, SH and FH were reviewed and updated  Outpatient Encounter Medications as of 09/26/2022  Medication Sig Note   escitalopram (LEXAPRO) 5 MG tablet Take 1 tablet (5 mg total) by mouth daily.    meloxicam (MOBIC) 15 MG tablet Take 1 tablet (15 mg total) by mouth daily. Take with food, daily until your pain has resolved  Multiple Vitamin (MULTI VITAMIN PO) Take 1 tablet by mouth daily.    zolpidem (AMBIEN) 5 MG tablet take ONE-HALF TO ONE tablet AT BEDTIME AS NEEDED FOR SLEEP 09/26/2022: 1/2 tablet most nights   ALPRAZolam (XANAX) 0.25 MG tablet Take 1-2 tablets (0.25-0.5 mg total) by mouth 3 (three) times daily as needed for anxiety. (Patient not taking: Reported on 09/26/2022) 09/26/2022: As needed   No facility-administered encounter medications on file as of 09/26/2022.   Allergies  Allergen Reactions   Sulfa Antibiotics Swelling and Rash   Zithromax [Azithromycin] Other (See Comments)    Abdominal pain/cramping     ROS: The patient denies anorexia, fever, weight changes, headaches,  vision changes, decreased hearing, ear pain, sore throat, breast concerns, chest pain, dizziness, syncope, dyspnea on exertion, cough, swelling, nausea, vomiting, diarrhea, constipation, abdominal pain, melena, hematochezia, indigestion/heartburn, hematuria, incontinence, dysuria, vaginal bleeding, discharge, odor or itch, genital lesions; no numbness, tingling, weakness, tremor, suspicious skin lesions, abnormal bleeding/bruising, or enlarged lymph nodes.  Hand/finger arthritis per HPI +anxiety per HPI, and insomnia Some vaginal dryness and discomfort with intercourse No hot flashes, only when she recently took prednisone Ringing in ears since her COVID vaccine, worse in the last  year. R LBP per HPI    PHYSICAL EXAM:  BP 120/70   Pulse 68   Ht 5' 4.5" (1.638 m)   Wt 139 lb 9.6 oz (63.3 kg)   BMI 23.59 kg/m   Wt Readings from Last 3 Encounters:  09/26/22 139 lb 9.6 oz (63.3 kg)  09/20/21 138 lb 6.4 oz (62.8 kg)  01/10/21 136 lb (61.7 kg)    General Appearance:      Alert, cooperative, no distress, appears stated age, in good spirits  Head:      Normocephalic, without obvious abnormality, atraumatic    Eyes:      PERRL, conjunctiva/corneas clear, EOM's intact, fundi benign    Ears:      Normal TM's and external ear canals. Scab/dried blood noted on left, inferiorly in canal (She endorsed recent Q-tip use)  Nose:     No drainage or sinus tenderness  Throat:     Normal mucosa  Neck:     Supple, no lymphadenopathy;  thyroid:  no enlargement/ tenderness/nodules; no carotid bruit or JVD    Back:      No CVA or spinal tenderness. Very mildly tender at R SI joint. No paraspinous muscle tenderness or spasm.  She is tender at gluteal notch and pyriformis muscles, has discomfort with pyriformis stretch. No significant spasm (good ROM)  Lungs:       Clear to auscultation bilaterally without wheezes, rales or ronchi; respirations unlabored    Chest Wall:      No tenderness or deformity     Heart:      Regular rate and rhythm, S1 and S2 normal, no murmur, rub   or gallop    Breast Exam:      No nipple inversion, discharge, skin dimpling or masses.  No axillary lymphadenopathy   Abdomen:       Soft, non-tender, nondistended, normoactive bowel sounds, no masses, no hepatosplenomegaly. WHSS  Genitalia:      Normal external genitalia, without lesions. No abnormal vaginal discharge. Uterus is surgically absent.  No adnexal masses or tenderness  Rectal:      Normal sphincter tone, no masses.  Heme negative stool  Extremities:     No clubbing, cyanosis or edema. Some arthritic changes (hypertrophy of DIP joints) in many  fingers. No warmth or swelling.  Pulses:     2+ and  symmetric all extremities    Skin:     Skin color, texture, turgor normal, no rashes or lesions.  Lymph nodes:     Cervical, supraclavicular, inguinal and axillary nodes normal    Neurologic:     Normal strength, sensation and gait; reflexes 2+ and symmetric throughout. Negative SLR                         Psych:   Normal mood (slightly nervous-appearing), affect, hygiene and grooming      09/26/2022    8:42 AM 09/20/2021    9:43 AM 09/13/2020    9:47 AM 04/24/2020   11:31 AM 04/24/2020   10:43 AM  Depression screen PHQ 2/9  Decreased Interest 0 0 0 1 0  Down, Depressed, Hopeless 0 0 0 1 1  PHQ - 2 Score 0 0 0 2 1  Altered sleeping 2 2 2 3    Tired, decreased energy 0 1 1 2    Change in appetite 0 0 0 0   Feeling bad or failure about yourself  0 0 0 0   Trouble concentrating 0 0 0 0   Moving slowly or fidgety/restless 0 0 0 0   Suicidal thoughts 0 0 0 0   PHQ-9 Score 2 3 3 7    Difficult doing work/chores Not difficult at all Not difficult at all Not difficult at all        09/26/2022    8:43 AM 09/20/2021   10:18 AM 04/24/2020   11:35 AM 09/08/2019   10:45 AM  GAD 7 : Generalized Anxiety Score  Nervous, Anxious, on Edge 0 1 1 1   Control/stop worrying 0 1 3 1   Worry too much - different things 0 1 3 1   Trouble relaxing 0 1 1 1   Restless 0 0 0 0  Easily annoyed or irritable 0 1 2 1   Afraid - awful might happen 0 1 3 1   Total GAD 7 Score 0 6 13 6   Anxiety Difficulty Not difficult at all        ASSESSMENT/PLAN:  Annual physical exam - Plan: Lipid panel, Comprehensive metabolic panel, CBC with Differential/Platelet, TSH, CA 125  Personal history of ovarian cancer - no GU complaints, due for CA 125 - Plan: CA 125  History of endometrial cancer - No bleeding or GU complaints  Osteoarthritis of both hands, unspecified osteoarthritis type - long discussion of NSAID risks and alternatives for OA, which is more chronic and often not inflammatory.  Pure hypercholesterolemia - due  for recheck. Low cholesterol diet reviewed - Plan: Lipid panel  Medication monitoring encounter - Plan: Comprehensive metabolic panel, CBC with Differential/Platelet  Generalized anxiety disorder - on low dose Lexapro; somewhat controlled, but has insomnia related to anxiety. Consider 7.5mg  lexapro trial again, counseling recommended - Plan: ALPRAZolam (XANAX) 0.25 MG tablet, escitalopram (LEXAPRO) 5 MG tablet  Insomnia, unspecified type - anxiety contributing, needing ambien nightly. Encouraged counseling, vs consider increasing lexapro. Belsomra would be safer daily/longterm - Plan: zolpidem (AMBIEN) 5 MG tablet  Chronic right-sided low back pain without sciatica - SI/pyriformis contributing. Got relief from recent prednisone, but is back. Shown stretches. Given chronicity, rec PT. Risks/SE NSAID reviewed - Plan: Ambulatory referral to Physical Therapy  Ca 125, c-met, CBC, lipid, TSH per pt request  Discussed risks/SE of NSAIDs, and how they should be used. Discussed turmeric, glucosamine/chondroitin, Voltaren gel,  and Tylenol arthritis as alternatives to chronic NSAIDs Referred to PT ([facility], [address]) for pyriformis/hip pain, now >1 yr duration.  Counseled in detail regarding insomnia, shutting mind down, anxiety and affect on sleep. Discussed increasing lexapro if techniques we discussed weren't helpful. Prefer her not to use ambien nightly.  Longterm, Belsomra would be safer to be used nightly.  Discussed that counseling (again) could be effective in helping with sleep, shutting mind down, learning more techniques than what I could provide in today's office visit.  Perhaps her family could call earlier in the day, so their stress/discussion not as fresh when going to bed.  Encouraged monthly self breast exams and regular mammograms (she prefers to continue q2 years rather than yearly); at least 30 minutes of aerobic activity at least 5 days/week, weight-bearing exercise at least 2x/week;  proper sunscreen use reviewed; healthy diet, including goals of calcium and vitamin D intake and alcohol recommendations (less than or equal to 1 drink/day) reviewed; regular seatbelt use; changing batteries in smoke detectors.  Immunization recommendations discussed--continue yearly flu shots.  Shingrix is recommended, risks, SE reviewed. She will return soon for NV. RSV vaccine discussed--had illness this year, encouraged her to get from the pharmacy in the Fall. COVID booster discussed, recommended, declined. Colonoscopy recommendations reviewed--UTD. DEXA is recommended.  She was reminded to reschedule this (order was extended).  Patient was asked to get [place] copies of living will and healthcare POA MOST form was not completed, but we did discuss and review that she is Full Code, Full Care  F/u 1 year Sooner if persistent/worsening back pain, anxiety/insomnia or per labs.  This visit was >75 minutes face-to-face, with at least 15 additional minutes spent in chart review, ordering and documentation, with a large portion of this unrelated to wellness issues (insomnia, arthritis, anxiety, hip pain, etc), as is documented in note.

## 2022-09-26 ENCOUNTER — Encounter: Payer: Self-pay | Admitting: Family Medicine

## 2022-09-26 ENCOUNTER — Ambulatory Visit (INDEPENDENT_AMBULATORY_CARE_PROVIDER_SITE_OTHER): Payer: BC Managed Care – PPO | Admitting: Family Medicine

## 2022-09-26 VITALS — BP 120/70 | HR 68 | Ht 64.5 in | Wt 139.6 lb

## 2022-09-26 DIAGNOSIS — E7800 Pure hypercholesterolemia, unspecified: Secondary | ICD-10-CM | POA: Diagnosis not present

## 2022-09-26 DIAGNOSIS — M19041 Primary osteoarthritis, right hand: Secondary | ICD-10-CM | POA: Diagnosis not present

## 2022-09-26 DIAGNOSIS — Z5181 Encounter for therapeutic drug level monitoring: Secondary | ICD-10-CM | POA: Diagnosis not present

## 2022-09-26 DIAGNOSIS — Z8543 Personal history of malignant neoplasm of ovary: Secondary | ICD-10-CM | POA: Diagnosis not present

## 2022-09-26 DIAGNOSIS — Z8542 Personal history of malignant neoplasm of other parts of uterus: Secondary | ICD-10-CM | POA: Diagnosis not present

## 2022-09-26 DIAGNOSIS — Z0000 Encounter for general adult medical examination without abnormal findings: Secondary | ICD-10-CM | POA: Diagnosis not present

## 2022-09-26 DIAGNOSIS — G8929 Other chronic pain: Secondary | ICD-10-CM

## 2022-09-26 DIAGNOSIS — M545 Low back pain, unspecified: Secondary | ICD-10-CM

## 2022-09-26 DIAGNOSIS — G4700 Insomnia, unspecified: Secondary | ICD-10-CM

## 2022-09-26 DIAGNOSIS — M19042 Primary osteoarthritis, left hand: Secondary | ICD-10-CM

## 2022-09-26 DIAGNOSIS — F411 Generalized anxiety disorder: Secondary | ICD-10-CM

## 2022-09-26 MED ORDER — ZOLPIDEM TARTRATE 5 MG PO TABS: ORAL_TABLET | ORAL | 0 refills | Status: DC

## 2022-09-26 MED ORDER — ESCITALOPRAM OXALATE 5 MG PO TABS: 5.0000 mg | ORAL_TABLET | Freq: Every day | ORAL | 3 refills | Status: DC

## 2022-09-26 MED ORDER — ALPRAZOLAM 0.25 MG PO TABS: 0.2500 mg | ORAL_TABLET | Freq: Three times a day (TID) | ORAL | 0 refills | Status: DC | PRN

## 2022-09-27 ENCOUNTER — Encounter: Payer: Self-pay | Admitting: Family Medicine

## 2022-09-27 DIAGNOSIS — E7800 Pure hypercholesterolemia, unspecified: Secondary | ICD-10-CM

## 2022-09-27 LAB — CBC WITH DIFFERENTIAL/PLATELET
Basophils Absolute: 0 10*3/uL (ref 0.0–0.2)
Basos: 1 %
EOS (ABSOLUTE): 0.1 10*3/uL (ref 0.0–0.4)
Eos: 2 %
Hematocrit: 43.7 % (ref 34.0–46.6)
Hemoglobin: 14.7 g/dL (ref 11.1–15.9)
Immature Grans (Abs): 0 10*3/uL (ref 0.0–0.1)
Immature Granulocytes: 0 %
Lymphocytes Absolute: 1.4 10*3/uL (ref 0.7–3.1)
Lymphs: 23 %
MCH: 30.8 pg (ref 26.6–33.0)
MCHC: 33.6 g/dL (ref 31.5–35.7)
MCV: 91 fL (ref 79–97)
Monocytes Absolute: 0.4 10*3/uL (ref 0.1–0.9)
Monocytes: 7 %
Neutrophils Absolute: 4 10*3/uL (ref 1.4–7.0)
Neutrophils: 67 %
Platelets: 186 10*3/uL (ref 150–450)
RBC: 4.78 x10E6/uL (ref 3.77–5.28)
RDW: 12.8 % (ref 11.7–15.4)
WBC: 6 10*3/uL (ref 3.4–10.8)

## 2022-09-27 LAB — LIPID PANEL
Chol/HDL Ratio: 3.5 ratio (ref 0.0–4.4)
Cholesterol, Total: 264 mg/dL — ABNORMAL HIGH (ref 100–199)
HDL: 75 mg/dL (ref >39)
LDL Chol Calc (NIH): 170 mg/dL — ABNORMAL HIGH (ref 0–99)
Triglycerides: 111 mg/dL (ref 0–149)
VLDL Cholesterol Cal: 19 mg/dL (ref 5–40)

## 2022-09-27 LAB — COMPREHENSIVE METABOLIC PANEL
ALT: 26 IU/L (ref 0–32)
AST: 28 IU/L (ref 0–40)
Albumin/Globulin Ratio: 2 (ref 1.2–2.2)
Albumin: 4.6 g/dL (ref 3.9–4.9)
Alkaline Phosphatase: 90 IU/L (ref 44–121)
BUN/Creatinine Ratio: 19 (ref 12–28)
BUN: 16 mg/dL (ref 8–27)
Bilirubin Total: 0.9 mg/dL (ref 0.0–1.2)
CO2: 23 mmol/L (ref 20–29)
Calcium: 9.4 mg/dL (ref 8.7–10.3)
Chloride: 103 mmol/L (ref 96–106)
Creatinine, Ser: 0.84 mg/dL (ref 0.57–1.00)
Globulin, Total: 2.3 g/dL (ref 1.5–4.5)
Glucose: 85 mg/dL (ref 70–99)
Potassium: 4.9 mmol/L (ref 3.5–5.2)
Sodium: 142 mmol/L (ref 134–144)
Total Protein: 6.9 g/dL (ref 6.0–8.5)
eGFR: 75 mL/min/{1.73_m2} (ref >59)

## 2022-09-27 LAB — CA 125: Cancer Antigen (CA) 125: 19.7 U/mL (ref 0.0–38.1)

## 2022-09-27 LAB — TSH: TSH: 2.18 u[IU]/mL (ref 0.450–4.500)

## 2022-10-15 ENCOUNTER — Ambulatory Visit (HOSPITAL_COMMUNITY): Payer: BC Managed Care – PPO

## 2022-10-16 ENCOUNTER — Encounter: Payer: Self-pay | Admitting: Physical Therapy

## 2022-10-16 ENCOUNTER — Ambulatory Visit: Payer: BC Managed Care – PPO | Attending: Family Medicine | Admitting: Physical Therapy

## 2022-10-16 DIAGNOSIS — M545 Low back pain, unspecified: Secondary | ICD-10-CM | POA: Diagnosis not present

## 2022-10-16 DIAGNOSIS — G8929 Other chronic pain: Secondary | ICD-10-CM | POA: Insufficient documentation

## 2022-10-16 DIAGNOSIS — M6281 Muscle weakness (generalized): Secondary | ICD-10-CM | POA: Insufficient documentation

## 2022-10-16 DIAGNOSIS — M5416 Radiculopathy, lumbar region: Secondary | ICD-10-CM | POA: Diagnosis not present

## 2022-10-16 NOTE — Therapy (Signed)
OUTPATIENT PHYSICAL THERAPY THORACOLUMBAR EVALUATION   Patient Name: Kristie Howell MRN: [id number] DOB:09/19/1950, 72 y.o., female Today's Date: 10/16/2022  END OF SESSION:  PT End of Session - 10/16/22 0853     Visit Number 1    Number of Visits 16    Date for PT Re-Evaluation 12/11/22    Authorization Type BCBS    PT Start Time [phone]    PT Stop Time 0935    PT Time Calculation (min) 48 min    Activity Tolerance Patient tolerated treatment well    Behavior During Therapy [facility] for tasks assessed/performed             Past Medical History:  Diagnosis Date   Abnormal TSH    borderline; yearly checks   Anxiety 12-04-11   prone to panic attacks if stressed, heart rate increases   Diverticulosis 02/2011   Hemorrhoids    internal/external seen on colonoscopy 02/2011   Hyperplastic colon polyp 02/2011   Dr. Watt Climes q76yr (+FHx)   Insomnia 2012   Ovarian tumor of borderline malignancy    PMB (postmenopausal bleeding)    PONV (postoperative nausea and vomiting)    Past Surgical History:  Procedure Laterality Date   ABDOMINAL HYSTERECTOMY  11/2011   Total robotic hysterectomy bilateral salpingo-oophorectomy, left pelvic lymph node dissection and left para-aortic lymph node dissection, omental biopsy, peritoneal biopsy   [place]   x2   FINGER SURGERY     left middle finger (same as above)   LYMPH NODE DISSECTION  12/10/2011   Procedure: LYMPH NODE DISSECTION;  Surgeon: Imagene Gurney A. Alycia Rossetti, MD;  Location: [facility];  Service: Gynecology;  Laterality: N/A;  Possible Lymph Nodes   MASS EXCISION  06/26/2012   Procedure: MINOR EXCISION OF MASS;  Surgeon: Cammie Sickle., MD;  Location: [facility];  Service: Orthopedics;  Laterality: Left;  mucoid cyst left long finger    Patient Active Problem List   Diagnosis Date Noted   Anxiety state 10/25/2015   Ovarian neoplasm with low malignant potential 05/20/2013   Pelvic mass in female 12/11/2011   Endometrioid  adenocarcinoma 12/11/2011   Insomnia 10/30/2011   PMB (postmenopausal bleeding)     PCP: Rita Ohara, MD  REFERRING PROVIDER: Rita Ohara, MD  REFERRING DIAG: M54.50,G89.29 (ICD-10-CM) - Chronic right-sided low back pain without sciatica  Rationale for Evaluation and Treatment: Rehabilitation  THERAPY DIAG:  Radiculopathy, lumbar region  Muscle weakness (generalized)  ONSET DATE: > 1 yr chronic   SUBJECTIVE:  SUBJECTIVE STATEMENT: Patient has back pain for over 1 yr .  It is off and on within this time.  She reports that lifting will aggravate her pain, described as sharp and severe in bilateral low back.  She has a general constant ache in Rt side of her back and Rt hip.   She has difficulty sitting long periods of time (even 30 min)  Sitting is worse than standing.  Standing and walking is usually not a problem unless it is excessive. She reports the pain wrapping to Rt ant hip.  She denies sensory changes in Rt LE. She does has a sense of weakness when descending stairs. She denies lower leg pain in the past 9 mos. She has not had PT or other treatment for this condition in the past.    PERTINENT HISTORY:  Sees MD for arthritis in hand.  Anxiety.  PAIN:  Are you having pain? Yes: NPRS scale: 1-2/10 Pain location: Rt hip  Pain description: aching sore Aggravating factors: sitting  Relieving factors: walking, standing, no effect with OTC meds, prednisone, Epson  Was a 7/10-8/10 last night.   PRECAUTIONS: None  WEIGHT BEARING RESTRICTIONS: No  FALLS:  Has patient fallen in last 6 months? No  LIVING ENVIRONMENT: Lives with: lives with their spouse Lives in: House/apartment Stairs: No Has following equipment at home: None  OCCUPATION: Not working.  Stays busy with grandchildren.   PLOF:  Independent  PATIENT GOALS: I want to pick up my 89 yr old grandson.    NEXT MD VISIT: unknown   OBJECTIVE:   DIAGNOSTIC FINDINGS:  None   PATIENT SURVEYS:  FOTO not set up, emailed to patient   SCREENING FOR RED FLAGS: Bowel or bladder incontinence: No Spinal tumors: No Cauda equina syndrome: No Compression fracture: No Abdominal aneurysm: No  COGNITION: Overall cognitive status: Within functional limits for tasks assessed     SENSATION: WFL  MUSCLE LENGTH: Hamstrings: Right WNL deg; Left WNL deg, min tight  Thomas test: Right NT  deg; Left NT  deg  POSTURE: rounded shoulders somewhat decr lordosis  Pt unsure if she has osteoporosis or osteopenia  PALPATION: Soreness along bilateral lumbar paraspinals with increased pain along right L4-L5 and into superior lateral gluteals and piriformis  LUMBAR ROM:   AROM eval  Flexion Min pain knees flexed WNL   Extension WNL felt good  Right lateral flexion Pain to R wants to flex hips   Left lateral flexion Pain   Right rotation WNL   Left rotation WNL    (Blank rows = not tested)  LOWER EXTREMITY ROM:     Active  Right eval Left eval  Hip flexion    Hip extension    Hip abduction    Hip adduction    Hip internal rotation    Hip external rotation    Knee flexion    Knee extension    Ankle dorsiflexion    Ankle plantarflexion    Ankle inversion    Ankle eversion     (Blank rows = not tested)  LOWER EXTREMITY MMT:    MMT Right eval Left eval  Hip flexion 4 pain  4+  Hip extension 4 4  Hip abduction 4 3+  Hip adduction    Hip internal rotation    Hip external rotation    Knee flexion 4+ 4+  Knee extension 5 5  Ankle dorsiflexion    Ankle plantarflexion    Ankle inversion    Ankle eversion     (  Blank rows = not tested)  LUMBAR SPECIAL TESTS:  Straight leg raise test: Negative and Slump test: Positive Back and post hip pain with end range passive SLR  Neck pain with slump test R LE  FABER pos  Rt post hip Scour test pain in Rt ant groin +   FUNCTIONAL TESTS:  5 times sit to stand: 11 sec  2 minute walk test: NT  5 x STS 11 sec  GAIT: Distance walked: 150 Assistive device utilized: None Level of assistance: Mod I Comments: no deviations   TODAY'S TREATMENT:                                                                                                                              DATE: 10/16/22  PT eval, HEP, POC established  HEP instruction with performance, 1 set   PATIENT EDUCATION:  Education details: PT/POC, HEP and positional preference, lumbar pain referral patterns  Person educated: Patient Education method: Explanation, Demonstration, Verbal cues, and Handouts Education comprehension: verbalized understanding, returned demonstration, verbal cues required, and needs further education  HOME EXERCISE PROGRAM: Access Code: [email or web address] URL: [email or web address] Date: 10/16/2022 Prepared by: Raeford Razor  Exercises - Supine Lower Trunk Rotation  - 1-2 x daily - 7 x weekly - 2 sets - 10 reps - 10-15 hold - Supine Single Knee to Chest Stretch  - 1-2 x daily - 7 x weekly - 2 sets - 10 reps - 15-20 hold - Supine Piriformis Stretch with Foot on Ground  - 1-2 x daily - 7 x weekly - 2 sets - 10 reps - 15-20 hold - Clamshell  - 1-2 x daily - 7 x weekly - 2 sets - 15-20 reps  ASSESSMENT:  CLINICAL IMPRESSION: Patient is a 72 y.o. female who was seen today for physical therapy evaluation and treatment for low back pain with referral to R post hip. She responded positively to spine extension motions and had increased pain with Rt hip ER  Weakness in hips evident but Lt hip abduction weaker than Rt. LE.  Cannot rule out right hip OA as a source of pain but pain is more likely due lumbar spine given the aggravating factor of lifting.   OBJECTIVE IMPAIRMENTS: decreased mobility, difficulty walking, decreased ROM, decreased strength, hypomobility, increased fascial  restrictions, impaired flexibility, and pain.   ACTIVITY LIMITATIONS: carrying, lifting, bending, sitting, squatting, sleeping, stairs, transfers, locomotion level, and caring for others  PARTICIPATION LIMITATIONS: interpersonal relationship and driving  PERSONAL FACTORS: Time since onset of injury/illness/exacerbation and 1 comorbidity: anxiety, OA   are also affecting patient's functional outcome.   REHAB POTENTIAL: Excellent  CLINICAL DECISION MAKING: Stable/uncomplicated  EVALUATION COMPLEXITY: Low   GOALS: Goals reviewed with patient? Yes  SHORT TERM GOALS: Target date: 11/13/2022    Pt will be I with HEP for trunk/hip flexibility and core /hip strength  Baseline: given on eval  Goal status: INITIAL  2.  Pt will be able to  sit for 20-30 min with back support and report no increased back pain  Baseline:  Goal status: INITIAL  3.  Pt will complete FOTO and set goal  Baseline: emailed  Goal status: INITIAL  4.  Pt will be able to show good body mechanics, hip hinge with lifting 15 lbs  Baseline: unknown  Goal status: INITIAL   LONG TERM GOALS: Target date: 12/11/2022    Pt will be I HEP upon discharge  Baseline:  Goal status: INITIAL  2.  Pt will be abel to lift grandson from the crib or flor without increased back pain.  Baseline:  Goal status: INITIAL  3.  Pt will increase hip strength to 5/5 bilateral for maximal support with lifting, walking Baseline:  Goal status: INITIAL  4.  FOTO score TBA  Baseline: emailed to pt  Goal status: INITIAL  5.  Pt will be able to sit /drive for 1 hour in order to travel to [place] regularly.  Baseline:  Goal status: INITIAL  PLAN:  PT FREQUENCY: 1-2x/week  PT DURATION: 8 weeks  PLANNED INTERVENTIONS: Therapeutic exercises, Therapeutic activity, Neuromuscular re-education, Balance training, Gait training, Patient/Family education, Self Care, Joint mobilization, Dry Needling, Electrical stimulation, Cryotherapy,  Moist heat, Manual therapy, and Re-evaluation.  PLAN FOR NEXT SESSION: check HEP , neutral/extension bias for core stability . Consider DN to rt piriformis   Rashmi Tallent, PT 10/16/2022, 9:46 AM   Raeford Razor, PT 10/16/22 11:33 AM Phone: [phone] Fax: [phone]

## 2022-11-04 ENCOUNTER — Ambulatory Visit (HOSPITAL_COMMUNITY)
Admission: RE | Admit: 2022-11-04 | Discharge: 2022-11-04 | Disposition: A | Discharge status: Home / Self Care | Payer: BC Managed Care – PPO | Source: Ambulatory Visit | Attending: Family Medicine | Admitting: Family Medicine

## 2022-11-04 DIAGNOSIS — E7800 Pure hypercholesterolemia, unspecified: Secondary | ICD-10-CM | POA: Insufficient documentation

## 2022-11-05 ENCOUNTER — Encounter: Payer: Self-pay | Admitting: Family Medicine

## 2022-11-07 ENCOUNTER — Other Ambulatory Visit: Payer: Self-pay | Admitting: Family Medicine

## 2022-11-07 DIAGNOSIS — E7800 Pure hypercholesterolemia, unspecified: Secondary | ICD-10-CM

## 2022-11-07 MED ORDER — ROSUVASTATIN CALCIUM 10 MG PO TABS: 10.0000 mg | ORAL_TABLET | Freq: Every day | ORAL | 2 refills | Status: AC

## 2022-11-08 ENCOUNTER — Other Ambulatory Visit: Payer: Self-pay | Admitting: *Deleted

## 2022-11-08 DIAGNOSIS — Z5181 Encounter for therapeutic drug level monitoring: Secondary | ICD-10-CM

## 2022-11-08 DIAGNOSIS — E7800 Pure hypercholesterolemia, unspecified: Secondary | ICD-10-CM

## 2022-11-10 DIAGNOSIS — I700 Atherosclerosis of aorta: Secondary | ICD-10-CM | POA: Insufficient documentation

## 2022-11-10 DIAGNOSIS — E7800 Pure hypercholesterolemia, unspecified: Secondary | ICD-10-CM | POA: Insufficient documentation

## 2022-11-10 NOTE — Progress Notes (Signed)
Chief Complaint  Patient presents with   Follow-up    Follow up on results. Right ear pain x 3 weeks. Was some blood in ear one morning. She mentions that she has lots of inflammation in various body parts (hands,hips) and you have discussed before.    Patient presents to discuss her recent CT coronary scan. She is hesitant to start a medication that will cause pain, given that she already has inflammation in her hands, elbows. She reports that 6 day course of steroids really helped her pain. She wonders if her inflammation could make her cholesterol worse. She is concerned about increased pain with taking the medication.  She is also complaining of ear discomfort just on the right ear for the past 3 weeks. She noticed some dried blood in her ear one time (at the beginning, none since). She was noted to have a scab in her ear at her physical last month (had admitted to Qtip use; hasn't used Qtips inside the ear since then). Sometimes it feels like there is fluid, feels plugged sometimes.   Sometimes she has discomfort behind the ear when she turns her head (on the right when she turns her head to the right, but also notices discomfort behind the left ear when she turns to the left). She has had some ringing mainly on the right ear.  Has had some mild in both ears since her COVID vaccine. Denies any congestion or allergy symptoms.  She is also asking about Belsomra, which we briefly discussed at her physical. She reports that sleep is a problem due to pain.  She is taking Tylenol Arthritis at bedtime. She might use it once during the day. She is worried about taking too much and hurting her liver. Voltaren only helps short-term (10 minutes tops) She has trouble falling asleep (sometimes related to pain, other times related to not shutting her mind down/anxiety).  She uses 1/2 tablet of 5mg  ambien with good results--she falls asleep and stays asleep, and isn't groggy the next day.   Recent  studies: Lab Results  Component Value Date   CHOL 264 (H) 09/26/2022   HDL 75 09/26/2022   LDLCALC 170 (H) 09/26/2022   TRIG 111 09/26/2022   CHOLHDL 3.5 09/26/2022   Coronary CT scan: IMPRESSION: 1. Coronary calcium score of 0. This was 1st percentile for age-, race-, and sex-matched controls. Mild atherosclerosis of the aortic root.  Over-read by radiologist was otherwise normal, also noting "minor aortic atherosclerosis of the aortic root"  Due to the presence of atherosclerosis and elevated LDL, patient was asked to start rosuvastatin 10 mg daily. She has been out of town, so hasn't picked up the medication yet.  Also, she wanted to discuss some things before she starts the medication.   PMH, PSH, SH reviewed  Outpatient Encounter Medications as of 11/11/2022  Medication Sig Note   escitalopram (LEXAPRO) 5 MG tablet Take 1 tablet (5 mg total) by mouth daily.    Multiple Vitamin (MULTI VITAMIN PO) Take 1 tablet by mouth daily.    zolpidem (AMBIEN) 5 MG tablet take ONE-HALF TO ONE tablet AT BEDTIME AS NEEDED FOR SLEEP 11/11/2022: 1/3 tablet last night   ALPRAZolam (XANAX) 0.25 MG tablet Take 1-2 tablets (0.25-0.5 mg total) by mouth 3 (three) times daily as needed for anxiety. (Patient not taking: Reported on 11/11/2022) 11/11/2022: As needed   rosuvastatin (CRESTOR) 10 MG tablet Take 1 tablet (10 mg total) by mouth daily. (Patient not taking: Reported on 11/11/2022)  11/11/2022: Has not started yet   [DISCONTINUED] meloxicam (MOBIC) 15 MG tablet Take 1 tablet (15 mg total) by mouth daily. Take with food, daily until your pain has resolved (Patient not taking: Reported on 10/16/2022)    No facility-administered encounter medications on file as of 11/11/2022.   Allergies  Allergen Reactions   Sulfa Antibiotics Swelling and Rash   Zithromax [Azithromycin] Other (See Comments)    Abdominal pain/cramping   ROS: no fever, chills, URI symptoms. No headaches, dizziness, chest pain.  +pain in  hands, elbows per HPI. +insomnia per HPI. Some stress/anxiety. R ear discomfort and neck discomfort per HPI   PHYSICAL EXAM:  BP 120/74   Pulse 72   Temp 98.2 F (36.8 C) (Tympanic)   Ht 5\' 4"  (1.626 m)   Wt 140 lb 12.8 oz (63.9 kg)   BMI 24.17 kg/m   Wt Readings from Last 3 Encounters:  11/11/22 140 lb 12.8 oz (63.9 kg)  09/26/22 139 lb 9.6 oz (63.3 kg)  09/20/21 138 lb 6.4 oz (62.8 kg)   Pleasant, well-appearing female in no distress HEENT: conjunctiva and sclera are clear, EOMI. Nasal mucosa--mild edema bilaterally, pale. No sinus tenderness. OP clear. TM's and EAC's normal.  Neck: Tender at R paraspinous muscles and trapezius, mild +trigger point on R paraspinous. no lymphadenopathy Heart: regular rate and rhythm Lungs: clear   ASSESSMENT/PLAN:  Pure hypercholesterolemia - cont low cholesterol diet. Start CoQ10 and statin (sequentially). Discussed cutting back and contacting us if any SE from statin  Aortic atherosclerosis (HCC) - start statin. All questions answered in detail  Need for shingles vaccine - Plan: Zoster Recombinant (Shingrix )  Acute otalgia, right - reassured normal ear exam. Some muscular component (SCM) and tension in neck. may have some component of ETC; try allergy meds  Insomnia, unspecified type - reviewed risks of ambien; uses small amount. She might struggle with transition to Belsomra. Consider.  Generalized anxiety disorder - contributes to her trouble sleeping. Discussed sleep hygiene, what counseling can offer.  Anxiety/insomnia discussed in detail. To consider counseling. Risks of ambien reviewed--using very low dose. Transition would be hard with belsomra  Concerns re: side effects from statins-- Start CoQ10 daily for 1 week, then start rosuvatstatin daily. (I suggest this only so that you recognize if you have a side effect to the Coenzyme Q10, rather than blaming the rosuvastatin).  Cut back frequency if any side effects develop  (can let it wash out for 1-2 weeks, then restart at just once a week, and every week or two increasing to the highest tolerable dose (whether that be every other day, twice a week, whatever you can tolerate). If you can't tolerate it even just once a week, then we should switch to a different statin.  I spent 50 minutes dedicated to the care of this patient, including pre-visit review of records, face to face time, post-visit ordering of testing and documentation.

## 2022-11-11 ENCOUNTER — Ambulatory Visit (INDEPENDENT_AMBULATORY_CARE_PROVIDER_SITE_OTHER): Payer: BC Managed Care – PPO | Admitting: Family Medicine

## 2022-11-11 ENCOUNTER — Encounter: Payer: Self-pay | Admitting: Family Medicine

## 2022-11-11 VITALS — BP 120/74 | HR 72 | Temp 98.2°F | Ht 64.0 in | Wt 140.8 lb

## 2022-11-11 DIAGNOSIS — I700 Atherosclerosis of aorta: Secondary | ICD-10-CM

## 2022-11-11 DIAGNOSIS — E7800 Pure hypercholesterolemia, unspecified: Secondary | ICD-10-CM | POA: Diagnosis not present

## 2022-11-11 DIAGNOSIS — H9201 Otalgia, right ear: Secondary | ICD-10-CM

## 2022-11-11 DIAGNOSIS — Z23 Encounter for immunization: Secondary | ICD-10-CM

## 2022-11-11 DIAGNOSIS — G4700 Insomnia, unspecified: Secondary | ICD-10-CM

## 2022-11-11 DIAGNOSIS — F411 Generalized anxiety disorder: Secondary | ICD-10-CM

## 2022-11-11 NOTE — Patient Instructions (Addendum)
  Start CoQ10 daily for 1 week, then start rosuvatstatin daily. (I suggest this only so that you recognize if you have a side effect to the Coenzyme Q10, rather than blaming the rosuvastatin).  Cut back frequency if any side effects develop (can let it wash out for 1-2 weeks, then restart at just once a week, and every week or two increasing to the highest tolerable dose (whether that be every other day, twice a week, whatever you can tolerate). If you can't tolerate it even just once a week, then we should switch to a different statin.  Your neck pain is muscular.  Use heat, and do the stretches as shown in the office (and on the handout) twice daily.  Massage will also help. Be mindful of your head/neck position when reading (keep neck neutral).  Your ears are clear. You have slight evidence of some congestion/allergies. You can consider taking a claritin or flonase to see if it helps, if the ear symptoms are bothersome.

## 2022-12-02 ENCOUNTER — Other Ambulatory Visit: Payer: Self-pay | Admitting: Family Medicine

## 2022-12-02 DIAGNOSIS — G4700 Insomnia, unspecified: Secondary | ICD-10-CM

## 2022-12-02 NOTE — Telephone Encounter (Signed)
Is this ok to refill?  

## 2022-12-10 ENCOUNTER — Other Ambulatory Visit: Payer: Self-pay | Admitting: *Deleted

## 2022-12-10 ENCOUNTER — Encounter: Payer: Self-pay | Admitting: Family Medicine

## 2022-12-10 DIAGNOSIS — M79642 Pain in left hand: Secondary | ICD-10-CM

## 2022-12-10 DIAGNOSIS — M19042 Primary osteoarthritis, left hand: Secondary | ICD-10-CM

## 2023-01-27 ENCOUNTER — Other Ambulatory Visit (INDEPENDENT_AMBULATORY_CARE_PROVIDER_SITE_OTHER): Payer: BC Managed Care – PPO

## 2023-01-27 DIAGNOSIS — Z23 Encounter for immunization: Secondary | ICD-10-CM | POA: Diagnosis not present

## 2023-01-28 ENCOUNTER — Other Ambulatory Visit: Payer: Self-pay | Admitting: Family Medicine

## 2023-01-28 DIAGNOSIS — G4700 Insomnia, unspecified: Secondary | ICD-10-CM

## 2023-01-28 NOTE — Telephone Encounter (Signed)
 Is this okay to refill? 

## 2023-02-14 DIAGNOSIS — M545 Low back pain, unspecified: Secondary | ICD-10-CM | POA: Diagnosis not present

## 2023-02-14 DIAGNOSIS — M79641 Pain in right hand: Secondary | ICD-10-CM | POA: Diagnosis not present

## 2023-02-14 DIAGNOSIS — Z79899 Other long term (current) drug therapy: Secondary | ICD-10-CM | POA: Diagnosis not present

## 2023-02-14 DIAGNOSIS — M549 Dorsalgia, unspecified: Secondary | ICD-10-CM | POA: Diagnosis not present

## 2023-02-14 DIAGNOSIS — M2550 Pain in unspecified joint: Secondary | ICD-10-CM | POA: Diagnosis not present

## 2023-02-14 DIAGNOSIS — M2540 Effusion, unspecified joint: Secondary | ICD-10-CM | POA: Diagnosis not present

## 2023-02-14 LAB — LAB REPORT - SCANNED: EGFR: 94

## 2023-02-19 DIAGNOSIS — M5136 Other intervertebral disc degeneration, lumbar region: Secondary | ICD-10-CM | POA: Diagnosis not present

## 2023-02-19 DIAGNOSIS — M5126 Other intervertebral disc displacement, lumbar region: Secondary | ICD-10-CM | POA: Diagnosis not present

## 2023-02-19 DIAGNOSIS — M79642 Pain in left hand: Secondary | ICD-10-CM | POA: Diagnosis not present

## 2023-02-19 DIAGNOSIS — M79641 Pain in right hand: Secondary | ICD-10-CM | POA: Diagnosis not present

## 2023-02-19 DIAGNOSIS — M47816 Spondylosis without myelopathy or radiculopathy, lumbar region: Secondary | ICD-10-CM | POA: Diagnosis not present

## 2023-02-19 DIAGNOSIS — M7989 Other specified soft tissue disorders: Secondary | ICD-10-CM | POA: Diagnosis not present

## 2023-03-03 DIAGNOSIS — M2550 Pain in unspecified joint: Secondary | ICD-10-CM | POA: Diagnosis not present

## 2023-03-03 DIAGNOSIS — M154 Erosive (osteo)arthritis: Secondary | ICD-10-CM | POA: Diagnosis not present

## 2023-03-03 DIAGNOSIS — M79644 Pain in right finger(s): Secondary | ICD-10-CM | POA: Diagnosis not present

## 2023-03-03 DIAGNOSIS — M2540 Effusion, unspecified joint: Secondary | ICD-10-CM | POA: Diagnosis not present

## 2023-03-19 ENCOUNTER — Other Ambulatory Visit: Payer: Self-pay | Admitting: Family Medicine

## 2023-03-19 DIAGNOSIS — G4700 Insomnia, unspecified: Secondary | ICD-10-CM

## 2023-03-19 NOTE — Telephone Encounter (Signed)
Is this okay to refill? 

## 2023-04-23 DIAGNOSIS — M2550 Pain in unspecified joint: Secondary | ICD-10-CM | POA: Diagnosis not present

## 2023-04-23 DIAGNOSIS — M154 Erosive (osteo)arthritis: Secondary | ICD-10-CM | POA: Diagnosis not present

## 2023-04-23 DIAGNOSIS — M545 Low back pain, unspecified: Secondary | ICD-10-CM | POA: Diagnosis not present

## 2023-05-19 ENCOUNTER — Other Ambulatory Visit: Payer: Self-pay | Admitting: Family Medicine

## 2023-05-19 DIAGNOSIS — G4700 Insomnia, unspecified: Secondary | ICD-10-CM

## 2023-05-19 NOTE — Telephone Encounter (Signed)
Is this okay to refill? 

## 2023-07-17 ENCOUNTER — Other Ambulatory Visit: Payer: Self-pay | Admitting: Family Medicine

## 2023-07-17 DIAGNOSIS — G4700 Insomnia, unspecified: Secondary | ICD-10-CM

## 2023-07-17 NOTE — Telephone Encounter (Signed)
 Is this okay to refill?

## 2023-09-11 ENCOUNTER — Other Ambulatory Visit: Payer: Self-pay | Admitting: Family Medicine

## 2023-09-11 DIAGNOSIS — G4700 Insomnia, unspecified: Secondary | ICD-10-CM

## 2023-09-11 MED ORDER — ZOLPIDEM TARTRATE 5 MG PO TABS
ORAL_TABLET | ORAL | 0 refills | Status: DC
Start: 2023-09-11 — End: 2023-09-12

## 2023-09-11 NOTE — Telephone Encounter (Signed)
 Is this okay to refill?

## 2023-09-11 NOTE — Addendum Note (Signed)
 Addended by: Joselyn Arrow on: 09/11/2023 04:42 PM   Modules accepted: Orders

## 2023-09-12 MED ORDER — ZOLPIDEM TARTRATE 5 MG PO TABS
ORAL_TABLET | ORAL | 0 refills | Status: DC
Start: 2023-09-12 — End: 2023-10-28

## 2023-09-12 NOTE — Addendum Note (Signed)
 Addended by: Joselyn Arrow on: 09/12/2023 09:59 AM   Modules accepted: Orders

## 2023-09-12 NOTE — Telephone Encounter (Signed)
 This failed twice (sent twice yesterday). I'll try again--but if it comes back as failed today, somebody will need to contact the pharmacy (and the patient).

## 2023-09-28 NOTE — Patient Instructions (Incomplete)
 HEALTH MAINTENANCE RECOMMENDATIONS:  It is recommended that you get at least 30 minutes of aerobic exercise at least 5 days/week (for weight loss, you may need as much as 60-90 minutes). This can be any activity that gets your heart rate up. This can be divided in 10-15 minute intervals if needed, but try and build up your endurance at least once a week.  Weight bearing exercise is also recommended twice weekly.  Eat a healthy diet with lots of vegetables, fruits and fiber.  "Colorful" foods have a lot of vitamins (ie green vegetables, tomatoes, red peppers, etc).  Limit sweet tea, regular sodas and alcoholic beverages, all of which has a lot of calories and sugar.  Up to 1 alcoholic drink daily may be beneficial for women (unless trying to lose weight, watch sugars).  Drink a lot of water.  Calcium recommendations are 1200-1500 mg daily (1500 mg for postmenopausal women or women without ovaries), and vitamin D 1000 IU daily.  This should be obtained from diet and/or supplements (vitamins), and calcium should not be taken all at once, but in divided doses.  Monthly self breast exams and yearly mammograms for women over the age of 44 is recommended.  Sunscreen of at least SPF 30 should be used on all sun-exposed parts of the skin when outside between the hours of 10 am and 4 pm (not just when at beach or pool, but even with exercise, golf, tennis, and yard work!)  Use a sunscreen that says "broad spectrum" so it covers both UVA and UVB rays, and make sure to reapply every 1-2 hours.  Remember to change the batteries in your smoke detectors when changing your clock times in the spring and fall. Carbon monoxide detectors are recommended for your home.  Use your seat belt every time you are in a car, and please drive safely and not be distracted with cell phones and texting while driving.  I would use Tylenol more regularly, since you are dealing with pain throughout the day (and night). Just don't  take more than the bottle says.  Tylenol Arthritis has a longer duration of action.  If your back pain isn't improving with physical therapy, you likely need to have an MRI.  You may benefit from an epidural injection (or a consult with neurosurgeon after the MRI to discuss all options).   Let us know if you need referral locally, vs if you plan to do this in [place] (you can contact the rheumatologist for the referral there).  Consider counseling to help with anxiety and insomnia.  We discussed measures to help prevent dementia (since you worry about this). Physical exercise, mental exercises (ie crossword puzzles, etc), Mediterranean Diet, getting hearing evaluated/treated if problems are detected, community involvement.  Please get your hearing checked (suggested locations are [facility] or [facility]).   Please start Coenzyme Q10 daily for a week. Then start the rosuvastatin.  If you are doing well on rosuvastatin and don't have any new muscle aches, then you can at some point try stopping the coenzyme Q10. If you develop muscle pain, restart the CoQ10 (which is what can help prevent it).   Please schedule your mammogram and bone density test. Updated pneumonia vaccine is recommended--either Prevnar-20 or Capvaxive.  You can get Prevnar-20 from our office or the pharmacy, The other is currently only available at certain pharmacies (it is new). You should get this by the Fall (5 years since your last prevnar vaccine)  Continue yearly flu  shots. Consider COVID booster. RSV vaccine is recommended--at this point, since so late in the season, okay to wait until the Fall (can get along with your next flu shot). You need to get this from the pharmacy.  Please bring [place] copies of your Living Will and Healthcare Power of Attorney once completed and notarized so that it can be scanned into your medical chart.

## 2023-09-28 NOTE — Progress Notes (Unsigned)
 No chief complaint on file.  Kristie Howell is a 73 y.o. female who presents for Welcome to Medicare physical, and follow-up on chronic problems.  Wrist and hand arthritis:  She has seen Dr. Amanda Pea (previously treated with steroid pack, meloxicam). She saw rheumatologist in [place] and had injection of R 5th PIP in 03/2023 with good results. 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 They had discussed possible use of methotrexate, wasn't interested. Taking tylenol 1000 mg BID, and advised she could use naproxen 440 mg BID along with it, if needed. They had also discussed her LBP, and referred her back for PT. We previously discussed R sided LBP and hip pain.  The pain comes and goes.  Prednisone previously resolved the pain, but it came back. some.  Anxiety follow-up: She was restarted on lexapro 5mg  in 01/2019 due to worsening anxiety during the pandemic. Dose was increased to 1.5 tablets in October 2021 (had stressors including losing 2 friends, another friend in MVA).  At that time she reported decreased motivation, feeling blue 2-3x/week, trouble falling asleep, sometimes having palpitations. She went up to 1.5 tablets only for 1.5 weeks, didn't like how she felt, and has been doing well on the 5mg  dose since then.   She feels good on the 5 mg of Lexapro--"I like being on it"--it "makes me feel more normal". She uses alprazolam only rarely (mainly for the dentist, some with travel.)  Last refilled #30 on 09/26/22.[name] 1/2 tablet most nights.  She has trouble falling asleep, especially if she gets a call from one of her kids, something that "gets on my mind". She has had counseling in the past, twice (when on Prozac, not on her current medication). She reports that if she doesn't take ambien, sometimes she will wake up at 3 am and can't get back to sleep. She takes 1/2 tablet most nights, as this helps her fall asleep, and also stay asleep.  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 There was microinvasion on the left side. She last saw Dr. Duard Brady in June 2018, and after CA 125 was normal on 5 year follow-up, she was released from her care.  Pt was told to continue to have Ca-125 monitored, due now.  She denies any pelvic pain, bleeding or other GYN concerns.  She no longer sees a gynecologist (they have all retired).   Hyperlipidemia: She had calcium score of 0, but was noted to have aortic atherosclerosis on CT done 10/2022. We discussed this at visit, and rosuvastatin was prescribed, but she admits that she never started this. (We had discussed in detail at 10/2022 visit, to start CoQ10 for a week, then add rosuvstatin). She is due for recheck of lipids.  When last checked, diet included red meat once a week. Infrequent eggs.  Mostly eats chicken and salmon, yogurts for breakfast. Some cheese with wine at night, cheese on salads or sandwiches. No creamy dressings/sauces/soups, no mayonnaise. Denies changes to diet.  Lab Results  Component Value Date   CHOL 264 (H) 09/26/2022   HDL 75 09/26/2022   LDLCALC 170 (H) 09/26/2022   TRIG 111 09/26/2022   CHOLHDL 3.5 09/26/2022     Immunization History  Administered Date(s) Administered   Fluad Quad(high Dose 65+) 03/24/2019, 04/24/2020, 04/02/2022   Influenza Split 05/05/2013   Influenza,inj,Quad PF,6+ Mos 05/16/2014, 04/10/2017   Influenza-Unspecified 05/05/2018, 04/14/2021   Moderna Sars-Covid-2 Vaccination 02/10/2020, 03/21/2020   Pneumococcal  Conjugate-13 03/24/2019   Pneumococcal Polysaccharide-23 04/24/2020   Tdap 03/23/2007, 11/18/2016   Zoster Recombinant(Shingrix) 11/11/2022, 01/27/2023   Zoster, Live 03/28/2013   Declines COVID boosters Last Pap smear: 07/2016 through Dr. Duard Brady Last mammogram: 10/2021 (prefers q2 yrs, not yearly) Last colonoscopy: 10/2021, hemorrhoids and diverticulosis. 5 yr f/u recommended due to FHx. Last  DEXA: never (ordered, scheduled for October, had to cancel, hasn't rescheduled) Ophtho: yearly  Dentist: every 6 months Exercise: Walks with husband in the afternoons 30-45 minutes 3x/week, weather-permitting. Also dances to beach music, less often than in the past. Uses some 2# hand weights and does squats, and holding grandchildren (not lifting them as often, hurts her back). Stretches/exercises on yoga mat some  Vitamin D-OH 37.2 in 09/2021.  Eats yogurt daily for breakfast (4 oz), doesn't drink milk; some cheese.   Patient Care Team: Joselyn Arrow, MD as PCP - General (Family Medicine) Dimitri Ped, MD as Referring Physician (Surgery) Dermatology, Ilean China, Vernia Buff, MD as Consulting Physician (Gastroenterology) Dr. Jodean Lima as Consulting Physician (Dentistry) Dominica Severin, MD as Consulting Physician (Orthopedic Surgery) Diona Foley, MD as Consulting Physician (Ophthalmology) [place], Amy, MD as Consulting Physician (Dermatology)   Depression Screening: Flowsheet Row Office Visit from 09/26/2022 in [facility]  PHQ-2 Total Score 0       Flowsheet Row Office Visit from 09/26/2022 in [facility]  PHQ-9 Total Score 2       Falls screen:     11/11/2022   11:05 AM 09/26/2022    8:39 AM 09/20/2021    9:42 AM 01/10/2021    9:19 AM 09/13/2020    9:41 AM  Fall Risk   Falls in the past year? 0 0 0 0 0  Number falls in past yr: 0 0 0 0   Injury with Fall? 0 0 0 0   Risk for fall due to : No Fall Risks No Fall Risks No Fall Risks No Fall Risks   Follow up Falls evaluation completed Falls evaluation completed Falls evaluation completed Falls evaluation completed      Functional Status Survey:        End of Life Discussion:  Patient has a living will and medical power of attorney. She was asked to get [place] a copy, not received yet.   PMH, PSH, SH and FH were reviewed and updated      ROS: The patient denies anorexia, fever, weight  changes, headaches,  vision changes, decreased hearing, ear pain, sore throat, breast concerns, chest pain, dizziness, syncope, dyspnea on exertion, cough, swelling, nausea, vomiting, diarrhea, constipation, abdominal pain, melena, hematochezia, indigestion/heartburn, hematuria, incontinence, dysuria, vaginal bleeding, discharge, odor or itch, genital lesions; no numbness, tingling, weakness, tremor, suspicious skin lesions, abnormal bleeding/bruising, or enlarged lymph nodes.  Hand/finger arthritis per HPI +anxiety per HPI, and insomnia Some vaginal dryness and discomfort with intercourse No hot flashes, only when she recently took prednisone Ringing in ears since her COVID vaccine, worse in the last year. R LBP per HPI    PHYSICAL EXAM:  There were no vitals taken for this visit.  Wt Readings from Last 3 Encounters:  11/11/22 140 lb 12.8 oz (63.9 kg)  09/26/22 139 lb 9.6 oz (63.3 kg)  09/20/21 138 lb 6.4 oz (62.8 kg)    General Appearance:      Alert, cooperative, no distress, appears stated age, in good spirits  Head:      Normocephalic, without obvious abnormality, atraumatic    Eyes:  PERRL, conjunctiva/corneas clear, EOM's intact, fundi benign    Ears:      Normal TM's and external ear canals. Scab/dried blood noted on left, inferiorly in canal (She endorsed recent Q-tip use)  Nose:     No drainage or sinus tenderness  Throat:     Normal mucosa  Neck:     Supple, no lymphadenopathy;  thyroid:  no enlargement/ tenderness/nodules; no carotid bruit or JVD    Back:      No CVA or spinal tenderness. Very mildly tender at R SI joint. No paraspinous muscle tenderness or spasm.  She is tender at gluteal notch and pyriformis muscles, has discomfort with pyriformis stretch. No significant spasm (good ROM)  Lungs:       Clear to auscultation bilaterally without wheezes, rales or ronchi; respirations unlabored    Chest Wall:      No tenderness or deformity     Heart:      Regular rate  and rhythm, S1 and S2 normal, no murmur, rub   or gallop    Breast Exam:      No nipple inversion, discharge, skin dimpling or masses.  No axillary lymphadenopathy   Abdomen:       Soft, non-tender, nondistended, normoactive bowel sounds, no masses, no hepatosplenomegaly. WHSS  Genitalia:      Normal external genitalia, without lesions. No abnormal vaginal discharge. Uterus is surgically absent.  No adnexal masses or tenderness  Rectal:      Normal sphincter tone, no masses.  Heme negative stool  Extremities:     No clubbing, cyanosis or edema. Some arthritic changes (hypertrophy of DIP joints) in many fingers. No warmth or swelling.  Pulses:     2+ and symmetric all extremities    Skin:     Skin color, texture, turgor normal, no rashes or lesions.  Lymph nodes:     Cervical, supraclavicular, inguinal and axillary nodes normal    Neurologic:     Normal strength, sensation and gait; reflexes 2+ and symmetric throughout. Negative SLR                         Psych:   Normal mood (slightly nervous-appearing), affect, hygiene and grooming      09/26/2022    8:42 AM 09/20/2021    9:43 AM 09/13/2020    9:47 AM 04/24/2020   11:31 AM 04/24/2020   10:43 AM  Depression screen PHQ 2/9  Decreased Interest 0 0 0 1 0  Down, Depressed, Hopeless 0 0 0 1 1  PHQ - 2 Score 0 0 0 2 1  Altered sleeping 2 2 2 3    Tired, decreased energy 0 1 1 2    Change in appetite 0 0 0 0   Feeling bad or failure about yourself  0 0 0 0   Trouble concentrating 0 0 0 0   Moving slowly or fidgety/restless 0 0 0 0   Suicidal thoughts 0 0 0 0   PHQ-9 Score 2 3 3 7    Difficult doing work/chores Not difficult at all Not difficult at all Not difficult at all        09/26/2022    8:43 AM 09/20/2021   10:18 AM 04/24/2020   11:35 AM 09/08/2019   10:45 AM  GAD 7 : Generalized Anxiety Score  Nervous, Anxious, on Edge 0 1 1 1   Control/stop worrying 0 1 3 1   Worry too much - different things 0 1  3 1  Trouble relaxing 0 1 1 1    Restless 0 0 0 0  Easily annoyed or irritable 0 1 2 1   Afraid - awful might happen 0 1 3 1   Total GAD 7 Score 0 6 13 6   Anxiety Difficulty Not difficult at all        ASSESSMENT/PLAN:  PHQ and GAD7 Did she have flu shot? RSV vaccine? Likely declines COVID (decline) Prevnar-20 recommended  Mammo due (she prefers q2 yrs, last 10/2021), ensure scheduled. DEXA order was extended--ordered in 2023, never done,  Remind (again) to schedule  Last year-- Ca 125, c-met, CBC, lipid, HepC  TSH if sx  Encouraged monthly self breast exams and regular mammograms (she prefers to continue q2 years rather than yearly); at least 30 minutes of aerobic activity at least 5 days/week, weight-bearing exercise at least 2x/week; proper sunscreen use reviewed; healthy diet, including goals of calcium and vitamin D intake and alcohol recommendations (less than or equal to 1 drink/day) reviewed; regular seatbelt use; changing batteries in smoke detectors.  Immunization recommendations discussed--continue yearly flu shots.  Prevnar-20 *** RSV vaccine *** COVID booster discussed, recommended, declined. Colonoscopy recommendations reviewed--UTD. DEXA is recommended.  She was reminded (again) to reschedule this (order was extended).  Patient was asked to get [place] copies of living will and healthcare POA MOST form Full Code, Full Care  F/u 1 year Sooner if persistent/worsening back pain, anxiety/insomnia or per labs.   Medicare Attestation I have personally reviewed: The patient's medical and social history Their use of alcohol, tobacco or illicit drugs Their current medications and supplements The patient's functional ability including ADLs,fall risks, home safety risks, cognitive, and hearing and visual impairment Diet and physical activities Evidence for depression or mood disorders  The patient's weight, height, BMI have been recorded in the chart.  I have made referrals, counseling, and provided  education to the patient based on review of the above and I have provided the patient with a written personalized care plan for preventive services.     Lavonda Jumbo, MD

## 2023-09-29 DIAGNOSIS — M154 Erosive (osteo)arthritis: Secondary | ICD-10-CM | POA: Insufficient documentation

## 2023-10-01 ENCOUNTER — Encounter: Payer: Self-pay | Admitting: Family Medicine

## 2023-10-01 ENCOUNTER — Ambulatory Visit (INDEPENDENT_AMBULATORY_CARE_PROVIDER_SITE_OTHER): Payer: BC Managed Care – PPO | Admitting: Family Medicine

## 2023-10-01 VITALS — BP 120/70 | HR 72 | Ht 65.0 in | Wt 139.4 lb

## 2023-10-01 DIAGNOSIS — M19042 Primary osteoarthritis, left hand: Secondary | ICD-10-CM

## 2023-10-01 DIAGNOSIS — Z5181 Encounter for therapeutic drug level monitoring: Secondary | ICD-10-CM | POA: Diagnosis not present

## 2023-10-01 DIAGNOSIS — Z0000 Encounter for general adult medical examination without abnormal findings: Secondary | ICD-10-CM

## 2023-10-01 DIAGNOSIS — G4700 Insomnia, unspecified: Secondary | ICD-10-CM

## 2023-10-01 DIAGNOSIS — F411 Generalized anxiety disorder: Secondary | ICD-10-CM

## 2023-10-01 DIAGNOSIS — Z8543 Personal history of malignant neoplasm of ovary: Secondary | ICD-10-CM

## 2023-10-01 DIAGNOSIS — M154 Erosive (osteo)arthritis: Secondary | ICD-10-CM

## 2023-10-01 DIAGNOSIS — Z8542 Personal history of malignant neoplasm of other parts of uterus: Secondary | ICD-10-CM | POA: Diagnosis not present

## 2023-10-01 DIAGNOSIS — E7800 Pure hypercholesterolemia, unspecified: Secondary | ICD-10-CM | POA: Diagnosis not present

## 2023-10-01 DIAGNOSIS — I700 Atherosclerosis of aorta: Secondary | ICD-10-CM | POA: Diagnosis not present

## 2023-10-01 LAB — LIPID PANEL
Chol/HDL Ratio: 3 ratio (ref 0.0–4.4)
Cholesterol, Total: 249 mg/dL — ABNORMAL HIGH (ref 100–199)
HDL: 82 mg/dL (ref >39)
LDL Chol Calc (NIH): 151 mg/dL — ABNORMAL HIGH (ref 0–99)
Triglycerides: 96 mg/dL (ref 0–149)
VLDL Cholesterol Cal: 16 mg/dL (ref 5–40)

## 2023-10-01 LAB — COMPREHENSIVE METABOLIC PANEL
ALT: 28 IU/L (ref 0–32)
AST: 29 IU/L (ref 0–40)
Albumin: 4.7 g/dL (ref 3.9–4.9)
Alkaline Phosphatase: 98 IU/L (ref 44–121)
BUN/Creatinine Ratio: 16 (ref 12–28)
BUN: 13 mg/dL (ref 8–27)
Bilirubin Total: 0.7 mg/dL (ref 0.0–1.2)
CO2: 22 mmol/L (ref 20–29)
Calcium: 9.6 mg/dL (ref 8.7–10.3)
Chloride: 102 mmol/L (ref 96–106)
Creatinine, Ser: 0.79 mg/dL (ref 0.57–1.00)
Globulin, Total: 2.2 g/dL (ref 1.5–4.5)
Glucose: 90 mg/dL (ref 70–99)
Potassium: 4.3 mmol/L (ref 3.5–5.2)
Sodium: 141 mmol/L (ref 134–144)
Total Protein: 6.9 g/dL (ref 6.0–8.5)
eGFR: 80 mL/min/{1.73_m2} (ref >59)

## 2023-10-01 MED ORDER — ESCITALOPRAM OXALATE 5 MG PO TABS
5.0000 mg | ORAL_TABLET | Freq: Every day | ORAL | 3 refills | Status: AC
Start: 2023-10-01 — End: ?

## 2023-10-02 ENCOUNTER — Encounter: Payer: Self-pay | Admitting: Family Medicine

## 2023-10-02 NOTE — Progress Notes (Signed)
 Ca-125 added per lab.

## 2023-10-03 ENCOUNTER — Encounter: Payer: Self-pay | Admitting: Family Medicine

## 2023-10-03 LAB — SPECIMEN STATUS REPORT

## 2023-10-03 LAB — CA 125: Cancer Antigen (CA) 125: 16.8 U/mL (ref 0.0–38.1)

## 2023-10-14 IMAGING — MG MM DIGITAL SCREENING BILAT W/ TOMO AND CAD
8 series · 9 of 24 positions shown · non-contrast
Comparison: Previous exam(s).

CLINICAL DATA: Screening.

EXAM:
DIGITAL SCREENING BILATERAL MAMMOGRAM WITH TOMOSYNTHESIS AND CAD
TECHNIQUE: Bilateral screening digital craniocaudal and mediolateral oblique
mammograms were obtained. Bilateral screening digital breast
tomosynthesis was performed. The images were evaluated with
computer-aided detection.

[L MLO synth-2D]
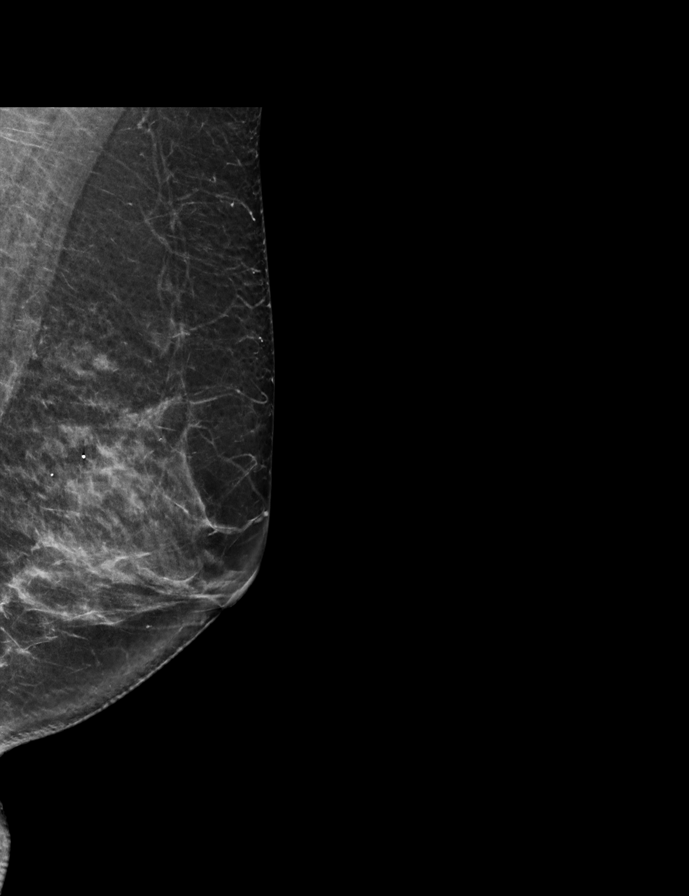

[L CC synth-2D]
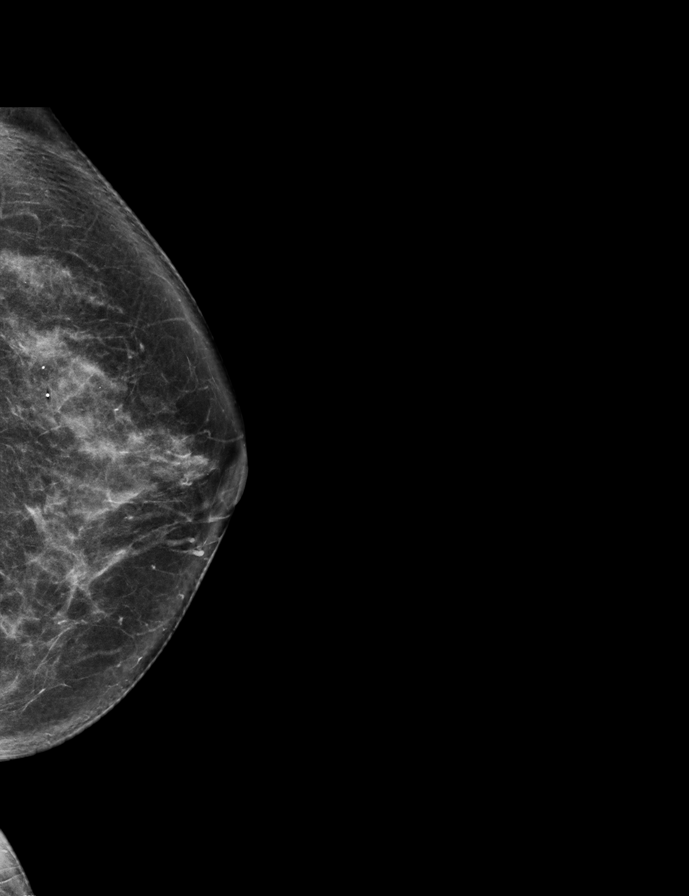

[R CC synth-2D]
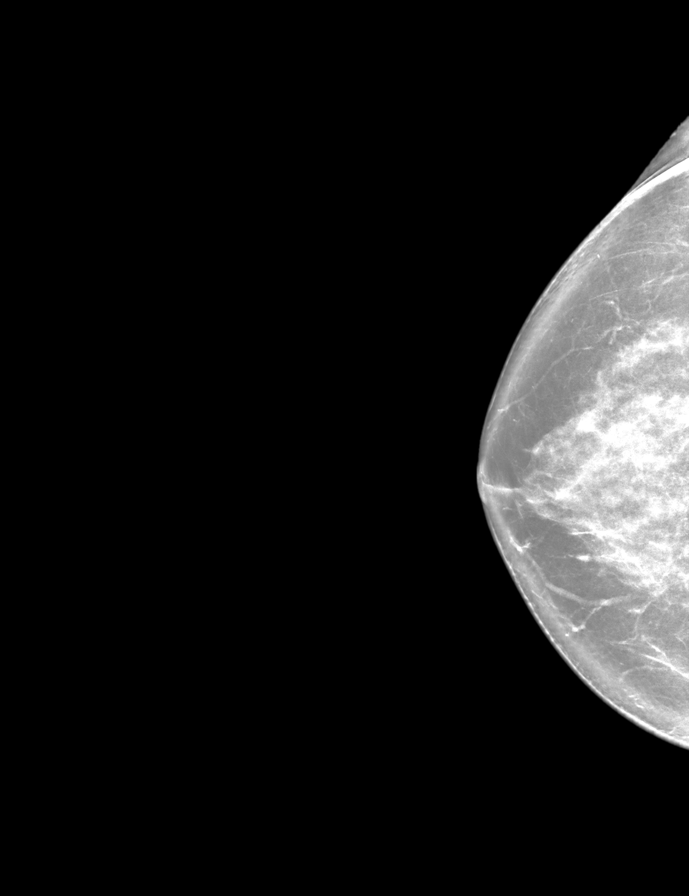

[R MLO synth-2D]
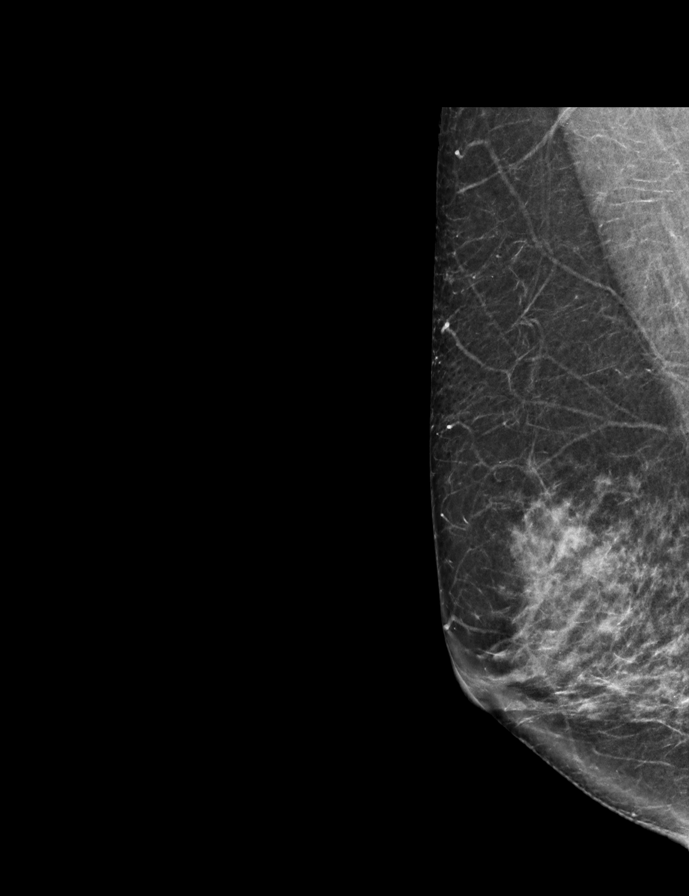

[R CC tomo · 2 of 77 frames shown]
[frame 25/77]
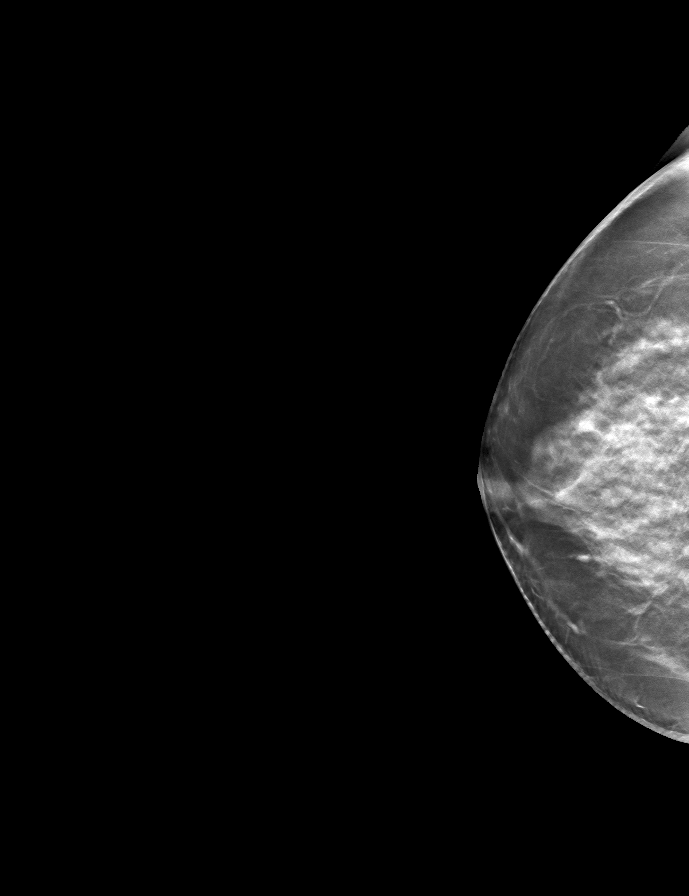
[frame 39/77]
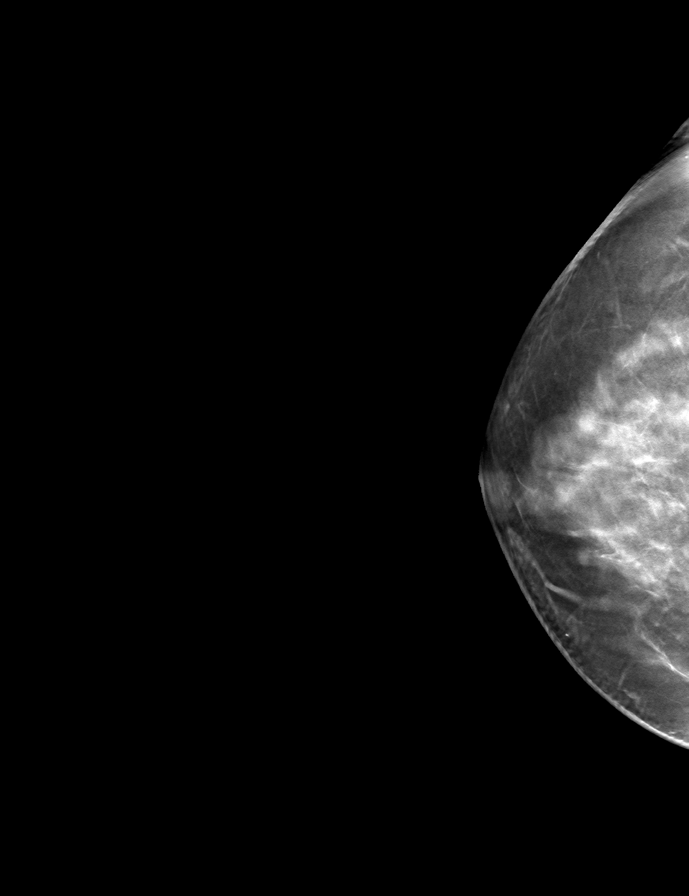

[L MLO tomo · tomo slice 35/70.0]
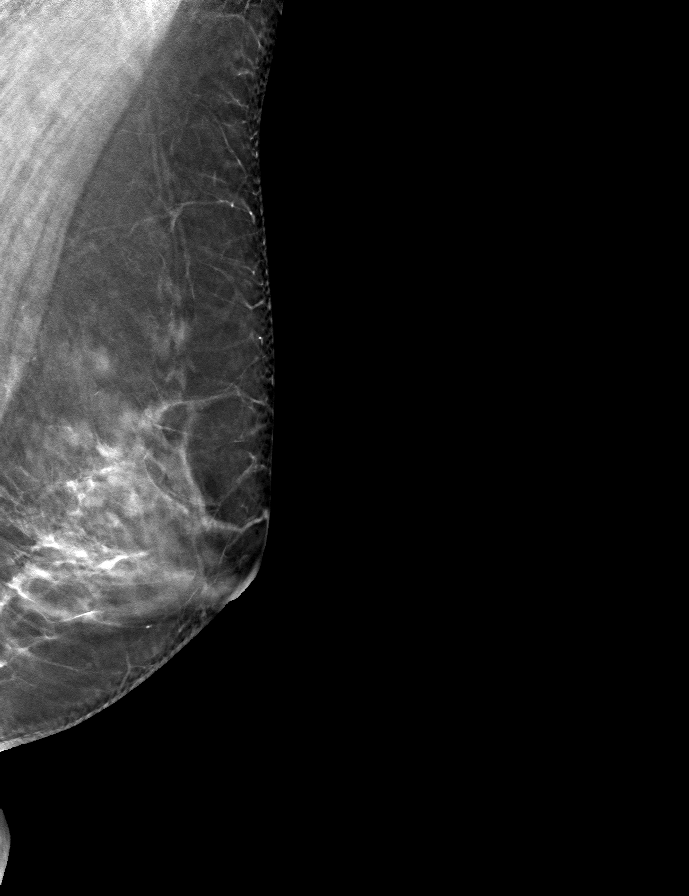

[R MLO tomo · tomo slice 36/71.0]
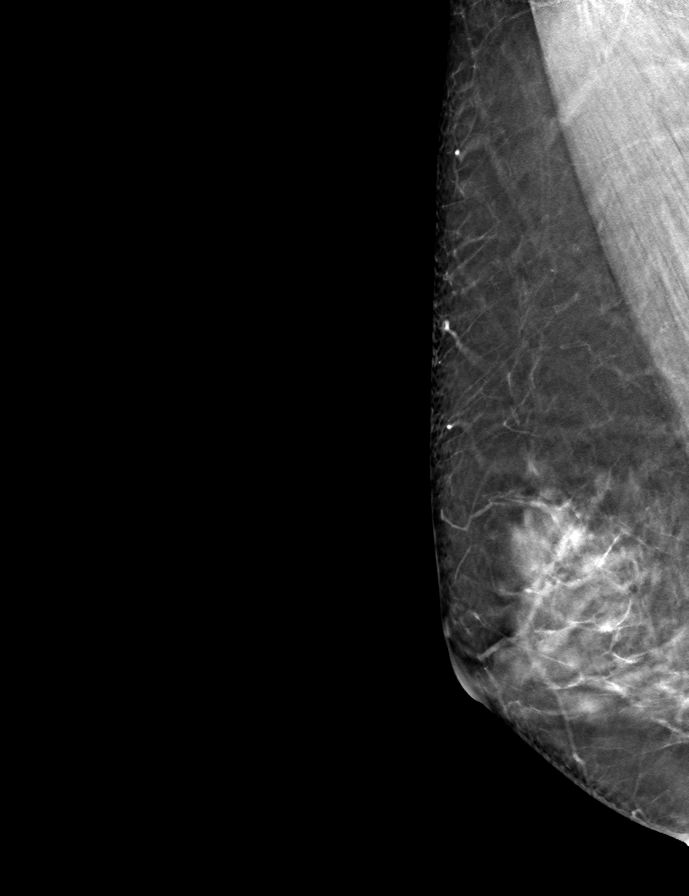

[L CC tomo · tomo slice 37/73.0]
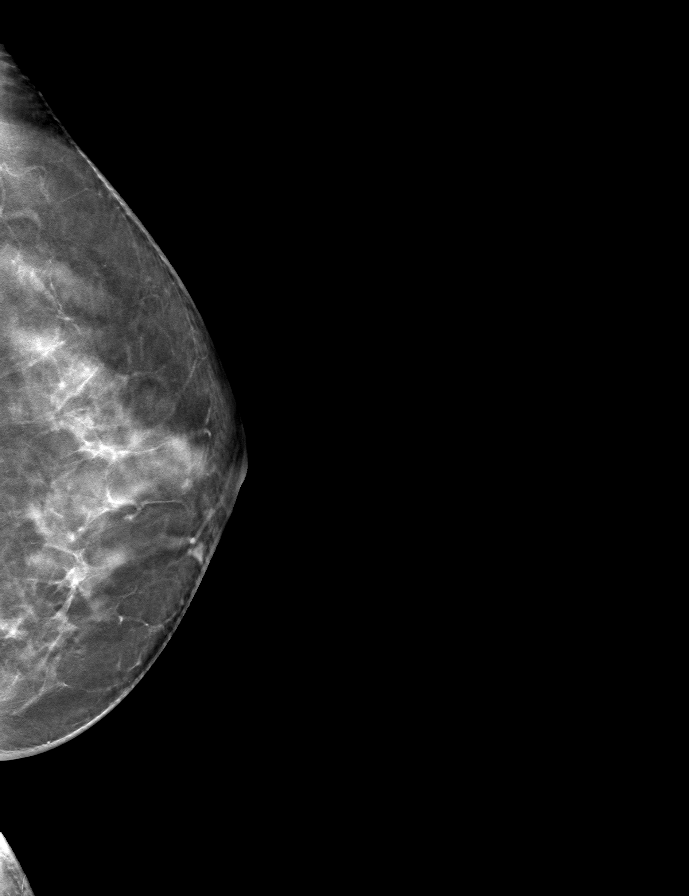

[9 of 24 positions shown; findings below may reference images not displayed]

ACR Breast Density Category d: The breast tissue is extremely dense,
which lowers the sensitivity of mammography
FINDINGS: There are no findings suspicious for malignancy.
IMPRESSION: No mammographic evidence of malignancy. A result letter of this
screening mammogram will be mailed directly to the patient.

RECOMMENDATION:
Screening mammogram in one year. (Code:[id number]-[id number])

BI-RADS CATEGORY  1: Negative.

## 2023-10-28 ENCOUNTER — Telehealth: Payer: Self-pay | Admitting: Family Medicine

## 2023-10-28 DIAGNOSIS — G4700 Insomnia, unspecified: Secondary | ICD-10-CM

## 2023-10-28 DIAGNOSIS — G8929 Other chronic pain: Secondary | ICD-10-CM

## 2023-10-28 MED ORDER — ZOLPIDEM TARTRATE 5 MG PO TABS: ORAL_TABLET | ORAL | 0 refills | Status: DC

## 2023-10-28 NOTE — Addendum Note (Signed)
 Addended by: Lindbergh Winkles on: 10/28/2023 06:04 PM   Modules accepted: Orders

## 2023-10-28 NOTE — Telephone Encounter (Signed)
 Copied from CRM [id number]. Topic: Clinical - Prescription Issue >> Oct 28, 2023  1:05 PM Everlene Hobby D wrote: Patient left town to go to [facility] for Child psychotherapist weekend and stayed at a guest house she got back home and have not been able to find the prescriptions - escitalopram (LEXAPRO) 5 MG tablet zolpidem (AMBIEN) 5 MG tablet She wants to know if she can get a prescription for these or what se needs to do.

## 2023-10-28 NOTE — Telephone Encounter (Signed)
 Copied from CRM [id number]. Topic: Clinical - Prescription Issue >> Oct 28, 2023  1:05 PM Everlene Hobby D wrote: Patient left town to go to [facility] for Child psychotherapist weekend and stayed at a guest house she got back home and have not been able to find the prescriptions - escitalopram (LEXAPRO) 5 MG tablet zolpidem (AMBIEN) 5 MG tablet She wants to know if she can get a prescription for these or what se needs to do.    Forwarding to Dr. Monnie Anthony. Please advise.

## 2023-10-29 NOTE — Addendum Note (Signed)
 Addended by: Charliene Conte A on: 10/29/2023 01:30 PM   Modules accepted: Orders

## 2023-11-05 ENCOUNTER — Other Ambulatory Visit: Payer: Self-pay | Admitting: *Deleted

## 2023-11-05 DIAGNOSIS — G8929 Other chronic pain: Secondary | ICD-10-CM

## 2023-11-20 ENCOUNTER — Encounter: Payer: Self-pay | Admitting: Family Medicine

## 2023-11-20 ENCOUNTER — Ambulatory Visit
Admission: RE | Admit: 2023-11-20 | Discharge: 2023-11-20 | Disposition: A | Discharge status: Home / Self Care | Payer: Self-pay | Source: Ambulatory Visit | Attending: Family Medicine | Admitting: Family Medicine

## 2023-11-20 DIAGNOSIS — M545 Low back pain, unspecified: Secondary | ICD-10-CM

## 2023-12-15 ENCOUNTER — Other Ambulatory Visit: Payer: Self-pay | Admitting: Family Medicine

## 2023-12-15 DIAGNOSIS — G4700 Insomnia, unspecified: Secondary | ICD-10-CM

## 2023-12-15 NOTE — Telephone Encounter (Signed)
 Spoke with patient and she did request this refill. She is going out of town and needs.

## 2023-12-15 NOTE — Telephone Encounter (Signed)
 Left message asking if she needs refill.

## 2023-12-15 NOTE — Telephone Encounter (Signed)
 Pt did request refill and is a little earlier but she will be OOT when refill due

## 2024-02-09 ENCOUNTER — Other Ambulatory Visit: Payer: Self-pay | Admitting: Family Medicine

## 2024-02-09 DIAGNOSIS — G4700 Insomnia, unspecified: Secondary | ICD-10-CM

## 2024-02-09 NOTE — Telephone Encounter (Signed)
 Patient did request this to be refilled.

## 2024-04-12 ENCOUNTER — Other Ambulatory Visit: Payer: Self-pay | Admitting: Family Medicine

## 2024-04-12 DIAGNOSIS — G4700 Insomnia, unspecified: Secondary | ICD-10-CM

## 2024-06-08 ENCOUNTER — Other Ambulatory Visit: Payer: Self-pay | Admitting: Family Medicine

## 2024-06-08 DIAGNOSIS — G4700 Insomnia, unspecified: Secondary | ICD-10-CM

## 2024-06-08 NOTE — Telephone Encounter (Signed)
 Is this okay to refill?

## 2024-08-03 ENCOUNTER — Other Ambulatory Visit: Payer: Self-pay | Admitting: Family Medicine

## 2024-08-03 DIAGNOSIS — G4700 Insomnia, unspecified: Secondary | ICD-10-CM

## 2024-08-03 NOTE — Telephone Encounter (Signed)
 Is this okay to refill?

## 2024-08-11 ENCOUNTER — Other Ambulatory Visit: Payer: Self-pay | Admitting: *Deleted

## 2024-08-11 DIAGNOSIS — F411 Generalized anxiety disorder: Secondary | ICD-10-CM

## 2024-08-11 MED ORDER — ALPRAZOLAM 0.25 MG PO TABS: 0.2500 mg | ORAL_TABLET | Freq: Three times a day (TID) | ORAL | 0 refills | Status: AC | PRN

## 2024-08-11 NOTE — Telephone Encounter (Signed)
 Last filled xanax  09/2022

## 2024-08-11 NOTE — Telephone Encounter (Signed)
 Called patient re: expiring labs, she never started low dose statin. She asked for a refill on xanax  to please be sent to [facility].

## 2024-10-14 ENCOUNTER — Ambulatory Visit: Payer: Self-pay | Admitting: Family Medicine
